# Patient Record
Sex: Male | Born: 1960 | Race: Black or African American | Hispanic: No | Marital: Single | Smoking: Current every day smoker
Health system: Southern US, Community
[De-identification: ages and names within clinical notes are randomized; demographics above are authoritative.]

## PROBLEM LIST (undated history)

## (undated) DIAGNOSIS — K219 Gastro-esophageal reflux disease without esophagitis: Secondary | ICD-10-CM

## (undated) DIAGNOSIS — H5440 Blindness, one eye, unspecified eye: Secondary | ICD-10-CM

## (undated) DIAGNOSIS — N200 Calculus of kidney: Secondary | ICD-10-CM

## (undated) DIAGNOSIS — E7800 Pure hypercholesterolemia, unspecified: Secondary | ICD-10-CM

## (undated) DIAGNOSIS — R531 Weakness: Secondary | ICD-10-CM

## (undated) DIAGNOSIS — I6930 Unspecified sequelae of cerebral infarction: Secondary | ICD-10-CM

## (undated) DIAGNOSIS — E785 Hyperlipidemia, unspecified: Secondary | ICD-10-CM

## (undated) DIAGNOSIS — J449 Chronic obstructive pulmonary disease, unspecified: Secondary | ICD-10-CM

## (undated) DIAGNOSIS — Z8739 Personal history of other diseases of the musculoskeletal system and connective tissue: Secondary | ICD-10-CM

## (undated) DIAGNOSIS — M1990 Unspecified osteoarthritis, unspecified site: Secondary | ICD-10-CM

## (undated) DIAGNOSIS — I639 Cerebral infarction, unspecified: Secondary | ICD-10-CM

## (undated) DIAGNOSIS — G479 Sleep disorder, unspecified: Secondary | ICD-10-CM

## (undated) DIAGNOSIS — I10 Essential (primary) hypertension: Secondary | ICD-10-CM

## (undated) DIAGNOSIS — H5450 Low vision, one eye, unspecified eye: Secondary | ICD-10-CM

## (undated) DIAGNOSIS — H5410 Blindness, one eye, low vision other eye, unspecified eyes: Secondary | ICD-10-CM

## (undated) DIAGNOSIS — C61 Malignant neoplasm of prostate: Secondary | ICD-10-CM

## (undated) DIAGNOSIS — Z87442 Personal history of urinary calculi: Secondary | ICD-10-CM

## (undated) HISTORY — PX: EYE SURGERY: SHX253

## (undated) HISTORY — PX: TOENAIL EXCISION: SUR558

---

## 2001-06-28 ENCOUNTER — Encounter: Payer: Self-pay | Admitting: *Deleted

## 2001-06-28 ENCOUNTER — Emergency Department (HOSPITAL_COMMUNITY): Admission: EM | Admit: 2001-06-28 | Discharge: 2001-06-28 | Payer: Self-pay | Admitting: Emergency Medicine

## 2002-10-12 ENCOUNTER — Encounter: Payer: Self-pay | Admitting: Emergency Medicine

## 2002-10-12 ENCOUNTER — Emergency Department (HOSPITAL_COMMUNITY): Admission: AD | Admit: 2002-10-12 | Discharge: 2002-10-12 | Payer: Self-pay | Admitting: Emergency Medicine

## 2005-03-01 DIAGNOSIS — I6930 Unspecified sequelae of cerebral infarction: Secondary | ICD-10-CM

## 2005-03-01 DIAGNOSIS — R531 Weakness: Secondary | ICD-10-CM

## 2005-03-01 HISTORY — DX: Unspecified sequelae of cerebral infarction: I69.30

## 2005-03-01 HISTORY — DX: Weakness: R53.1

## 2005-03-20 ENCOUNTER — Inpatient Hospital Stay (HOSPITAL_COMMUNITY): Admission: EM | Admit: 2005-03-20 | Discharge: 2005-04-29 | Payer: Self-pay | Admitting: Emergency Medicine

## 2005-03-20 ENCOUNTER — Ambulatory Visit: Payer: Self-pay | Admitting: Physical Medicine & Rehabilitation

## 2006-03-25 ENCOUNTER — Encounter: Admission: RE | Admit: 2006-03-25 | Discharge: 2006-06-10 | Payer: Self-pay | Admitting: Internal Medicine

## 2006-12-22 ENCOUNTER — Encounter: Admission: RE | Admit: 2006-12-22 | Discharge: 2007-02-06 | Payer: Self-pay | Admitting: Internal Medicine

## 2009-07-12 ENCOUNTER — Inpatient Hospital Stay (HOSPITAL_COMMUNITY): Admission: EM | Admit: 2009-07-12 | Discharge: 2009-07-18 | Payer: Self-pay | Admitting: Emergency Medicine

## 2010-09-16 LAB — URINE CULTURE: Colony Count: NO GROWTH

## 2010-09-16 LAB — BASIC METABOLIC PANEL
CO2: 22 mEq/L (ref 19–32)
Calcium: 8 mg/dL — ABNORMAL LOW (ref 8.4–10.5)
Calcium: 8.2 mg/dL — ABNORMAL LOW (ref 8.4–10.5)
Calcium: 8.3 mg/dL — ABNORMAL LOW (ref 8.4–10.5)
Chloride: 100 mEq/L (ref 96–112)
Creatinine, Ser: 5.09 mg/dL — ABNORMAL HIGH (ref 0.4–1.5)
GFR calc Af Amer: 12 mL/min — ABNORMAL LOW (ref >60)
GFR calc Af Amer: 15 mL/min — ABNORMAL LOW (ref >60)
GFR calc Af Amer: 22 mL/min — ABNORMAL LOW (ref >60)
GFR calc non Af Amer: 10 mL/min — ABNORMAL LOW (ref >60)
GFR calc non Af Amer: 18 mL/min — ABNORMAL LOW (ref >60)
Potassium: 4 mEq/L (ref 3.5–5.1)
Sodium: 135 mEq/L (ref 135–145)
Sodium: 135 mEq/L (ref 135–145)
Sodium: 136 mEq/L (ref 135–145)

## 2010-09-16 LAB — URINALYSIS, ROUTINE W REFLEX MICROSCOPIC
Ketones, ur: NEGATIVE mg/dL
Specific Gravity, Urine: 1.022 (ref 1.005–1.030)
pH: 5.5 (ref 5.0–8.0)

## 2010-09-16 LAB — CBC
Hemoglobin: 12.6 g/dL — ABNORMAL LOW (ref 13.0–17.0)
Hemoglobin: 13.5 g/dL (ref 13.0–17.0)
MCHC: 33.5 g/dL (ref 30.0–36.0)
MCV: 93 fL (ref 78.0–100.0)
Platelets: 144 10*3/uL — ABNORMAL LOW (ref 150–400)
RBC: 4.02 MIL/uL — ABNORMAL LOW (ref 4.22–5.81)
RDW: 16.6 % — ABNORMAL HIGH (ref 11.5–15.5)

## 2010-09-16 LAB — DIFFERENTIAL
Monocytes Relative: 2 % — ABNORMAL LOW (ref 3–12)
Neutro Abs: 12.5 10*3/uL — ABNORMAL HIGH (ref 1.7–7.7)
Neutrophils Relative %: 91 % — ABNORMAL HIGH (ref 43–77)

## 2010-09-16 LAB — CULTURE, BLOOD (ROUTINE X 2)
Culture: NO GROWTH
Culture: NO GROWTH

## 2010-09-16 LAB — PROTEIN, URINE, RANDOM: Total Protein, Urine: 51 mg/dL

## 2010-09-16 LAB — NA AND K (SODIUM & POTASSIUM), RAND UR
Potassium Urine: 26 mEq/L
Sodium, Ur: 62 mEq/L

## 2010-09-16 LAB — CREATININE, URINE, RANDOM: Creatinine, Urine: 88.1 mg/dL

## 2010-09-17 LAB — CBC
HCT: 34.6 % — ABNORMAL LOW (ref 39.0–52.0)
Hemoglobin: 11.6 g/dL — ABNORMAL LOW (ref 13.0–17.0)
MCHC: 33.8 g/dL (ref 30.0–36.0)
Platelets: 379 10*3/uL (ref 150–400)
RDW: 16.3 % — ABNORMAL HIGH (ref 11.5–15.5)
WBC: 13.6 10*3/uL — ABNORMAL HIGH (ref 4.0–10.5)
WBC: 15 10*3/uL — ABNORMAL HIGH (ref 4.0–10.5)

## 2010-09-17 LAB — BASIC METABOLIC PANEL
BUN: 21 mg/dL (ref 6–23)
BUN: 39 mg/dL — ABNORMAL HIGH (ref 6–23)
Chloride: 108 mEq/L (ref 96–112)
GFR calc non Af Amer: 29 mL/min — ABNORMAL LOW (ref >60)
Potassium: 3.6 mEq/L (ref 3.5–5.1)
Potassium: 3.7 mEq/L (ref 3.5–5.1)
Sodium: 136 mEq/L (ref 135–145)
Sodium: 138 mEq/L (ref 135–145)

## 2010-11-16 NOTE — H&P (Signed)
[name], [name]               ACCOUNT NO.:  [id number]   MEDICAL RECORD NO.:  [id number]          PATIENT TYPE:  INP   LOCATION:  2105                         FACILITY:  [facility]   PHYSICIAN:  Gustavus Messing. Orlin Hilding, M.D.DATE OF BIRTH:  1960-11-28   DATE OF ADMISSION:  03/20/2005  DATE OF DISCHARGE:                                HISTORY & PHYSICAL   CHIEF COMPLAINT:  Left-sided weakness and headache, onset about 11:30 code  stroke.   HISTORY OF PRESENT ILLNESS:  [name] is a 50 year old right-handed black  man with a history of hypertension no longer treated who was in his usual  state of health today around 11:30 a.m. said he was in the kitchen cooking  when he had sudden onset of right-sided headache, left hemiparesis, slurred  speech, and facial droop.  Review of systems positive for a headache,  negative for chest pain.   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  He  denies diabetes, coronary artery disease, or surgeries.   MEDICATIONS:  None.   ALLERGIES:  None.   SOCIAL HISTORY:  Does smoke at least a pack a day.  Does drink.  Remote drug  use.   FAMILY HISTORY:  Negative for stroke.   PHYSICAL EXAMINATION:  VITAL SIGNS:  Temperature 98.3, blood pressure  185/110, heart rate 77, 98% on 2 L.  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  NECK:  Supple without bruits, but laborious breathing obscures this.  HEART:  Regular rate and rhythm.  ABDOMEN:  Obese.  EXTREMITIES:  Without edema.  NEUROLOGIC:  He scores 21 on the NIHS stroke scale.  Level of consciousness:  He is drowsy.  He answers one out of two questions correctly.  Follows two  out of two commands correctly.  He has a right gaze preference, but can look  to the left.  He has a complete hemianopsia to the left in the visual  fields.  Facial:  He has a fairly complete  central facial droop, not a  peripheral.  Motor examination:  There is no movement left upper extremity,  no drift in the right upper extremity.  Gets four points on the right and  the left upper.  In the lower extremities no movement on the left.  He gets  a 4.  Some effort against gravity on the right.  He gets a 2.  For #7 ataxia  I cannot assess.  At 8 sensory there is a dense loss on the left.  He gets 2  points for that.  Language:  No evidence of an aphasia.  Dysarthria:  He  does have mild to moderate.  He gets a 1.  Neglect:  He does have partial  neglect.  Gets a 1 for that.  His total score is 21.   CT of the brain shows a 3-4 cm right putamenal capsular hemorrhage with some  impingement on the ventricular system which is small.  He does not have  much  in the way of atrophy.  He may have some other small vessel disease.  PT is  13.6, INR 1.  Creatinine 1, sodium 143, potassium 3.3, chloride 106, CO2  29.6, glucose is 100.   IMPRESSION:  Hypertensive right putamenal hemorrhage with deteriorating  status.  Discussed with Dr. Trey [name].  At the moment he is not a candidate  for surgery.  There is no hydrocephalus or herniation.   RECOMMENDATIONS:  Given the recent onset about two and a half hours ago and  the young age with rapid deterioration even though the hemorrhage is only 3-  4 cm I will give him NovoSeven 40 mcg/kg IV.  Will admit him to an ICU.  Full code for now.  There is no family to discuss code status with.  Will  control blood pressure with Cardene drip.  Recheck a CT in the morning.  There may be a role for CT intervention yet.      Catherine A. Orlin Hilding, M.D.  Electronically Signed     CAW/MEDQ  D:  03/20/2005  T:  03/20/2005  Job:  [id number]

## 2010-11-16 NOTE — Discharge Summary (Signed)
[name], [name]               ACCOUNT NO.:  [id number]   MEDICAL RECORD NO.:  [id number]          PATIENT TYPE:  INP   LOCATION:  3017                         FACILITY:  [facility]   PHYSICIAN:  Pramod P. Pearlean Brownie, MD    DATE OF BIRTH:  1960-12-27   DATE OF ADMISSION:  03/20/2005  DATE OF DISCHARGE:  04/29/2005                                 DISCHARGE SUMMARY   ADMITTING DIAGNOSES:  Left-sided weakness and headache.   DISCHARGE DIAGNOSES:  1.  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  2.  Dysphagia secondary to above.  3.  Hypertension.  4.  Obesity.  5.  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   HOSPITAL COURSE:  Kindly see Dr. Stark Klein excellent admission H&P  on March 20, 2005 for details of presentation.  [name] is a 50-year-  old African-American male with untreated hypertension.  Presented with  sudden onset of headache, left-sided weakness, slurred speech, and facial  droop.  Admission CT scan showed a large 3.8 x 3.1 cm right putamen basal  ganglia hemorrhage with midline shift of 2 mm and mass effect.  Patient's  care was discussed with neurosurgeon on-call, Dr. Trey Sailors, and he was not  considered a candidate for surgery.  He was hospitalized to the neurological  intensive care unit where he was kept for several days until he was stable.  Patient had presented within two and a half hours of onset of his symptoms  and was considered at risk for significant neurological worsening if he  rebled and hence he was given NovoSeven 40 mcg/kg IV one-time dose to  prevent rebleed.  He tolerated it without any problems.  His hemorrhage  remained stable.  He had several CT scans done serially at regular intervals  to document his stability of his hemorrhage.  He had significant dysarthria  and dysphagia and left-sided weakness.  He was seen on consultation by  physical, occupational, and speech therapy.  He had significant persistent  dysphagia.  He was  initially fed with a Panda tube but since his dysphagia  persisted beyond two weeks he was subsequently had PEG tube inserted for  feeding purposes.  The procedure was uneventful.  His blood pressure  remained high initially and it was treated with aggressive medications.  He  was seen on consult by PT, OT, and speech.  The patient was not considered a  good candidate for rehabilitation due to not having 24-hour supervision at  home.  He was considered for nursing home placement but given his young age  it took a long time to find a nursing home.  He remained stable throughout  the hospitalization stay.  At the time of discharge he was awake, alert.  His dysarthria had improved.  He still had facial droop.  He had grade 2/5  strength in the left upper extremity predominantly at the shoulders as well  as in the legs.  He has sensory loss on the left side.  He was discharged  home in stable condition to [facility] [place]   DISCHARGE  MEDICATIONS:  1.  Multivitamin once a day.  2.  Senokot twice a day.  3.  Potassium 20 mEq daily.  4.  Hydrochlorothiazide 50 mg a day.  5.  Clonidine 0.2 mg twice a day.  6.  Lovenox 40 mg q.24h. for DVT prophylaxis.  7.  Ensure 237 mL three times a day for nutritional supplement.  8.  Ultram 100 mg q.6h. p.r.n. for pain.  9.  Reglan 10 mg q.8h. for nausea and vomiting.  10. Tylenol p.r.n. for fever or pain.   He was advised to follow up with his medical doctor and with Annie Main,  nurse practitioner in Dr. Marlis Edelson office in two months.           ______________________________  Sunny Schlein. Pearlean Brownie, MD     PPS/MEDQ  D:  04/29/2005  T:  04/29/2005  Job:  [id number]

## 2012-08-19 ENCOUNTER — Other Ambulatory Visit (HOSPITAL_COMMUNITY): Payer: Self-pay | Admitting: Urology

## 2012-08-19 DIAGNOSIS — N200 Calculus of kidney: Secondary | ICD-10-CM

## 2012-08-25 ENCOUNTER — Encounter (HOSPITAL_COMMUNITY)
Admission: RE | Admit: 2012-08-25 | Discharge: 2012-08-25 | Disposition: A | Discharge status: Home / Self Care | Payer: Medicaid Other | Source: Ambulatory Visit | Attending: Urology | Admitting: Urology

## 2012-08-25 DIAGNOSIS — Z01818 Encounter for other preprocedural examination: Secondary | ICD-10-CM | POA: Insufficient documentation

## 2012-08-25 DIAGNOSIS — N200 Calculus of kidney: Secondary | ICD-10-CM | POA: Insufficient documentation

## 2012-08-25 MED ORDER — FUROSEMIDE 10 MG/ML IJ SOLN
40.0000 mg | Freq: Once | INTRAMUSCULAR | Status: AC
  Administered 2012-08-25: 41 mg via INTRAVENOUS
  Filled 2012-08-25: qty 4

## 2012-08-25 MED ORDER — TECHNETIUM TC 99M MERTIATIDE
15.0000 | Freq: Once | INTRAVENOUS | Status: AC | PRN
  Administered 2012-08-25: 15 via INTRAVENOUS

## 2012-09-07 ENCOUNTER — Other Ambulatory Visit: Payer: Self-pay | Admitting: Urology

## 2012-09-21 ENCOUNTER — Encounter (HOSPITAL_COMMUNITY): Payer: Self-pay | Admitting: Pharmacy Technician

## 2012-09-28 ENCOUNTER — Other Ambulatory Visit (HOSPITAL_COMMUNITY): Payer: Self-pay | Admitting: *Deleted

## 2012-09-29 ENCOUNTER — Encounter (HOSPITAL_COMMUNITY): Payer: Self-pay

## 2012-09-29 ENCOUNTER — Ambulatory Visit (HOSPITAL_COMMUNITY)
Admission: RE | Admit: 2012-09-29 | Discharge: 2012-09-29 | Disposition: A | Discharge status: Home / Self Care | Payer: Medicaid Other | Source: Ambulatory Visit | Attending: Urology | Admitting: Urology

## 2012-09-29 ENCOUNTER — Encounter (HOSPITAL_COMMUNITY)
Admission: RE | Admit: 2012-09-29 | Discharge: 2012-09-29 | Disposition: A | Discharge status: Home / Self Care | Payer: Medicaid Other | Source: Ambulatory Visit | Attending: Urology | Admitting: Urology

## 2012-09-29 DIAGNOSIS — Z01818 Encounter for other preprocedural examination: Secondary | ICD-10-CM | POA: Insufficient documentation

## 2012-09-29 DIAGNOSIS — Z87891 Personal history of nicotine dependence: Secondary | ICD-10-CM | POA: Insufficient documentation

## 2012-09-29 DIAGNOSIS — I10 Essential (primary) hypertension: Secondary | ICD-10-CM | POA: Insufficient documentation

## 2012-09-29 DIAGNOSIS — Z01812 Encounter for preprocedural laboratory examination: Secondary | ICD-10-CM | POA: Insufficient documentation

## 2012-09-29 DIAGNOSIS — J4489 Other specified chronic obstructive pulmonary disease: Secondary | ICD-10-CM | POA: Insufficient documentation

## 2012-09-29 DIAGNOSIS — J449 Chronic obstructive pulmonary disease, unspecified: Secondary | ICD-10-CM | POA: Insufficient documentation

## 2012-09-29 DIAGNOSIS — M4120 Other idiopathic scoliosis, site unspecified: Secondary | ICD-10-CM | POA: Insufficient documentation

## 2012-09-29 HISTORY — DX: Chronic obstructive pulmonary disease, unspecified: J44.9

## 2012-09-29 HISTORY — DX: Low vision, one eye, unspecified eye: H54.50

## 2012-09-29 HISTORY — DX: Essential (primary) hypertension: I10

## 2012-09-29 HISTORY — DX: Blindness, one eye, low vision other eye, unspecified eyes: H54.10

## 2012-09-29 HISTORY — DX: Cerebral infarction, unspecified: I63.9

## 2012-09-29 HISTORY — DX: Unspecified osteoarthritis, unspecified site: M19.90

## 2012-09-29 HISTORY — DX: Pure hypercholesterolemia, unspecified: E78.00

## 2012-09-29 HISTORY — DX: Blindness, one eye, unspecified eye: H54.40

## 2012-09-29 LAB — BASIC METABOLIC PANEL
BUN: 13 mg/dL (ref 6–23)
CO2: 30 mEq/L (ref 19–32)
GFR calc non Af Amer: 74 mL/min — ABNORMAL LOW (ref >90)
Glucose, Bld: 96 mg/dL (ref 70–99)
Potassium: 4.9 mEq/L (ref 3.5–5.1)
Sodium: 142 mEq/L (ref 135–145)

## 2012-09-29 LAB — CBC
HCT: 47.8 % (ref 39.0–52.0)
Hemoglobin: 15.6 g/dL (ref 13.0–17.0)
MCH: 30.1 pg (ref 26.0–34.0)
MCHC: 32.6 g/dL (ref 30.0–36.0)
MCV: 92.1 fL (ref 78.0–100.0)
RBC: 5.19 MIL/uL (ref 4.22–5.81)

## 2012-09-29 LAB — SURGICAL PCR SCREEN: MRSA, PCR: NEGATIVE

## 2012-09-29 NOTE — Progress Notes (Signed)
Faxed blood refusal to [facility] fax cofirmation received

## 2012-09-29 NOTE — Progress Notes (Addendum)
Medical clearance note dr Mila Palmer on chart Requested ekg dr Mila Palmer

## 2012-09-29 NOTE — Patient Instructions (Addendum)
20 [name]  09/29/2012   Your procedure is scheduled on: 10-05-2012  Report to [facility] at 0830 AM.  Call this number if you have problems the morning of surgery [phone]   Remember: call dr Concepcion Elk to get him to fax ekg to [phone] attention Prist[facility]   Do not eat food or drink liquids :After Midnight.     Take these medicines the morning of surgery with A SIP OF WATER: allopurinol, baclofen, certizine, proair nhaler if nneded and leave with your home health aide, omeprazole                                SEE [facility] PREPARING FOR SURGERY SHEET   Do not wear jewelry, make-up or nail polish.  Do not wear lotions, powders, or perfumes. You may wear deodorant.   Men may shave face and neck.  Do not bring valuables to the hospital.  Contacts, dentures or bridgework may not be worn into surgery.  Leave suitcase in the car. After surgery it may be brought to your room.  For patients admitted to the hospital, checkout time is 11:00 AM the day of discharge.   Patients discharged the day of surgery will not be allowed to drive home.  Name and phone number of your driver:  Special Instructions: N/A   Please read over the following fact sheets that you were given: MRSA Information.  Call Cain Sieve RN pre op nurse if needed [phone]    FAILURE TO FOLLOW THESE INSTRUCTIONS MAY RESULT IN THE CANCELLATION OF YOUR SURGERY. PATIENT SIGNATURE___________________________________________

## 2012-09-30 NOTE — Progress Notes (Signed)
ekg 09-02-2012 dr Concepcion Elk on chart

## 2012-10-05 ENCOUNTER — Encounter (HOSPITAL_COMMUNITY): Payer: Self-pay | Admitting: Registered Nurse

## 2012-10-05 ENCOUNTER — Ambulatory Visit (HOSPITAL_COMMUNITY): Payer: Medicaid Other | Admitting: Registered Nurse

## 2012-10-05 ENCOUNTER — Encounter (HOSPITAL_COMMUNITY)
Admission: RE | Disposition: A | Discharge status: Home / Self Care | Payer: Self-pay | Source: Ambulatory Visit | Attending: Urology

## 2012-10-05 ENCOUNTER — Ambulatory Visit (HOSPITAL_COMMUNITY): Payer: Medicaid Other

## 2012-10-05 ENCOUNTER — Inpatient Hospital Stay (HOSPITAL_COMMUNITY)
Admission: RE | Admit: 2012-10-05 | Discharge: 2012-10-08 | DRG: 660 | Disposition: A | Discharge status: Home / Self Care | Payer: Medicaid Other | Source: Ambulatory Visit | Attending: Urology | Admitting: Urology

## 2012-10-05 ENCOUNTER — Encounter (HOSPITAL_COMMUNITY): Payer: Self-pay | Admitting: *Deleted

## 2012-10-05 DIAGNOSIS — H547 Unspecified visual loss: Secondary | ICD-10-CM | POA: Diagnosis present

## 2012-10-05 DIAGNOSIS — N200 Calculus of kidney: Principal | ICD-10-CM | POA: Diagnosis present

## 2012-10-05 DIAGNOSIS — Z79899 Other long term (current) drug therapy: Secondary | ICD-10-CM

## 2012-10-05 DIAGNOSIS — Z87440 Personal history of urinary (tract) infections: Secondary | ICD-10-CM

## 2012-10-05 DIAGNOSIS — I10 Essential (primary) hypertension: Secondary | ICD-10-CM | POA: Diagnosis present

## 2012-10-05 DIAGNOSIS — J4489 Other specified chronic obstructive pulmonary disease: Secondary | ICD-10-CM | POA: Diagnosis present

## 2012-10-05 DIAGNOSIS — R5381 Other malaise: Secondary | ICD-10-CM | POA: Diagnosis present

## 2012-10-05 DIAGNOSIS — I69998 Other sequelae following unspecified cerebrovascular disease: Secondary | ICD-10-CM

## 2012-10-05 DIAGNOSIS — E860 Dehydration: Secondary | ICD-10-CM | POA: Diagnosis present

## 2012-10-05 DIAGNOSIS — E7800 Pure hypercholesterolemia, unspecified: Secondary | ICD-10-CM | POA: Diagnosis present

## 2012-10-05 DIAGNOSIS — F17200 Nicotine dependence, unspecified, uncomplicated: Secondary | ICD-10-CM | POA: Diagnosis present

## 2012-10-05 DIAGNOSIS — J449 Chronic obstructive pulmonary disease, unspecified: Secondary | ICD-10-CM | POA: Diagnosis present

## 2012-10-05 DIAGNOSIS — N1330 Unspecified hydronephrosis: Secondary | ICD-10-CM | POA: Diagnosis present

## 2012-10-05 HISTORY — PX: NEPHROLITHOTOMY: SHX5134

## 2012-10-05 HISTORY — PX: CYSTOSCOPY: SHX5120

## 2012-10-05 LAB — BASIC METABOLIC PANEL
CO2: 24 mEq/L (ref 19–32)
Chloride: 102 mEq/L (ref 96–112)
Creatinine, Ser: 0.97 mg/dL (ref 0.50–1.35)
GFR calc Af Amer: >90 mL/min (ref >90)
Potassium: 3.9 mEq/L (ref 3.5–5.1)
Sodium: 136 mEq/L (ref 135–145)

## 2012-10-05 LAB — HEMOGLOBIN AND HEMATOCRIT, BLOOD
HCT: 44.6 % (ref 39.0–52.0)
Hemoglobin: 15.1 g/dL (ref 13.0–17.0)

## 2012-10-05 LAB — NO BLOOD PRODUCTS

## 2012-10-05 SURGERY — NEPHROLITHOTOMY PERCUTANEOUS
Anesthesia: General | Laterality: Right | Wound class: Clean Contaminated

## 2012-10-05 MED ORDER — NEOSTIGMINE METHYLSULFATE 1 MG/ML IJ SOLN
INTRAMUSCULAR | Status: DC | PRN
  Administered 2012-10-05: 5 mg via INTRAVENOUS

## 2012-10-05 MED ORDER — IOHEXOL 300 MG/ML  SOLN
INTRAMUSCULAR | Status: AC
  Filled 2012-10-05: qty 2

## 2012-10-05 MED ORDER — OXYCODONE HCL 5 MG PO TABS
5.0000 mg | ORAL_TABLET | ORAL | Status: DC | PRN
  Administered 2012-10-08: 5 mg via ORAL
  Filled 2012-10-05: qty 1

## 2012-10-05 MED ORDER — FENTANYL CITRATE 0.05 MG/ML IJ SOLN
INTRAMUSCULAR | Status: DC | PRN
  Administered 2012-10-05: 100 ug via INTRAVENOUS
  Administered 2012-10-05 (×3): 50 ug via INTRAVENOUS

## 2012-10-05 MED ORDER — DOCUSATE SODIUM 100 MG PO CAPS
100.0000 mg | ORAL_CAPSULE | Freq: Two times a day (BID) | ORAL | Status: DC
  Administered 2012-10-05 – 2012-10-08 (×5): 100 mg via ORAL
  Filled 2012-10-05 (×8): qty 1

## 2012-10-05 MED ORDER — ALLOPURINOL 300 MG PO TABS
300.0000 mg | ORAL_TABLET | Freq: Every morning | ORAL | Status: DC
  Administered 2012-10-06 – 2012-10-08 (×2): 300 mg via ORAL
  Filled 2012-10-05 (×3): qty 1

## 2012-10-05 MED ORDER — SENNA 8.6 MG PO TABS
1.0000 | ORAL_TABLET | Freq: Two times a day (BID) | ORAL | Status: DC
  Administered 2012-10-05 – 2012-10-08 (×5): 8.6 mg via ORAL
  Filled 2012-10-05 (×5): qty 1

## 2012-10-05 MED ORDER — ONDANSETRON HCL 4 MG/2ML IJ SOLN
INTRAMUSCULAR | Status: DC | PRN
  Administered 2012-10-05: 4 mg via INTRAVENOUS

## 2012-10-05 MED ORDER — PROPOFOL 10 MG/ML IV BOLUS
INTRAVENOUS | Status: DC | PRN
  Administered 2012-10-05: 200 mg via INTRAVENOUS

## 2012-10-05 MED ORDER — ACETAMINOPHEN 10 MG/ML IV SOLN
1000.0000 mg | Freq: Four times a day (QID) | INTRAVENOUS | Status: AC
  Administered 2012-10-05 – 2012-10-06 (×4): 1000 mg via INTRAVENOUS
  Filled 2012-10-05 (×4): qty 100

## 2012-10-05 MED ORDER — CIPROFLOXACIN IN D5W 400 MG/200ML IV SOLN
INTRAVENOUS | Status: AC
  Filled 2012-10-05: qty 200

## 2012-10-05 MED ORDER — KCL IN DEXTROSE-NACL 20-5-0.45 MEQ/L-%-% IV SOLN
INTRAVENOUS | Status: DC
  Administered 2012-10-05 – 2012-10-08 (×6): via INTRAVENOUS
  Filled 2012-10-05 (×7): qty 1000

## 2012-10-05 MED ORDER — HYDROMORPHONE HCL PF 1 MG/ML IJ SOLN
0.5000 mg | INTRAMUSCULAR | Status: DC | PRN
  Administered 2012-10-06 – 2012-10-07 (×4): 1 mg via INTRAVENOUS
  Filled 2012-10-05 (×5): qty 1

## 2012-10-05 MED ORDER — PNEUMOCOCCAL VAC POLYVALENT 25 MCG/0.5ML IJ INJ
0.5000 mL | INJECTION | INTRAMUSCULAR | Status: AC
  Administered 2012-10-06: 0.5 mL via INTRAMUSCULAR
  Filled 2012-10-05 (×2): qty 0.5

## 2012-10-05 MED ORDER — SIMVASTATIN 20 MG PO TABS
20.0000 mg | ORAL_TABLET | Freq: Every day | ORAL | Status: DC
  Administered 2012-10-05 – 2012-10-07 (×3): 20 mg via ORAL
  Filled 2012-10-05 (×4): qty 1

## 2012-10-05 MED ORDER — DEXAMETHASONE SODIUM PHOSPHATE 10 MG/ML IJ SOLN
INTRAMUSCULAR | Status: DC | PRN
  Administered 2012-10-05: 10 mg via INTRAVENOUS

## 2012-10-05 MED ORDER — HYDROMORPHONE HCL PF 1 MG/ML IJ SOLN
INTRAMUSCULAR | Status: AC
  Administered 2012-10-05: 1 mg via INTRAVENOUS
  Filled 2012-10-05: qty 1

## 2012-10-05 MED ORDER — LORATADINE 10 MG PO TABS: 10.0000 mg | ORAL_TABLET | Freq: Every day | ORAL | Status: DC

## 2012-10-05 MED ORDER — IOHEXOL 300 MG/ML  SOLN
INTRAMUSCULAR | Status: AC
  Filled 2012-10-05: qty 1

## 2012-10-05 MED ORDER — COLCHICINE 0.6 MG PO TABS
0.6000 mg | ORAL_TABLET | Freq: Every day | ORAL | Status: DC
  Administered 2012-10-05 – 2012-10-08 (×3): 0.6 mg via ORAL
  Filled 2012-10-05 (×4): qty 1

## 2012-10-05 MED ORDER — HYDROCHLOROTHIAZIDE 12.5 MG PO CAPS
12.5000 mg | ORAL_CAPSULE | Freq: Every day | ORAL | Status: DC
  Administered 2012-10-05 – 2012-10-08 (×3): 12.5 mg via ORAL
  Filled 2012-10-05 (×4): qty 1

## 2012-10-05 MED ORDER — STERILE WATER FOR IRRIGATION IR SOLN
Status: DC | PRN
  Administered 2012-10-05: 3000 mL

## 2012-10-05 MED ORDER — DEXTROSE 5 % IV SOLN
1.0000 g | INTRAVENOUS | Status: DC
  Administered 2012-10-06 – 2012-10-07 (×2): 1 g via INTRAVENOUS
  Filled 2012-10-05 (×3): qty 10

## 2012-10-05 MED ORDER — GLYCOPYRROLATE 0.2 MG/ML IJ SOLN
INTRAMUSCULAR | Status: DC | PRN
  Administered 2012-10-05: .8 mg via INTRAVENOUS

## 2012-10-05 MED ORDER — ALBUTEROL SULFATE HFA 108 (90 BASE) MCG/ACT IN AERS: 2.0000 | INHALATION_SPRAY | RESPIRATORY_TRACT | Status: DC | PRN

## 2012-10-05 MED ORDER — BACLOFEN 10 MG PO TABS
10.0000 mg | ORAL_TABLET | Freq: Three times a day (TID) | ORAL | Status: DC
  Administered 2012-10-05 – 2012-10-08 (×7): 10 mg via ORAL
  Filled 2012-10-05 (×10): qty 1

## 2012-10-05 MED ORDER — ROCURONIUM BROMIDE 100 MG/10ML IV SOLN
INTRAVENOUS | Status: DC | PRN
  Administered 2012-10-05: 30 mg via INTRAVENOUS
  Administered 2012-10-05: 50 mg via INTRAVENOUS

## 2012-10-05 MED ORDER — CIPROFLOXACIN IN D5W 400 MG/200ML IV SOLN
400.0000 mg | INTRAVENOUS | Status: AC
  Administered 2012-10-05: 400 mg via INTRAVENOUS

## 2012-10-05 MED ORDER — EPHEDRINE SULFATE 50 MG/ML IJ SOLN
INTRAMUSCULAR | Status: DC | PRN
  Administered 2012-10-05: 5 mg via INTRAVENOUS

## 2012-10-05 MED ORDER — ACETAMINOPHEN 10 MG/ML IV SOLN
INTRAVENOUS | Status: DC | PRN
  Administered 2012-10-05: 1000 mg via INTRAVENOUS

## 2012-10-05 MED ORDER — PHENYLEPHRINE HCL 10 MG/ML IJ SOLN
INTRAMUSCULAR | Status: DC | PRN
  Administered 2012-10-05 (×2): 40 ug via INTRAVENOUS

## 2012-10-05 MED ORDER — CETIRIZINE HCL 1 MG/ML PO SYRP: 10.0000 mg | ORAL_SOLUTION | Freq: Every morning | ORAL | Status: DC

## 2012-10-05 MED ORDER — MIDAZOLAM HCL 5 MG/5ML IJ SOLN
INTRAMUSCULAR | Status: DC | PRN
  Administered 2012-10-05: 2 mg via INTRAVENOUS

## 2012-10-05 MED ORDER — LACTATED RINGERS IV SOLN
INTRAVENOUS | Status: DC | PRN
  Administered 2012-10-05: 10:00:00 via INTRAVENOUS

## 2012-10-05 MED ORDER — HYDROMORPHONE HCL PF 1 MG/ML IJ SOLN
0.2500 mg | INTRAMUSCULAR | Status: DC | PRN
  Administered 2012-10-05 (×2): 0.5 mg via INTRAVENOUS

## 2012-10-05 MED ORDER — PANTOPRAZOLE SODIUM 40 MG PO TBEC
40.0000 mg | DELAYED_RELEASE_TABLET | Freq: Every day | ORAL | Status: DC
  Administered 2012-10-05 – 2012-10-08 (×3): 40 mg via ORAL
  Filled 2012-10-05 (×4): qty 1

## 2012-10-05 MED ORDER — SODIUM CHLORIDE 0.9 % IR SOLN
Status: DC | PRN
  Administered 2012-10-05: 12000 mL

## 2012-10-05 MED ORDER — KETOROLAC TROMETHAMINE 30 MG/ML IJ SOLN: 15.0000 mg | Freq: Once | INTRAMUSCULAR | Status: DC | PRN

## 2012-10-05 MED ORDER — LIDOCAINE HCL (CARDIAC) 20 MG/ML IV SOLN
INTRAVENOUS | Status: DC | PRN
  Administered 2012-10-05: 100 mg via INTRAVENOUS

## 2012-10-05 MED ORDER — IRBESARTAN 300 MG PO TABS
300.0000 mg | ORAL_TABLET | Freq: Every day | ORAL | Status: DC
  Administered 2012-10-05 – 2012-10-08 (×3): 300 mg via ORAL
  Filled 2012-10-05 (×4): qty 1

## 2012-10-05 MED ORDER — PROMETHAZINE HCL 25 MG/ML IJ SOLN: 6.2500 mg | INTRAMUSCULAR | Status: DC | PRN

## 2012-10-05 MED ORDER — INDIGOTINDISULFONATE SODIUM 8 MG/ML IJ SOLN
INTRAMUSCULAR | Status: AC
  Filled 2012-10-05: qty 5

## 2012-10-05 MED ORDER — DEXTROSE 5 % IV SOLN
1.0000 g | Freq: Once | INTRAVENOUS | Status: AC
  Administered 2012-10-05: 1 g via INTRAVENOUS
  Filled 2012-10-05: qty 10

## 2012-10-05 MED ORDER — ONDANSETRON HCL 4 MG/2ML IJ SOLN
4.0000 mg | INTRAMUSCULAR | Status: DC | PRN
  Administered 2012-10-07: 4 mg via INTRAVENOUS
  Filled 2012-10-05 (×2): qty 2

## 2012-10-05 MED ORDER — CETIRIZINE HCL 10 MG PO TABS
10.0000 mg | ORAL_TABLET | Freq: Every day | ORAL | Status: DC
  Administered 2012-10-06 – 2012-10-08 (×2): 10 mg via ORAL
  Filled 2012-10-05 (×4): qty 1

## 2012-10-05 MED ORDER — VALSARTAN-HYDROCHLOROTHIAZIDE 320-12.5 MG PO TABS: 1.0000 | ORAL_TABLET | Freq: Every day | ORAL | Status: DC

## 2012-10-05 MED ORDER — ACETAMINOPHEN 10 MG/ML IV SOLN
INTRAVENOUS | Status: AC
  Filled 2012-10-05: qty 100

## 2012-10-05 MED ORDER — IOHEXOL 300 MG/ML  SOLN
INTRAMUSCULAR | Status: DC | PRN
  Administered 2012-10-05: 10 mL

## 2012-10-05 SURGICAL SUPPLY — 62 items
APL SKNCLS STERI-STRIP NONHPOA (GAUZE/BANDAGES/DRESSINGS) ×2
BAG URINE DRAINAGE (UROLOGICAL SUPPLIES) ×3 IMPLANT
BAG URO CATCHER STRL LF (DRAPE) ×3 IMPLANT
BASKET ZERO TIP NITINOL 2.4FR (BASKET) ×3 IMPLANT
BENZOIN TINCTURE PRP APPL 2/3 (GAUZE/BANDAGES/DRESSINGS) ×5 IMPLANT
BLADE SURG 15 STRL LF DISP TIS (BLADE) ×2 IMPLANT
BLADE SURG 15 STRL SS (BLADE) ×3
BSKT STON RTRVL ZERO TP 2.4FR (BASKET) ×2
CATCHER STONE W/TUBE ADAPTER (UROLOGICAL SUPPLIES) ×2 IMPLANT
CATH BEACON 5.038 65CM KMP-01 (CATHETERS) ×2 IMPLANT
CATH FOLEY 2W COUNCIL 20FR 5CC (CATHETERS) IMPLANT
CATH FOLEY 2WAY SLVR  5CC 16FR (CATHETERS) ×1
CATH FOLEY 2WAY SLVR 5CC 16FR (CATHETERS) ×2 IMPLANT
CATH INTERMIT  6FR 70CM (CATHETERS) ×3 IMPLANT
CATH MULTI PURPOSE 16FR DRAIN (STENTS) ×1 IMPLANT
CATH ROBINSON RED A/P 20FR (CATHETERS) IMPLANT
CATH X-FORCE N30 NEPHROSTOMY (TUBING) ×3 IMPLANT
CLOTH BEACON ORANGE TIMEOUT ST (SAFETY) ×3 IMPLANT
COVER SURGICAL LIGHT HANDLE (MISCELLANEOUS) ×3 IMPLANT
DRAPE C-ARM 42X72 X-RAY (DRAPES) ×3 IMPLANT
DRAPE CAMERA CLOSED 9X96 (DRAPES) ×6 IMPLANT
DRAPE LG THREE QUARTER DISP (DRAPES) ×1 IMPLANT
DRAPE LINGEMAN PERC (DRAPES) ×3 IMPLANT
DRAPE SURG IRRIG POUCH 19X23 (DRAPES) ×3 IMPLANT
DRSG PAD ABDOMINAL 8X10 ST (GAUZE/BANDAGES/DRESSINGS) ×1 IMPLANT
DRSG TEGADERM 8X12 (GAUZE/BANDAGES/DRESSINGS) ×5 IMPLANT
GLOVE BIOGEL M 8.0 STRL (GLOVE) ×3 IMPLANT
GLOVE BIOGEL M STRL SZ7.5 (GLOVE) ×3 IMPLANT
GOWN STRL NON-REIN LRG LVL3 (GOWN DISPOSABLE) ×6 IMPLANT
GOWN STRL REIN XL XLG (GOWN DISPOSABLE) ×9 IMPLANT
GUIDEWIRE AMPLAZ .035X145 (WIRE) ×3 IMPLANT
GUIDEWIRE ANG ZIPWIRE 038X150 (WIRE) ×1 IMPLANT
KIT BASIN OR (CUSTOM PROCEDURE TRAY) ×3 IMPLANT
LASER FIBER DISP (UROLOGICAL SUPPLIES) ×1 IMPLANT
LASER FIBER DISP 1000U (UROLOGICAL SUPPLIES) IMPLANT
MANIFOLD NEPTUNE II (INSTRUMENTS) ×3 IMPLANT
NDL TROCAR 18X15 ECHO (NEEDLE) IMPLANT
NEEDLE TROCAR 18X15 ECHO (NEEDLE) ×3 IMPLANT
NS IRRIG 1000ML POUR BTL (IV SOLUTION) ×2 IMPLANT
PACK BASIC VI WITH GOWN DISP (CUSTOM PROCEDURE TRAY) ×3 IMPLANT
PACK CYSTO (CUSTOM PROCEDURE TRAY) ×3 IMPLANT
PAD ABD 7.5X8 STRL (GAUZE/BANDAGES/DRESSINGS) ×6 IMPLANT
PROBE LITHOCLAST ULTRA 3.8X403 (UROLOGICAL SUPPLIES) IMPLANT
PROBE PNEUMATIC 1.0MMX570MM (UROLOGICAL SUPPLIES) ×2 IMPLANT
SET IRRIG Y TYPE TUR BLADDER L (SET/KITS/TRAYS/PACK) ×3 IMPLANT
SET WARMING FLUID IRRIGATION (MISCELLANEOUS) ×2 IMPLANT
SHEATH PEELAWAY SET 9 (SHEATH) ×1 IMPLANT
SPONGE GAUZE 4X4 12PLY (GAUZE/BANDAGES/DRESSINGS) ×3 IMPLANT
SPONGE LAP 4X18 X RAY DECT (DISPOSABLE) ×3 IMPLANT
STONE CATCHER W/TUBE ADAPTER (UROLOGICAL SUPPLIES) IMPLANT
SUT ETHILON 3 0 PS 1 (SUTURE) ×1 IMPLANT
SUT SILK 2 0 30  PSL (SUTURE) ×1
SUT SILK 2 0 30 PSL (SUTURE) ×2 IMPLANT
SUT VIC AB 2-0 UR6 27 (SUTURE) ×1 IMPLANT
SUT VICRYL 0 UR6 27IN ABS (SUTURE) ×1 IMPLANT
SYR 20CC LL (SYRINGE) ×6 IMPLANT
SYRINGE 10CC LL (SYRINGE) ×3 IMPLANT
TRAY FOLEY BAG SILVER LF 14FR (CATHETERS) ×2 IMPLANT
TRAY FOLEY CATH 14FRSI W/METER (CATHETERS) ×3 IMPLANT
TUBE CONNECTING VINYL 14FR 30C (MISCELLANEOUS) ×1 IMPLANT
TUBING CONNECTING 10 (TUBING) ×8 IMPLANT
WATER STERILE IRR 1500ML POUR (IV SOLUTION) ×2 IMPLANT

## 2012-10-05 NOTE — H&P (Signed)
[name] is an 52 y.o. male.    Chief Complaint: Pre-Op Rt Multistage  Percutanoues Nephrostolithotomy   HPI:   1 - Rt Staghorn Kidney Stone - Pt wtih approx 4cm volume of Rt partial staghorn stone, 500HU, SSD 11 cm involving rt mid and lower poles by CT 07/2012 found on w/u of recurrent UTI. No prior stone episodes.  2 - Recurrent UTI - pt with multiple recent UTI's. Partial staghorn stone as per above. 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 PVR 08/2012 "44mL".  Most Recent UCX's with e. coli and strep sens to cephalosporins (Keflex, Rocephin), but variably sensitive to others.  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 No interval fevers. He took CX-specific ABX as prescribed.  Past Medical History  Diagnosis Date  . COPD (chronic obstructive pulmonary disease)   . Hypertension   . Stroke sept 26,  20006     hx mini strokes in past, 1 large stroke  . Arthritis   . High cholesterol   . 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 Left ey surgery  age 72    No family history on file. Social History:  reports that he has been smoking Cigarettes.  He has a 7.5 pack-year smoking history. He has never used smokeless tobacco. He reports that he does not drink alcohol or use illicit drugs.  Allergies: No Known Allergies  No prescriptions prior to admission    No results found for this or any previous visit (from the past 48 hour(s)). No results found.  Review of Systems  Constitutional: Negative.  Negative for fever and chills.  HENT: Negative.   Eyes: Negative.   Respiratory: Negative.   Cardiovascular: Negative.   Gastrointestinal: Negative.   Genitourinary: Negative.   Musculoskeletal: Negative.   Skin: Negative.   Neurological: Negative.    Endo/Heme/Allergies: Negative.   Psychiatric/Behavioral: Negative.     There were no vitals taken for this visit. Physical Exam  Constitutional: He is oriented to person, place, and time. He appears well-developed and well-nourished.  obese  HENT:  Head: Normocephalic and atraumatic.  Eyes: EOM are normal. Pupils are equal, round, and reactive to light.  Neck: Normal range of motion. Neck supple.  Cardiovascular: Normal rate.   Respiratory: Effort normal.  GI: Soft. Bowel sounds are normal.  Genitourinary: Penis normal.  No CVAT  Musculoskeletal: Normal range of motion.  Neurological: He is alert and oriented to person, place, and time.  Skin: Skin is warm and dry.  Psychiatric: He has a normal mood and affect. His behavior is normal. Judgment and thought content normal.     Assessment/Plan   1 - Rt Staghorn Kidney Stone -  We re-discussed percutaneous nephrostolithotomy (PCNL) in detail including need for percutaneous access which is sometimes gained by the surgeon, and other times by radiology or through existing nephrostomy tubes if present. We specifically discussed that often times tubes remain in after surgery until we are confident all stone has been treated. We mentioned that staged surgery is needed in over 50% of cases of very large or complex stone. We then discussed general risks including bleeding, infection, damage to kidney / ureter / bladder, loss of kidney, as well as anesthetic    2 -  Recurrent UTI - stone as potential nidus. Will place on peri-op Rocephin according to most recent CX's.   Eulonda Andalon 10/05/2012, 7:26 AM

## 2012-10-05 NOTE — Brief Op Note (Signed)
10/05/2012  2:23 PM  PATIENT:  [name]  52 y.o. male  PRE-OPERATIVE DIAGNOSIS:  RIGHT STAGHORN KIDNEY STONE  POST-OPERATIVE DIAGNOSIS:  RIGHT STAGHORN KIDNEY STONE  PROCEDURE:  Procedure(s) with comments: NEPHROLITHOTOMY PERCUTANEOUS (Right) - WITH CYSTO AND SURGEON ACCESS  CYSTOSCOPY, retrograde and right stent placement (N/A)  SURGEON:  Surgeon(s) and Role:    * [name], MD - Primary  PHYSICIAN ASSISTANT:   ASSISTANTS: none   ANESTHESIA:   general  EBL:  Total I/O In: 2000 [I.V.:2000] Out: 100 [Blood:100]  BLOOD ADMINISTERED:none  DRAINS: 1 - Foley, 2 - Rt nephrostomy, 3 - Rt nephroureteral stent (capped)   LOCAL MEDICATIONS USED:  NONE  SPECIMEN:  Source of Specimen:  Rt Renal Stones  DISPOSITION OF SPECIMEN:  1 - to patient, 2 - [facility] for Compositional Analysis  COUNTS:  YES  TOURNIQUET:  * No tourniquets in log *  DICTATION: .Other Dictation: Dictation Number [id number]  PLAN OF CARE: Admit to inpatient   PATIENT DISPOSITION:  PACU - hemodynamically stable.   Delay start of Pharmacological VTE agent (>24hrs) due to surgical blood loss or risk of bleeding: yes

## 2012-10-05 NOTE — Preoperative (Signed)
Beta Blockers   Reason not to administer Beta Blockers:Not Applicable 

## 2012-10-05 NOTE — Anesthesia Preprocedure Evaluation (Addendum)
Anesthesia Evaluation  Patient identified by MRN, date of birth, ID band Patient awake    Reviewed: Allergy & Precautions, H&P , NPO status , Patient's Chart, lab work & pertinent test results  Airway Mallampati: III TM Distance: <3 FB Neck ROM: Full    Dental no notable dental hx.    Pulmonary COPD COPD inhaler, Current Smoker,  breath sounds clear to auscultation  Pulmonary exam normal       Cardiovascular hypertension, Pt. on medications Rhythm:Regular Rate:Normal     Neuro/Psych CVA negative psych ROS   GI/Hepatic negative GI ROS, Neg liver ROS,   Endo/Other  negative endocrine ROS  Renal/GU negative Renal ROS  negative genitourinary   Musculoskeletal negative musculoskeletal ROS (+)   Abdominal   Peds negative pediatric ROS (+)  Hematology negative hematology ROS (+)   Anesthesia Other Findings   Reproductive/Obstetrics negative OB ROS                           Anesthesia Physical Anesthesia Plan  ASA: III  Anesthesia Plan: General   Post-op Pain Management:    Induction: Intravenous  Airway Management Planned: Oral ETT  Additional Equipment:   Intra-op Plan:   Post-operative Plan: Extubation in OR  Informed Consent: I have reviewed the patients History and Physical, chart, labs and discussed the procedure including the risks, benefits and alternatives for the proposed anesthesia with the patient or authorized representative who has indicated his/her understanding and acceptance.   Dental advisory given  Plan Discussed with: CRNA and Surgeon  Anesthesia Plan Comments:         Anesthesia Quick Evaluation

## 2012-10-05 NOTE — Anesthesia Postprocedure Evaluation (Signed)
  Anesthesia Post-op Note  Patient: [name]  Procedure(s) Performed: Procedure(s) (LRB): NEPHROLITHOTOMY PERCUTANEOUS (Right) CYSTOSCOPY, retrograde and right stent placement (N/A)  Patient Location: PACU  Anesthesia Type: General  Level of Consciousness: awake and alert   Airway and Oxygen Therapy: Patient Spontanous Breathing  Post-op Pain: mild  Post-op Assessment: Post-op Vital signs reviewed, Patient's Cardiovascular Status Stable, Respiratory Function Stable, Patent Airway and No signs of Nausea or vomiting  Last Vitals:  Filed Vitals:   10/05/12 0828  BP: 141/83  Pulse: 87  Temp: 36.5 C  Resp: 18    Post-op Vital Signs: stable   Complications: No apparent anesthesia complications

## 2012-10-05 NOTE — Transfer of Care (Signed)
Immediate Anesthesia Transfer of Care Note  Patient: [name]  Procedure(s) Performed: Procedure(s) with comments: NEPHROLITHOTOMY PERCUTANEOUS (Right) - WITH CYSTO AND SURGEON ACCESS  CYSTOSCOPY, retrograde and right stent placement (N/A)  Patient Location: PACU  Anesthesia Type:General  Level of Consciousness: awake, alert , oriented and patient cooperative  Airway & Oxygen Therapy: Patient Spontanous Breathing and Patient connected to face mask oxygen  Post-op Assessment: Report given to PACU RN, Post -op Vital signs reviewed and stable and Patient moving all extremities  Post vital signs: Reviewed and stable  Complications: No apparent anesthesia complications

## 2012-10-06 ENCOUNTER — Encounter (HOSPITAL_COMMUNITY): Payer: Self-pay | Admitting: Urology

## 2012-10-06 LAB — BASIC METABOLIC PANEL
BUN: 15 mg/dL (ref 6–23)
Chloride: 104 mEq/L (ref 96–112)
GFR calc Af Amer: 74 mL/min — ABNORMAL LOW (ref >90)
GFR calc non Af Amer: 64 mL/min — ABNORMAL LOW (ref >90)
Potassium: 4.3 mEq/L (ref 3.5–5.1)

## 2012-10-06 LAB — HEMOGLOBIN AND HEMATOCRIT, BLOOD: Hemoglobin: 11.6 g/dL — ABNORMAL LOW (ref 13.0–17.0)

## 2012-10-06 MED ORDER — SODIUM CHLORIDE 0.9 % IV SOLN
Freq: Once | INTRAVENOUS | Status: AC
  Administered 2012-10-06: 04:00:00 via INTRAVENOUS

## 2012-10-06 NOTE — Op Note (Signed)
NAMEDONACIANO, RANGE NO.:  [id number]  MEDICAL RECORD NO.:  [id number]  LOCATION:  1433                         FACILITY:  [facility]  PHYSICIAN:  [name], MD     DATE OF BIRTH:  [birth year]  DATE OF PROCEDURE:  10/05/2012 DATE OF DISCHARGE:                              OPERATIVE REPORT   DIAGNOSES:  Right partial staghorn kidney stone, history of urinary tract infections.  PROCEDURES: 1. Cystoscopy with right retrograde pyelogram and interpretation,     insertion of right ureteral stent. 2. Right percutaneous access to the kidney. 3. Right percutaneous nephrostolithotomy, first stage, greater than 2     cm with dilation of the tract. 4. Right antegrade nephrostogram interpretation. 5. Placement of right nephrostomy tube.  COMPLICATIONS:  None.  SPECIMEN:  Right renal stone, some set aside for compositional analysis.  DRAINS: 1. Foley catheter for straight drain. 2. Right nephroureteral stent capped. 3. Right nephrostomy tube to straight drain.  ESTIMATED BLOOD LOSS:  150 mL.  INDICATION:  [name] is a pleasant 52 year old gentleman with a recent history of recurrent urinary tract infections.  He was found on workup for this to have a large volume right intrarenal stone consistent with partial staghorn.  There was minimal hydronephrosis; however, this was felt to be likely nidus for his infections.  Nuclear medicine radiogram was obtained, which revealed a greater than 30% right relative function. Options were discussed including observation versus nephrectomy versus removal of the stone with retrograde and antegrade approaches and was received with staged percutaneous nephrostolithotomy.  He presents for his first stage today.  Informed consent was obtained and placed in medical record.  PROCEDURE IN DETAIL:  The patient being [name], was being identified, procedure being right percutaneous nephrostolithotomy was confirmed.  Procedure  was carried out.  Time-out was performed. Intravenous antibiotics administered.  General endotracheal anesthesia was introduced.  The patient placed into a low lithotomy position. Sterile field was created by prepping and draping the patient's penis, perineum, and proximal thighs using iodine x3.  Next, cystourethroscopy was performed using a 22-French rigid cystoscope with a 12-degree offset lens.  The patient's anterior and posterior urethra were unremarkable. Inspection of the bladder revealed no diverticula, calcifications, papillary lesions.  The right ureteral orifice was gently cannulated using a 6-French end-hole catheter, and right retrograde pyelogram was obtained.  Right retrograde pyelogram reveals a single right ureter, single system right kidney, and there was a single filling defect in mid pole consistent with known stone.  There was a very small, almost negligible amount of renal pelvis and the infundibulum over a narrow and the open- ended catheter was then advanced at the level of the mid pole.  Foley catheter was placed for a straight drain, and the end-hole catheter was fashioned to this using silk ties and capped.  The patient was then lifted to prone position and applying prone view chest rolls, shoulder supports, padding of the knees, sequential pressure devices, and the right flank was then prepped and draped using chlorhexidine gluconate as was the previous nephroureteral stent.  The table was placed in approximately 10 degrees of flexion.  Next, by performing a simultaneous retrograde  pyelogram, a single calyx in the lower pole appeared to be posterior in orientation and somewhat relatively dilated, was chosen for access.  Using bulls eye technique and 25 degrees off center, this calyx was cannulated using an 18-gauge Chiba needle.  A 0.038 Glidewire was then navigated out of this down the ureter and exchanged for a superstiff wire over a KMP catheter.  A  dual-lumen access device was then used and placed at the level of the proximal ureter.  Over this, a second Glidewire was advanced and exchanged for a second superstiff wire.  Next, the percutaneous drape was applied.  The NephroMax balloon dilation apparatus was then carefully advanced across the lower pole calyx, inflated to a pressure of 16 atmospheres, held for 90 seconds. The sheath was advanced.  The balloon was removed.  Rigid nephroscopy was performed.  This revealed excellent placement of the sheath in the lower pole calyx.  There were some small stone fragments in this location.  These were grasped with a cold grasper, dissected the renal infundibulum of very narrow caliber.  The rigid nephroscope was only able to clear out this calyx, as such a 16-French flexible cystoscope was used to perform flexible nephroscopy.  This revealed several additional areas of calcifications including renal pelvis in lower pole. Holmium laser energy was applied to these areas, and a ZeroTip basket was used to remove the fragments.  All visible stone material was removed.  The new KMP catheter was advanced down the level of the ureter alongside the previous Superstiff working wire with a nephroureteral stent.  A new Glidewire was advanced to the level of the upper pole over which a new 16-French nephrostomy tube was placed and coiled in the upper pole.  This was allowed to be straight drain.  Safety wire was removed.  Drains were sutured in place.  Procedure was then terminated. The patient tolerated the procedure well.  There were no immediate preprocedural complications.  The patient was taken to the postanesthesia care unit in stable condition.          ______________________________ [name], MD     TM/MEDQ  D:  10/05/2012  T:  10/06/2012  Job:  [id number]

## 2012-10-06 NOTE — H&P (Signed)
Pt hypotensive, manual BP reading, 76/50, pulse 95% and SpO2 95% RA. Pt received Dilaudid 1 mg at 0115. Pt is easily aroused, mentation is at baseline, oriented x 4, and denies dizziness. Provider called to implement orders. Will continue to monitor.

## 2012-10-06 NOTE — Progress Notes (Signed)
   CARE MANAGEMENT NOTE 10/06/2012  Patient:  [name], [name]   Account Number:  [id number]  Date Initiated:  10/06/2012  Documentation initiated by:  Jiles Crocker  Subjective/Objective Assessment:   ADMITTED FOR SURGERY - percutaneousnephrostolithotomy with access 10/05/12     Action/Plan:   PCP IS DR AVBUERE  LIVES ALONE, HAS A NURSES AIDE FROM [facility]   Anticipated DC Date:  10/11/2012   Anticipated DC Plan:  HOME/SELF CARE      Status of service:  In process, will continue to follow Medicare Important Message given?  NA - LOS <3 / Initial given by admissions (If response is "NO", the following Medicare IM given date fields will be blank) Per UR Regulation:  Reviewed for med. necessity/level of care/duration of stay Comments:  10/06/2012- B Fatumata Kashani RN,BSN,MHA

## 2012-10-06 NOTE — Progress Notes (Signed)
1 Day Post-Op  Subjective:  1 - Rt Partial Staghorn Kidney Stone - s/p Rt 1st stage percutaneousnephrostolithotomy with access 10/05/12.  Today [name] is without complaints. Had one episode of pain and hypertension last night that resolved with prn pain meds. Neph tube and foley draining well. No n/v. No fevers.  Objective: Vital signs in last 24 hours: Temp:  [97 F (36.1 C)-98.7 F (37.1 C)] 97.9 F (36.6 C) (04/08 0300) Pulse Rate:  [78-109] 85 (04/08 0442) Resp:  [12-22] 16 (04/08 0300) BP: (76-164)/(50-96) 118/63 mmHg (04/08 0442) SpO2:  [96 %-100 %] 98 % (04/08 0442) Weight:  [95.2 kg (209 lb 14.1 oz)] 95.2 kg (209 lb 14.1 oz) (04/07 1605) Last BM Date: 10/05/12  Intake/Output from previous day: 04/07 0701 - 04/08 0700 In: 2740 [P.O.:240; I.V.:2400; IV Piggyback:100] Out: 910 [Urine:810; Blood:100] Intake/Output this shift:    General appearance: alert, cooperative and appears stated age Head: Normocephalic, without obvious abnormality, atraumatic Eyes: Lt eye with chronic changes Ears: normal TM's and external ear canals both ears Nose: Nares normal. Septum midline. Mucosa normal. No drainage or sinus tenderness. Throat: lips, mucosa, and tongue normal; teeth and gums normal Neck: no adenopathy, no carotid bruit, no JVD, supple, symmetrical, trachea midline and thyroid not enlarged, symmetric, no tenderness/mass/nodules Back: symmetric, no curvature. ROM normal. No CVA tenderness. Resp: clear to auscultation bilaterally Chest wall: no tenderness Cardio: regular rate and rhythm, S1, S2 normal, no murmur, click, rub or gallop GI: soft, non-tender; bowel sounds normal; no masses,  no organomegaly Male genitalia: normal, Foley c/d/i with some urine / small clots in foley bag. Draining well.  Extremities: extremities normal, atraumatic, no cyanosis or edema Pulses: 2+ and symmetric Skin: Skin color, texture, turgor normal. No rashes or lesions Lymph nodes: Cervical,  supraclavicular, and axillary nodes normal. Neurologic: Grossly normal Incision/Wound: Rt neph tube c/d/i with light pink urine.   Lab Results:   Recent Labs  10/05/12 1447 10/06/12 0455  HGB 15.1 11.6*  HCT 44.6 34.6*   BMET  Recent Labs  10/05/12 1447 10/06/12 0455  NA 136 135  K 3.9 4.3  CL 102 104  CO2 24 24  GLUCOSE 111* 130*  BUN 13 15  CREATININE 0.97 1.25  CALCIUM 8.6 8.2*   PT/INR No results found for this basename: LABPROT, INR,  in the last 72 hours ABG No results found for this basename: PHART, PCO2, PO2, HCO3,  in the last 72 hours  Studies/Results: Dg Retrograde Pyelogram  10/05/2012  *RADIOLOGY REPORT*  Clinical Data: Right renal calculi.  RETROGRADE PYELOGRAM  Comparison: CT on 07/30/2012  Findings: Imaging obtained intraoperatively with a C-arm shows retrograde cannulation of the right ureter.  Retrograde contrast injection shows a normal appearance to the ureter.  Filling defects are present in the central right kidney at the level of an infundibulum and extending into several lower pole calyces. Percutaneous access was performed with evidence of advancement of a sheath for percutaneous nephrolithotomy.  IMPRESSION: Retrograde pyelogram shows a right staghorn type calculus occupying lower pole calyces and a lower pole infundibulum.  Percutaneous nephrolithotomy was performed.   Original Report Authenticated By: Irish Lack, M.D.    Dg C-arm 1-60 Min-no Report  10/05/2012  CLINICAL DATA: Percutaneous Nephrolithotomy   C-ARM 1-60 MINUTES  Fluoroscopy was utilized by the requesting physician.  No radiographic  interpretation.      Anti-infectives: Anti-infectives   Start     Dose/Rate Route Frequency Ordered Stop   10/06/12 1000  cefTRIAXone (ROCEPHIN)  1 g in dextrose 5 % 50 mL IVPB     1 g 100 mL/hr over 30 Minutes Intravenous Every 24 hours 10/05/12 1616     10/05/12 0845  cefTRIAXone (ROCEPHIN) 1 g in dextrose 5 % 50 mL IVPB     1 g 100 mL/hr over  30 Minutes Intravenous  Once 10/05/12 0830 10/05/12 1200   10/05/12 0830  ciprofloxacin (CIPRO) IVPB 400 mg     400 mg 200 mL/hr over 60 Minutes Intravenous 60 min pre-op 10/05/12 0830 10/05/12 1128      Assessment/Plan:  1 - Rt Partial Staghorn Kidney Stone - s/p 1st stage surgery yesterday. Hgb and Cr acceptable. Will plan for second stage PCNL tomorrow. Continue current ABX and tubes. NPO p MN tonight.    LOS: 1 day    [facility], Lataunya Ruud 10/06/2012

## 2012-10-07 ENCOUNTER — Other Ambulatory Visit: Payer: Self-pay | Admitting: Urology

## 2012-10-07 ENCOUNTER — Ambulatory Visit (HOSPITAL_COMMUNITY): Admission: RE | Admit: 2012-10-07 | Payer: Medicaid Other | Source: Ambulatory Visit | Admitting: Urology

## 2012-10-07 ENCOUNTER — Inpatient Hospital Stay (HOSPITAL_COMMUNITY): Payer: Medicaid Other

## 2012-10-07 ENCOUNTER — Encounter (HOSPITAL_COMMUNITY): Payer: Self-pay | Admitting: Anesthesiology

## 2012-10-07 ENCOUNTER — Encounter (HOSPITAL_COMMUNITY)
Admission: RE | Disposition: A | Discharge status: Home / Self Care | Payer: Self-pay | Source: Ambulatory Visit | Attending: Urology

## 2012-10-07 ENCOUNTER — Inpatient Hospital Stay (HOSPITAL_COMMUNITY): Payer: Medicaid Other | Admitting: Anesthesiology

## 2012-10-07 HISTORY — PX: NEPHROLITHOTOMY: SHX5134

## 2012-10-07 LAB — HEMOGLOBIN AND HEMATOCRIT, BLOOD
HCT: 34.6 % — ABNORMAL LOW (ref 39.0–52.0)
Hemoglobin: 9.7 g/dL — ABNORMAL LOW (ref 13.0–17.0)
Hemoglobin: 11.2 g/dL — ABNORMAL LOW (ref 13.0–17.0)

## 2012-10-07 SURGERY — NEPHROLITHOTOMY PERCUTANEOUS SECOND LOOK
Anesthesia: General | Laterality: Right | Wound class: Clean Contaminated

## 2012-10-07 MED ORDER — LACTATED RINGERS IV SOLN
INTRAVENOUS | Status: DC | PRN
  Administered 2012-10-07: 12:00:00 via INTRAVENOUS

## 2012-10-07 MED ORDER — LACTATED RINGERS IV SOLN
INTRAVENOUS | Status: DC
  Administered 2012-10-07: 1000 mL via INTRAVENOUS

## 2012-10-07 MED ORDER — LACTATED RINGERS IV SOLN: INTRAVENOUS | Status: DC

## 2012-10-07 MED ORDER — PROPOFOL 10 MG/ML IV BOLUS
INTRAVENOUS | Status: DC | PRN
  Administered 2012-10-07: 150 mg via INTRAVENOUS

## 2012-10-07 MED ORDER — DEXAMETHASONE SODIUM PHOSPHATE 10 MG/ML IJ SOLN
INTRAMUSCULAR | Status: DC | PRN
  Administered 2012-10-07: 10 mg via INTRAVENOUS

## 2012-10-07 MED ORDER — TISSEEL VH 10 ML EX KIT
PACK | CUTANEOUS | Status: DC | PRN
  Administered 2012-10-07: 1

## 2012-10-07 MED ORDER — IOHEXOL 300 MG/ML  SOLN
INTRAMUSCULAR | Status: DC | PRN
  Administered 2012-10-07: 40 mL

## 2012-10-07 MED ORDER — ONDANSETRON HCL 4 MG/2ML IJ SOLN
INTRAMUSCULAR | Status: DC | PRN
  Administered 2012-10-07: 4 mg via INTRAVENOUS

## 2012-10-07 MED ORDER — PROMETHAZINE HCL 25 MG/ML IJ SOLN: 6.2500 mg | INTRAMUSCULAR | Status: DC | PRN

## 2012-10-07 MED ORDER — SODIUM CHLORIDE 0.9 % IR SOLN
Status: DC | PRN
  Administered 2012-10-07: 6000 mL

## 2012-10-07 MED ORDER — HYDROMORPHONE HCL PF 1 MG/ML IJ SOLN: 0.2500 mg | INTRAMUSCULAR | Status: DC | PRN

## 2012-10-07 MED ORDER — FENTANYL CITRATE 0.05 MG/ML IJ SOLN
INTRAMUSCULAR | Status: DC | PRN
  Administered 2012-10-07: 100 ug via INTRAVENOUS

## 2012-10-07 MED ORDER — SODIUM CHLORIDE 0.9 % IR SOLN
Status: DC | PRN
  Administered 2012-10-07: 1000 mL

## 2012-10-07 MED ORDER — SUCCINYLCHOLINE CHLORIDE 20 MG/ML IJ SOLN
INTRAMUSCULAR | Status: DC | PRN
  Administered 2012-10-07: 100 mg via INTRAVENOUS

## 2012-10-07 SURGICAL SUPPLY — 47 items
APL SKNCLS STERI-STRIP NONHPOA (GAUZE/BANDAGES/DRESSINGS) ×1
BAG URINE DRAINAGE (UROLOGICAL SUPPLIES) ×2 IMPLANT
BASKET ZERO TIP NITINOL 2.4FR (BASKET) ×2 IMPLANT
BENZOIN TINCTURE PRP APPL 2/3 (GAUZE/BANDAGES/DRESSINGS) ×5 IMPLANT
BLADE SURG 15 STRL LF DISP TIS (BLADE) ×1 IMPLANT
BLADE SURG 15 STRL SS (BLADE) ×2
BSKT STON RTRVL ZERO TP 2.4FR (BASKET) ×1
CATCHER STONE W/TUBE ADAPTER (UROLOGICAL SUPPLIES) ×1 IMPLANT
CATH FOLEY 2W COUNCIL 20FR 5CC (CATHETERS) IMPLANT
CATH ROBINSON RED A/P 20FR (CATHETERS) IMPLANT
CATH X-FORCE N30 NEPHROSTOMY (TUBING) ×1 IMPLANT
CLOTH BEACON ORANGE TIMEOUT ST (SAFETY) ×2 IMPLANT
COVER SURGICAL LIGHT HANDLE (MISCELLANEOUS) ×1 IMPLANT
DRAPE C-ARM 42X72 X-RAY (DRAPES) ×2 IMPLANT
DRAPE CAMERA CLOSED 9X96 (DRAPES) ×2 IMPLANT
DRAPE LINGEMAN PERC (DRAPES) ×2 IMPLANT
DRAPE SURG IRRIG POUCH 19X23 (DRAPES) ×2 IMPLANT
DRSG TEGADERM 4X4.75 (GAUZE/BANDAGES/DRESSINGS) ×2 IMPLANT
DRSG TEGADERM 8X12 (GAUZE/BANDAGES/DRESSINGS) ×3 IMPLANT
GLOVE SURG SS PI 8.0 STRL IVOR (GLOVE) ×1 IMPLANT
GOWN STRL REIN XL XLG (GOWN DISPOSABLE) ×2 IMPLANT
GUIDEWIRE ANG ZIPWIRE 038X150 (WIRE) ×1 IMPLANT
GUIDEWIRE STR DUAL SENSOR (WIRE) ×1 IMPLANT
KIT BASIN OR (CUSTOM PROCEDURE TRAY) ×2 IMPLANT
LASER FIBER DISP (UROLOGICAL SUPPLIES) IMPLANT
LASER FIBER DISP 1000U (UROLOGICAL SUPPLIES) IMPLANT
MANIFOLD NEPTUNE II (INSTRUMENTS) ×2 IMPLANT
NS IRRIG 1000ML POUR BTL (IV SOLUTION) ×1 IMPLANT
PACK BASIC VI WITH GOWN DISP (CUSTOM PROCEDURE TRAY) ×2 IMPLANT
PAD ABD 7.5X8 STRL (GAUZE/BANDAGES/DRESSINGS) ×3 IMPLANT
PROBE LITHOCLAST ULTRA 3.8X403 (UROLOGICAL SUPPLIES) ×1 IMPLANT
PROBE PNEUMATIC 1.0MMX570MM (UROLOGICAL SUPPLIES) ×1 IMPLANT
SET IRRIG Y TYPE TUR BLADDER L (SET/KITS/TRAYS/PACK) ×2 IMPLANT
SET WARMING FLUID IRRIGATION (MISCELLANEOUS) ×1 IMPLANT
SPONGE GAUZE 4X4 12PLY (GAUZE/BANDAGES/DRESSINGS) ×1 IMPLANT
SPONGE LAP 4X18 X RAY DECT (DISPOSABLE) ×2 IMPLANT
STENT CONTOUR 6FRX26X.038 (STENTS) ×1 IMPLANT
STONE CATCHER W/TUBE ADAPTER (UROLOGICAL SUPPLIES) IMPLANT
SUT SILK 2 0 30  PSL (SUTURE) ×1
SUT SILK 2 0 30 PSL (SUTURE) ×1 IMPLANT
SUT VIC AB 3-0 SH 27 (SUTURE) ×2
SUT VIC AB 3-0 SH 27XBRD (SUTURE) IMPLANT
SYR 20CC LL (SYRINGE) ×3 IMPLANT
SYRINGE 10CC LL (SYRINGE) ×2 IMPLANT
TOWEL OR NON WOVEN STRL DISP B (DISPOSABLE) ×2 IMPLANT
TRAY FOLEY CATH 14FRSI W/METER (CATHETERS) ×1 IMPLANT
TUBING CONNECTING 10 (TUBING) ×4 IMPLANT

## 2012-10-07 NOTE — Preoperative (Signed)
Beta Blockers   Reason not to administer Beta Blockers:Not Applicable 

## 2012-10-07 NOTE — Progress Notes (Signed)
2 Days Post-Op  Subjective:   1 - Rt Partial Staghorn Kidney Stone - s/p Rt 1st stage percutaneousnephrostolithotomy with access 10/05/12.   Today [name] is without complaints. His tubes are draining well and he was able to walk in hall several times yesterday.     Objective: Vital signs in last 24 hours: Temp:  [97.9 F (36.6 C)-98.9 F (37.2 C)] 97.9 F (36.6 C) (04/08 2315) Pulse Rate:  [84-89] 84 (04/08 2315) Resp:  [20] 20 (04/08 2315) BP: (107-114)/(56-65) 114/65 mmHg (04/08 2315) SpO2:  [97 %-100 %] 98 % (04/08 2315) Last BM Date: 10/05/12  Intake/Output from previous day: 04/08 0701 - 04/09 0700 In: 1786.7 [P.O.:720; I.V.:1016.7; IV Piggyback:50] Out: 3325 [Urine:3325] Intake/Output this shift:    General appearance: alert, cooperative and appears stated age Head: Normocephalic, without obvious abnormality, atraumatic Eyes: conjunctivae/corneas clear. PERRL, EOM's intact. Fundi benign. Ears: normal TM's and external ear canals both ears Nose: Nares normal. Septum midline. Mucosa normal. No drainage or sinus tenderness. Throat: lips, mucosa, and tongue normal; teeth and gums normal Neck: no adenopathy, no carotid bruit, no JVD, supple, symmetrical, trachea midline and thyroid not enlarged, symmetric, no tenderness/mass/nodules Back: symmetric, no curvature. ROM normal. No CVA tenderness. Resp: clear to auscultation bilaterally Chest wall: no tenderness Cardio: regular rate and rhythm, S1, S2 normal, no murmur, click, rub or gallop GI: soft, non-tender; bowel sounds normal; no masses,  no organomegaly Male genitalia: normal Extremities: extremities normal, atraumatic, no cyanosis or edema Pulses: 2+ and symmetric Skin: Skin color, texture, turgor normal. No rashes or lesions Lymph nodes: Cervical, supraclavicular, and axillary nodes normal. Neurologic: Grossly normal Incision/Wound: Rt neph tube c/d/i with light pink urine. Foley also with light pink urine.   Lab  Results:   Recent Labs  10/06/12 0455 10/07/12 0530  HGB 11.6* 9.7*  HCT 34.6* 29.6*   BMET  Recent Labs  10/05/12 1447 10/06/12 0455  NA 136 135  K 3.9 4.3  CL 102 104  CO2 24 24  GLUCOSE 111* 130*  BUN 13 15  CREATININE 0.97 1.25  CALCIUM 8.6 8.2*   PT/INR No results found for this basename: LABPROT, INR,  in the last 72 hours ABG No results found for this basename: PHART, PCO2, PO2, HCO3,  in the last 72 hours  Studies/Results: Dg Retrograde Pyelogram  10/05/2012  *RADIOLOGY REPORT*  Clinical Data: Right renal calculi.  RETROGRADE PYELOGRAM  Comparison: CT on 07/30/2012  Findings: Imaging obtained intraoperatively with a C-arm shows retrograde cannulation of the right ureter.  Retrograde contrast injection shows a normal appearance to the ureter.  Filling defects are present in the central right kidney at the level of an infundibulum and extending into several lower pole calyces. Percutaneous access was performed with evidence of advancement of a sheath for percutaneous nephrolithotomy.  IMPRESSION: Retrograde pyelogram shows a right staghorn type calculus occupying lower pole calyces and a lower pole infundibulum.  Percutaneous nephrolithotomy was performed.   Original Report Authenticated By: Irish Lack, M.D.    Dg C-arm 1-60 Min-no Report  10/05/2012  CLINICAL DATA: Percutaneous Nephrolithotomy   C-ARM 1-60 MINUTES  Fluoroscopy was utilized by the requesting physician.  No radiographic  interpretation.      Anti-infectives: Anti-infectives   Start     Dose/Rate Route Frequency Ordered Stop   10/06/12 1000  cefTRIAXone (ROCEPHIN) 1 g in dextrose 5 % 50 mL IVPB     1 g 100 mL/hr over 30 Minutes Intravenous Every 24 hours 10/05/12 1616  10/05/12 0845  cefTRIAXone (ROCEPHIN) 1 g in dextrose 5 % 50 mL IVPB     1 g 100 mL/hr over 30 Minutes Intravenous  Once 10/05/12 0830 10/05/12 1200   10/05/12 0830  ciprofloxacin (CIPRO) IVPB 400 mg     400 mg 200 mL/hr over 60  Minutes Intravenous 60 min pre-op 10/05/12 0830 10/05/12 1128      Assessment/Plan:  1 - Rt Partial Staghorn Kidney Stone - Proceed with 2nd stage PCNL today as planned. Hgb noted and likely dilutional as he is receiving IV hydration and was admittedly dehydrated on admission. Urine without sig gross blood. Procedure today will NOT involve in new access / puncture to avoid additional excess blood loss. He is a new Jehovah's witness (new since his last office visit with me). He agrees to albumin, EPO if needed.  [facility], Jahon Bart 10/07/2012

## 2012-10-07 NOTE — Transfer of Care (Signed)
Immediate Anesthesia Transfer of Care Note  Patient: [name]  Procedure(s) Performed: Procedure(s): NEPHROLITHOTOMY PERCUTANEOUS SECOND LOOK. Stent exchange,Ureteroscopy  (Right)  Patient Location: PACU  Anesthesia Type:General  Level of Consciousness: awake, alert , oriented and patient cooperative  Airway & Oxygen Therapy: Patient Spontanous Breathing and Patient connected to face mask oxygen  Post-op Assessment: Report given to PACU RN and Post -op Vital signs reviewed and stable  Post vital signs: Reviewed and stable  Complications: No apparent anesthesia complications

## 2012-10-07 NOTE — Anesthesia Preprocedure Evaluation (Signed)
Anesthesia Evaluation  Patient identified by MRN, date of birth, ID band Patient awake    Reviewed: Allergy & Precautions, H&P , NPO status , Patient's Chart, lab work & pertinent test results  Airway Mallampati: III TM Distance: <3 FB Neck ROM: Full    Dental no notable dental hx. (+) Teeth Intact and Poor Dentition   Pulmonary COPD COPD inhaler, Current Smoker,  breath sounds clear to auscultation  Pulmonary exam normal       Cardiovascular hypertension, Pt. on medications Rhythm:Regular Rate:Normal     Neuro/Psych CVA, Residual Symptoms negative psych ROS   GI/Hepatic negative GI ROS, Neg liver ROS,   Endo/Other  Morbid obesity  Renal/GU negative Renal ROS  negative genitourinary   Musculoskeletal negative musculoskeletal ROS (+)   Abdominal   Peds  Hematology negative hematology ROS (+)   Anesthesia Other Findings   Reproductive/Obstetrics                           Anesthesia Physical Anesthesia Plan  ASA: III  Anesthesia Plan: General   Post-op Pain Management:    Induction: Intravenous  Airway Management Planned: Oral ETT  Additional Equipment:   Intra-op Plan:   Post-operative Plan: Extubation in OR  Informed Consent: I have reviewed the patients History and Physical, chart, labs and discussed the procedure including the risks, benefits and alternatives for the proposed anesthesia with the patient or authorized representative who has indicated his/her understanding and acceptance.   Dental advisory given  Plan Discussed with: CRNA  Anesthesia Plan Comments:         Anesthesia Quick Evaluation

## 2012-10-07 NOTE — Anesthesia Postprocedure Evaluation (Signed)
Anesthesia Post Note  Patient: [name]  Procedure(s) Performed: Procedure(s) (LRB): NEPHROLITHOTOMY PERCUTANEOUS SECOND LOOK. Stent exchange,Ureteroscopy  (Right)  Anesthesia type: General  Patient location: PACU  Post pain: Pain level controlled  Post assessment: Post-op Vital signs reviewed  Last Vitals:  Filed Vitals:   10/07/12 1415  BP:   Pulse: 108  Temp:   Resp: 24    Post vital signs: Reviewed  Level of consciousness: sedated  Complications: No apparent anesthesia complications

## 2012-10-07 NOTE — Brief Op Note (Signed)
10/05/2012 - 10/07/2012  1:42 PM  PATIENT:  Reynolds Bowl  52 y.o. male  PRE-OPERATIVE DIAGNOSIS:  RIGHT STAGHORN KIDNEY STONE  POST-OPERATIVE DIAGNOSIS:  RIGHT STAGHORN KIDNEY STONE  PROCEDURE:  Procedure(s): NEPHROLITHOTOMY PERCUTANEOUS SECOND LOOK. Stent exchange,Ureteroscopy  (Right)  SURGEON:  Surgeon(s) and Role:    * [name], MD - Primary  PHYSICIAN ASSISTANT:   ASSISTANTS: none   ANESTHESIA:   general  EBL:  Total I/O In: 900 [I.V.:900] Out: -   BLOOD ADMINISTERED:none  DRAINS: 1 - Foley   LOCAL MEDICATIONS USED:  NONE  SPECIMEN:  Source of Specimen:  Residual Rt Renal Stone - discard  DISPOSITION OF SPECIMEN:  N/A  COUNTS:  YES  TOURNIQUET:  * No tourniquets in log *  DICTATION: .Other Dictation: Dictation Number [id number]  PLAN OF CARE: Admit to inpatient   PATIENT DISPOSITION:  PACU - hemodynamically stable.   Delay start of Pharmacological VTE agent (>24hrs) due to surgical blood loss or risk of bleeding: yes

## 2012-10-08 ENCOUNTER — Encounter (HOSPITAL_COMMUNITY): Payer: Self-pay | Admitting: Urology

## 2012-10-08 MED ORDER — SENNA-DOCUSATE SODIUM 8.6-50 MG PO TABS: 1.0000 | ORAL_TABLET | Freq: Two times a day (BID) | ORAL | Status: DC

## 2012-10-08 MED ORDER — CEPHALEXIN 250 MG PO CAPS: 250.0000 mg | ORAL_CAPSULE | Freq: Two times a day (BID) | ORAL | Status: DC

## 2012-10-08 MED ORDER — OXYCODONE-ACETAMINOPHEN 5-325 MG PO TABS: 1.0000 | ORAL_TABLET | ORAL | Status: DC | PRN

## 2012-10-08 NOTE — Op Note (Signed)
[name], [name]               ACCOUNT NO.:  [id number]  MEDICAL RECORD NO.:  [id number]  LOCATION:  1433                         FACILITY:  [facility]  PHYSICIAN:  [name], MD     DATE OF BIRTH:  02/14/1961  DATE OF PROCEDURE: 10/07/2012 DATE OF DISCHARGE:                              OPERATIVE REPORT   PREOPERATIVE DIAGNOSIS:  Residual right renal stone.  PROCEDURES: 1. Second-stage right percutaneous nephrostolithotomy stone less than     2 cm. 2. Right antegrade nephrostogram with interpretation. 3. Exchange of right ureteral stent. 4. Right diagnostic ureteroscopy.  COMPLICATIONS:  None.  SPECIMEN:  Small volume residual stone for discard.  DRAINS:  Foley catheter straight drain.  ESTIMATED BLOOD LOSS:  Less than 50 mL.  INDICATION:  [name] is a pleasant 52 year old gentleman with history of right partial staghorn stone and was found on workup with recurrent urine tract infections.  He underwent first stage procedure on October 05, 2012, at which point the vast majority of the stone was addressed; however, given the multifocality and complex renal anatomy, a very narrow infundibulum so that a stage procedure was warranted with the second to be performed today.  Informed consent was obtained and placed in medical record.  DESCRIPTION OF PROCEDURE:  The patient being [name], procedure being second stage percutaneous nephrostolithotomy was confirmed. Procedure was carried out.  Time-out was performed.  Intravenous antibiotics were administered.  General endotracheal anesthesia was introduced.  The patient was placed in a prone position.  Sterile field was created by prepping and draping the patient's right flank as well as his nephroureteral stent and nephrostomy tube, all of this in sterile field.  Notably, chest rolls were applied, sequential pressure devices, and padding of the knees and elbows.  Right antegrade nephrostogram was performed.  Right  antegrade nephrostogram revealed an excellent filling of the right kidney with opacification of all calices.  Again, very narrow infundibulum and antegrade renal pelvis were noted.  No large filling defects.  The nephrostomy tube was then carefully removed.  The flexible nephroscopy was performed with a 16-French flexible cystoscope. Inspection of all calices revealed small volume residual stone, mostly in the very small renal pelvis.  Total volume of approximately 6 mm. The small fragments were grasped with a ZeroTip basket and brought out their entirety.  There was a single lower pole calyx with a very, very small infundibulum visible on nephrostogram; however, this was unable to be cannulated in antegrade fashion.  There were no large filling defects in this area, but we could not rule out residual stone fragments in this location.  Attention was directed to ureteroscopy to rule out any ureteral stone fragments.  A 0.038 Glidewire was advanced at the level of the ureter and the flexible nephroscope was exchanged for the 6- French flexible ureteroscope over the working wire.  It was placed under continuous fluoroscopy to the level of the urinary bladder.  Inspection of the entire ureter revealed no additional calcifications and no mucosal abnormalities.  Using the smaller caliber flexible ureteroscope, again attempts were made to cannulate this lower pole calyx which was unable to be previously visualized and it was also not  visualized with this modality as well.  Once all accessible stone had been removed, hemostasis appeared excellent.  The 0.038 Glidewire was advanced once again until urinary bladder and a new 6 x 26 double-J stent was placed using fluoroscopic guidance.  Good proximal and distal curl were noted. Notably, the proximal curl represented a hooking of the renal pelvis and lower pole.  This was visualized with flexible nephroscopy as the infundibulum was so small at the  proximal hooking was achieved but not coiled completely; however, this was felt to be in satisfactory position. Distal coil was excellent.  The nephroureteral stent was removed and the tract was closed using 10 mL of the Tisseel and the skin was closed using interrupted Vicryl x3 followed by dry dressing.  The procedure was then terminated.  The patient tolerated the procedure well.  There were no immediate periprocedural complications.  The patient was taken to postanesthesia care unit in stable condition.  FINDINGS:  Lower pole calyx was very, very small infundibulum visible on antegrade nephrostogram.  This was unable to be directly inspected due to angulation limitations and very narrow infundibulum and cannot rule out residual stone in this location.          ______________________________ [name], MD     TM/MEDQ  D:  10/07/2012  T:  10/08/2012  Job:  [id number]

## 2012-10-08 NOTE — Discharge Summary (Signed)
Physician Discharge Summary  Patient ID: BEAUDEN TREMONT MRN: [id number] DOB/AGE: 54/03/1961 54 y.o.  Admit date: 10/05/2012 Discharge date: 10/08/2012  Admission Diagnoses: Rt partial staghorn kidney stone  Discharge Diagnoses: Rt partial staghorn kidney stone  Discharged Condition: good  Hospital Course:   Pt underwent Rt 1st stage percutaneous nephrostolithotomy on 10/05/2012 at which point approximatley 90% of accessible stone was removed. He then underwent second stage procedure 10/07/12 where residual stoen was removed and his temporary nephrostomy was exchanged for internalized stent. By 4/10, the day of discharge, pt was ambulatory, tollerating regular diet, pain controlled, voiding to completion, Hgb stable and felt to be adequate for discharge.   Consults: None  Significant Diagnostic Studies: labs: Hgb >10.  Treatments: surgery: Rt 1st stage percutaneous nephrostolithotomy on 10/05/2012, second stage procedure 10/07/12   Discharge Exam: Blood pressure 141/71, pulse 94, temperature 98.3 F (36.8 C), temperature source Oral, resp. rate 20, height 5\' 5"  (1.651 m), weight 95.2 kg (209 lb 14.1 oz), SpO2 97.00%. General appearance: alert, cooperative and appears stated age Head: Normocephalic, without obvious abnormality, atraumatic Eyes: conjunctivae/corneas clear. PERRL, EOM's intact. Fundi benign. Ears: normal TM's and external ear canals both ears Nose: Nares normal. Septum midline. Mucosa normal. No drainage or sinus tenderness. Throat: lips, mucosa, and tongue normal; teeth and gums normal Neck: no adenopathy, no carotid bruit, no JVD, supple, symmetrical, trachea midline and thyroid not enlarged, symmetric, no tenderness/mass/nodules Back: symmetric, no curvature. ROM normal. No CVA tenderness. Resp: clear to auscultation bilaterally Chest wall: no tenderness Cardio: regular rate and rhythm, S1, S2 normal, no murmur, click, rub or gallop GI: soft, non-tender; bowel sounds normal;  no masses,  no organomegaly Male genitalia: normal Extremities: extremities normal, atraumatic, no cyanosis or edema Pulses: 2+ and symmetric Skin: Skin color, texture, turgor normal. No rashes or lesions Lymph nodes: Cervical, supraclavicular, and axillary nodes normal. Neurologic: Grossly normal Incision/Wound: Rt PCNL site c/d/i with dry dressing. No eccymoses. No drainage.   Disposition:      Medication List    STOP taking these medications       traMADol 50 MG tablet  Commonly known as:  ULTRAM      TAKE these medications       allopurinol 300 MG tablet  Commonly known as:  ZYLOPRIM  Take 300 mg by mouth every morning.     baclofen 10 MG tablet  Commonly known as:  LIORESAL  Take 10 mg by mouth 3 (three) times daily.     cephALEXin 250 MG capsule  Commonly known as:  KEFLEX  Take 1 capsule (250 mg total) by mouth 2 (two) times daily. X 3 days. Begin day prior to next urology appointment.     cetirizine 10 MG tablet  Commonly known as:  ZYRTEC  Take 10 mg by mouth every morning.     cetirizine 1 MG/ML syrup  Commonly known as:  ZYRTEC  Take by mouth every morning.     colchicine 0.6 MG tablet  Take 0.6 mg by mouth daily.     diclofenac 50 MG EC tablet  Commonly known as:  VOLTAREN  Take 50 mg by mouth 2 (two) times daily.     fluticasone 50 MCG/ACT nasal spray  Commonly known as:  FLONASE  Place 2 sprays into the nose daily.     omeprazole 20 MG capsule  Commonly known as:  PRILOSEC  Take 20 mg by mouth 2 (two) times daily.     oxyCODONE-acetaminophen 5-325 MG per tablet  Commonly known as:  ROXICET  Take 1 tablet by mouth every 4 (four) hours as needed for pain.     PROAIR HFA IN  Inhale into the lungs as needed.     sennosides-docusate sodium 8.6-50 MG tablet  Commonly known as:  SENOKOT-S  Take 1 tablet by mouth 2 (two) times daily. While taking pain meds to prevent constipation.     simvastatin 20 MG tablet  Commonly known as:  ZOCOR   Take 20 mg by mouth at bedtime.     valsartan-hydrochlorothiazide 320-12.5 MG per tablet  Commonly known as:  DIOVAN-HCT  Take 1 tablet by mouth daily before breakfast.           Follow-up Information   Follow up with [facility], Kaidence Callaway, MD. (We will call you for appt in about 2 weeks.)    Contact information:   [address] [place] [address] [phone]       Signed: [name] 10/08/2012, 7:43 AM

## 2012-10-15 ENCOUNTER — Encounter (HOSPITAL_COMMUNITY): Payer: Self-pay | Admitting: *Deleted

## 2012-10-15 ENCOUNTER — Emergency Department (HOSPITAL_COMMUNITY): Payer: Medicaid Other

## 2012-10-15 ENCOUNTER — Inpatient Hospital Stay (HOSPITAL_COMMUNITY)
Admission: EM | Admit: 2012-10-15 | Discharge: 2012-10-18 | DRG: 921 | Disposition: A | Discharge status: Home / Self Care | Payer: Medicaid Other | Attending: Urology | Admitting: Urology

## 2012-10-15 DIAGNOSIS — I10 Essential (primary) hypertension: Secondary | ICD-10-CM | POA: Diagnosis present

## 2012-10-15 DIAGNOSIS — J4489 Other specified chronic obstructive pulmonary disease: Secondary | ICD-10-CM | POA: Diagnosis present

## 2012-10-15 DIAGNOSIS — Z8673 Personal history of transient ischemic attack (TIA), and cerebral infarction without residual deficits: Secondary | ICD-10-CM

## 2012-10-15 DIAGNOSIS — IMO0002 Reserved for concepts with insufficient information to code with codable children: Principal | ICD-10-CM | POA: Diagnosis present

## 2012-10-15 DIAGNOSIS — H547 Unspecified visual loss: Secondary | ICD-10-CM | POA: Diagnosis present

## 2012-10-15 DIAGNOSIS — R509 Fever, unspecified: Secondary | ICD-10-CM | POA: Diagnosis present

## 2012-10-15 DIAGNOSIS — F17200 Nicotine dependence, unspecified, uncomplicated: Secondary | ICD-10-CM | POA: Diagnosis present

## 2012-10-15 DIAGNOSIS — R58 Hemorrhage, not elsewhere classified: Secondary | ICD-10-CM

## 2012-10-15 DIAGNOSIS — Y838 Other surgical procedures as the cause of abnormal reaction of the patient, or of later complication, without mention of misadventure at the time of the procedure: Secondary | ICD-10-CM | POA: Diagnosis present

## 2012-10-15 DIAGNOSIS — E7800 Pure hypercholesterolemia, unspecified: Secondary | ICD-10-CM | POA: Diagnosis present

## 2012-10-15 DIAGNOSIS — Z79899 Other long term (current) drug therapy: Secondary | ICD-10-CM

## 2012-10-15 DIAGNOSIS — J449 Chronic obstructive pulmonary disease, unspecified: Secondary | ICD-10-CM | POA: Diagnosis present

## 2012-10-15 LAB — BASIC METABOLIC PANEL
BUN: 15 mg/dL (ref 6–23)
Calcium: 8.9 mg/dL (ref 8.4–10.5)
Creatinine, Ser: 0.92 mg/dL (ref 0.50–1.35)
GFR calc non Af Amer: >90 mL/min (ref >90)
Glucose, Bld: 134 mg/dL — ABNORMAL HIGH (ref 70–99)

## 2012-10-15 LAB — CBC WITH DIFFERENTIAL/PLATELET
Eosinophils Absolute: 0.1 10*3/uL (ref 0.0–0.7)
Eosinophils Relative: 1 % (ref 0–5)
Lymphs Abs: 1.2 10*3/uL (ref 0.7–4.0)
MCH: 29.9 pg (ref 26.0–34.0)
MCV: 88.5 fL (ref 78.0–100.0)
Monocytes Absolute: 0.7 10*3/uL (ref 0.1–1.0)
Platelets: 519 10*3/uL — ABNORMAL HIGH (ref 150–400)
RBC: 3.48 MIL/uL — ABNORMAL LOW (ref 4.22–5.81)
RDW: 15.7 % — ABNORMAL HIGH (ref 11.5–15.5)

## 2012-10-15 LAB — URINALYSIS, ROUTINE W REFLEX MICROSCOPIC
Bilirubin Urine: NEGATIVE
Protein, ur: 30 mg/dL — AB
Urobilinogen, UA: 1 mg/dL (ref 0.0–1.0)

## 2012-10-15 LAB — URINE MICROSCOPIC-ADD ON

## 2012-10-15 LAB — PROTIME-INR: Prothrombin Time: 14 seconds (ref 11.6–15.2)

## 2012-10-15 MED ORDER — IOHEXOL 300 MG/ML  SOLN
100.0000 mL | Freq: Once | INTRAMUSCULAR | Status: AC | PRN
  Administered 2012-10-15: 100 mL via INTRAVENOUS

## 2012-10-15 MED ORDER — OXYCODONE-ACETAMINOPHEN 5-325 MG PO TABS
1.0000 | ORAL_TABLET | ORAL | Status: DC | PRN
  Administered 2012-10-15: 1 via ORAL
  Administered 2012-10-16: 2 via ORAL
  Administered 2012-10-16: 1 via ORAL
  Administered 2012-10-17 (×2): 2 via ORAL
  Filled 2012-10-15: qty 2
  Filled 2012-10-15 (×2): qty 1
  Filled 2012-10-15 (×2): qty 2

## 2012-10-15 MED ORDER — SODIUM CHLORIDE 0.9 % IV SOLN
INTRAVENOUS | Status: DC
  Administered 2012-10-15 – 2012-10-17 (×5): via INTRAVENOUS

## 2012-10-15 MED ORDER — FLUTICASONE PROPIONATE 50 MCG/ACT NA SUSP
2.0000 | Freq: Every day | NASAL | Status: DC
  Administered 2012-10-15 – 2012-10-17 (×3): 2 via NASAL
  Filled 2012-10-15: qty 16

## 2012-10-15 MED ORDER — COLCHICINE 0.6 MG PO TABS
0.6000 mg | ORAL_TABLET | Freq: Every day | ORAL | Status: DC
  Administered 2012-10-15 – 2012-10-17 (×3): 0.6 mg via ORAL
  Filled 2012-10-15 (×4): qty 1

## 2012-10-15 MED ORDER — SENNOSIDES-DOCUSATE SODIUM 8.6-50 MG PO TABS
1.0000 | ORAL_TABLET | Freq: Two times a day (BID) | ORAL | Status: DC
  Administered 2012-10-15 – 2012-10-17 (×6): 1 via ORAL
  Filled 2012-10-15 (×8): qty 1

## 2012-10-15 MED ORDER — LORATADINE 10 MG PO TABS
10.0000 mg | ORAL_TABLET | Freq: Every day | ORAL | Status: DC
  Administered 2012-10-15 – 2012-10-17 (×3): 10 mg via ORAL
  Filled 2012-10-15 (×4): qty 1

## 2012-10-15 MED ORDER — PIPERACILLIN-TAZOBACTAM 3.375 G IVPB
3.3750 g | Freq: Three times a day (TID) | INTRAVENOUS | Status: DC
  Administered 2012-10-15 – 2012-10-18 (×9): 3.375 g via INTRAVENOUS
  Filled 2012-10-15 (×10): qty 50

## 2012-10-15 MED ORDER — IRBESARTAN 300 MG PO TABS
300.0000 mg | ORAL_TABLET | Freq: Every day | ORAL | Status: DC
  Administered 2012-10-16 – 2012-10-17 (×2): 300 mg via ORAL
  Filled 2012-10-15 (×3): qty 1

## 2012-10-15 MED ORDER — SIMVASTATIN 20 MG PO TABS
20.0000 mg | ORAL_TABLET | Freq: Every day | ORAL | Status: DC
  Administered 2012-10-15 – 2012-10-17 (×3): 20 mg via ORAL
  Filled 2012-10-15 (×4): qty 1

## 2012-10-15 MED ORDER — SENNA-DOCUSATE SODIUM 8.6-50 MG PO TABS: 1.0000 | ORAL_TABLET | Freq: Two times a day (BID) | ORAL | Status: DC

## 2012-10-15 MED ORDER — PANTOPRAZOLE SODIUM 40 MG PO TBEC
40.0000 mg | DELAYED_RELEASE_TABLET | Freq: Every day | ORAL | Status: DC
  Administered 2012-10-15 – 2012-10-16 (×2): 40 mg via ORAL
  Filled 2012-10-15 (×3): qty 1

## 2012-10-15 MED ORDER — SODIUM CHLORIDE 0.9 % IV SOLN
INTRAVENOUS | Status: DC
  Administered 2012-10-15 – 2012-10-17 (×2): via INTRAVENOUS

## 2012-10-15 MED ORDER — PIPERACILLIN-TAZOBACTAM 3.375 G IVPB
3.3750 g | Freq: Once | INTRAVENOUS | Status: AC
  Administered 2012-10-15: 3.375 g via INTRAVENOUS
  Filled 2012-10-15: qty 50

## 2012-10-15 MED ORDER — SODIUM CHLORIDE 0.9 % IV SOLN: INTRAVENOUS | Status: DC

## 2012-10-15 MED ORDER — BACLOFEN 10 MG PO TABS
10.0000 mg | ORAL_TABLET | Freq: Three times a day (TID) | ORAL | Status: DC
  Administered 2012-10-15 – 2012-10-17 (×8): 10 mg via ORAL
  Filled 2012-10-15 (×11): qty 1

## 2012-10-15 MED ORDER — ALBUTEROL SULFATE HFA 108 (90 BASE) MCG/ACT IN AERS
1.0000 | INHALATION_SPRAY | RESPIRATORY_TRACT | Status: DC | PRN
  Filled 2012-10-15: qty 6.7

## 2012-10-15 MED ORDER — VALSARTAN-HYDROCHLOROTHIAZIDE 320-12.5 MG PO TABS: 1.0000 | ORAL_TABLET | Freq: Every day | ORAL | Status: DC

## 2012-10-15 MED ORDER — HYDROCHLOROTHIAZIDE 12.5 MG PO CAPS
12.5000 mg | ORAL_CAPSULE | Freq: Every day | ORAL | Status: DC
  Administered 2012-10-16 – 2012-10-17 (×2): 12.5 mg via ORAL
  Filled 2012-10-15 (×3): qty 1

## 2012-10-15 MED ORDER — ALLOPURINOL 300 MG PO TABS
300.0000 mg | ORAL_TABLET | Freq: Every morning | ORAL | Status: DC
  Administered 2012-10-15 – 2012-10-17 (×3): 300 mg via ORAL
  Filled 2012-10-15 (×4): qty 1

## 2012-10-15 NOTE — ED Notes (Signed)
Pt reports having urology surgery last week, reports he started bleeding from the incision last night.  Pt reports he's soaked through the dressing and his shirt.  Pt reports he did not have transport last night.  His caregiver brought him in this am.  Pt lives alone.

## 2012-10-15 NOTE — ED Notes (Signed)
Attempted to call report to floor per floor Rn is in an isolation room and unavailable to take report at this time. Will re-attempt in 5-10 minutes

## 2012-10-15 NOTE — Progress Notes (Signed)
PATIENT IS A JEHOVAH'S WITNESS-NO BLOOD PRODUCTS 

## 2012-10-15 NOTE — H&P (Signed)
[name] is an 52 y.o. male.    Chief Complaint: Wound Drainage  HPI:   1 - Rt Retroperitoneal Hematoma and Fever After Percutaneous Nephrostolithotomy - Pt s/p Rt 1st stage PCNL 4/7 plus 2nd stage 4/9 for Rt partial staghorn kidney stone. He had very narrow infundibula and small area of lower pole stone unable to be treated. Was DC'd 4/10 with Hgb stable in 10 range.  He presented to ER today with fever and drainage from his previous Rt nephrostomy site that was bloody prompting IV contrast CT which revealed Rt perinephric hematoma, but no active blush or urinary extravasation. Hgb in ER 10.4 (similar to recent discharge). He takes NSAIDS for Gout. He is a Scientist, product/process development.   Past Medical History  Diagnosis Date  . COPD (chronic obstructive pulmonary disease)   . Hypertension   . Stroke sept 26,  20006     hx mini strokes in past, 1 large stroke  . Arthritis   . High cholesterol   . 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 Left ey surgery  age 52  . Nephrolithotomy Right 10/05/2012    Procedure: NEPHROLITHOTOMY PERCUTANEOUS;  Surgeon: [name], MD;  Location: [facility];  Service: Urology;  Laterality: Right;  WITH CYSTO AND SURGEON ACCESS   . Cystoscopy N/A 10/05/2012    Procedure: CYSTOSCOPY, retrograde and right stent placement;  Surgeon: [name], MD;  Location: [facility];  Service: Urology;  Laterality: N/A;  . Nephrolithotomy Right 10/07/2012    Procedure: NEPHROLITHOTOMY PERCUTANEOUS SECOND LOOK. Stent exchange,Ureteroscopy ;  Surgeon: [name], MD;  Location: [facility];  Service: Urology;  Laterality: Right;    History reviewed. No pertinent family history. Social History:  reports that he has been smoking Cigarettes.  He has a 7.5 pack-year smoking history. He has never used smokeless tobacco. He reports that he does not drink alcohol or use illicit drugs.  Allergies: No  Known Allergies  Medications Prior to Admission  Medication Sig Dispense Refill  . Albuterol Sulfate (PROAIR HFA IN) Inhale into the lungs as needed.      [name] allopurinol (ZYLOPRIM) 300 MG tablet Take 300 mg by mouth every morning.       . baclofen (LIORESAL) 10 MG tablet Take 10 mg by mouth 3 (three) times daily.      . cetirizine (ZYRTEC) 10 MG tablet Take 10 mg by mouth every morning.       . colchicine 0.6 MG tablet Take 0.6 mg by mouth daily.      . diclofenac (VOLTAREN) 50 MG EC tablet Take 50 mg by mouth 2 (two) times daily.      . fluticasone (FLONASE) 50 MCG/ACT nasal spray Place 2 sprays into the nose daily.      [name] omeprazole (PRILOSEC) 20 MG capsule Take 20 mg by mouth 2 (two) times daily.      [name] oxyCODONE-acetaminophen (ROXICET) 5-325 MG per tablet Take 1 tablet by mouth every 4 (four) hours as needed for pain.  30 tablet  0  . sennosides-docusate sodium (SENOKOT-S) 8.6-50 MG tablet Take 1 tablet by mouth 2 (two) times daily. While taking pain meds to prevent constipation.  30 tablet  1  . simvastatin (ZOCOR) 20 MG tablet Take 20 mg by mouth at bedtime.      . valsartan-hydrochlorothiazide (DIOVAN-HCT) 320-12.5 MG per tablet Take 1 tablet  by mouth daily before breakfast.      . cephALEXin (KEFLEX) 250 MG capsule Take 1 capsule (250 mg total) by mouth 2 (two) times daily. X 3 days. Begin day prior to next urology appointment.  6 capsule  0    Results for orders placed during the hospital encounter of 10/15/12 (from the past 48 hour(s))  BASIC METABOLIC PANEL     Status: Abnormal   Collection Time    10/15/12  8:45 AM      Result Value Range   Sodium 135  135 - 145 mEq/L   Potassium 3.7  3.5 - 5.1 mEq/L   Chloride 99  96 - 112 mEq/L   CO2 24  19 - 32 mEq/L   Glucose, Bld 134 (*) 70 - 99 mg/dL   BUN 15  6 - 23 mg/dL   Creatinine, Ser 4.54  0.50 - 1.35 mg/dL   Calcium 8.9  8.4 - 09.8 mg/dL   GFR calc non Af Amer >90  >90 mL/min   GFR calc Af Amer >90  >90 mL/min   Comment:             The eGFR has been calculated     using the CKD EPI equation.     This calculation has not been     validated in all clinical     situations.     eGFR's persistently     <90 mL/min signify     possible Chronic Kidney Disease.  CBC WITH DIFFERENTIAL     Status: Abnormal   Collection Time    10/15/12  8:45 AM      Result Value Range   WBC 9.4  4.0 - 10.5 K/uL   RBC 3.48 (*) 4.22 - 5.81 MIL/uL   Hemoglobin 10.4 (*) 13.0 - 17.0 g/dL   HCT 11.9 (*) 14.7 - 82.9 %   MCV 88.5  78.0 - 100.0 fL   MCH 29.9  26.0 - 34.0 pg   MCHC 33.8  30.0 - 36.0 g/dL   RDW 56.2 (*) 13.0 - 86.5 %   Platelets 519 (*) 150 - 400 K/uL   Neutrophils Relative 79 (*) 43 - 77 %   Neutro Abs 7.4  1.7 - 7.7 K/uL   Lymphocytes Relative 13  12 - 46 %   Lymphs Abs 1.2  0.7 - 4.0 K/uL   Monocytes Relative 7  3 - 12 %   Monocytes Absolute 0.7  0.1 - 1.0 K/uL   Eosinophils Relative 1  0 - 5 %   Eosinophils Absolute 0.1  0.0 - 0.7 K/uL   Basophils Relative 0  0 - 1 %   Basophils Absolute 0.0  0.0 - 0.1 K/uL  PROTIME-INR     Status: None   Collection Time    10/15/12  8:45 AM      Result Value Range   Prothrombin Time 14.0  11.6 - 15.2 seconds   INR 1.09  0.00 - 1.49  APTT     Status: Abnormal   Collection Time    10/15/12  8:45 AM      Result Value Range   aPTT 50 (*) 24 - 37 seconds   Comment:            IF BASELINE aPTT IS ELEVATED,     SUGGEST PATIENT RISK ASSESSMENT     BE USED TO DETERMINE APPROPRIATE     ANTICOAGULANT THERAPY.  URINALYSIS, ROUTINE W REFLEX MICROSCOPIC  Status: Abnormal   Collection Time    10/15/12  9:36 AM      Result Value Range   Color, Urine YELLOW  YELLOW   APPearance CLEAR  CLEAR   Specific Gravity, Urine 1.035 (*) 1.005 - 1.030   pH 7.0  5.0 - 8.0   Glucose, UA NEGATIVE  NEGATIVE mg/dL   Hgb urine dipstick MODERATE (*) NEGATIVE   Bilirubin Urine NEGATIVE  NEGATIVE   Ketones, ur NEGATIVE  NEGATIVE mg/dL   Protein, ur 30 (*) NEGATIVE mg/dL   Urobilinogen, UA 1.0   0.0 - 1.0 mg/dL   Nitrite NEGATIVE  NEGATIVE   Leukocytes, UA MODERATE (*) NEGATIVE  URINE MICROSCOPIC-ADD ON     Status: None   Collection Time    10/15/12  9:36 AM      Result Value Range   WBC, UA 11-20  <3 WBC/hpf   RBC / HPF 7-10  <3 RBC/hpf   Bacteria, UA RARE  RARE   Urine-Other MUCOUS PRESENT     Ct Abdomen Pelvis W Contrast  10/15/2012  *RADIOLOGY REPORT*  Clinical Data: Status post nephrolithotomy 10/07/2012 for staghorn calculus on the right.  Status post cystoscopy 10/05/2012. Coagulation disorder.  Bleeding from incision site.  CT ABDOMEN AND PELVIS WITH CONTRAST  Technique:  Multidetector CT imaging of the abdomen and pelvis was performed following the standard protocol during bolus administration of intravenous contrast.  Contrast: OMNIPAQUE IOHEXOL 300 MG/ML  SOLN  Comparison: CT abdomen pelvis 07/30/2012.  Findings: Atelectatic change is seen in the right lung base.  The lung bases are otherwise clear.  The patient has a retroperitoneal hematoma posterior to the right kidney.  The hematoma measures 9.6 cm transverse by 5.2 cm AP by 21 cm cranial-caudal and is of mixed attenuation.  There is no active extravasation present.  A single locule of gas is seen within the hematoma.  The hematoma causes mild mass effect on the right kidney.  Since the prior study, the patient has undergone nephrolithotomy on the right.  There are some residual stone fragments in the lower pole of the right kidney.  These fragments are difficult to distinguish from a right double-J ureteral stent but the largest fragment appears to measure approximately 1.6 cm cranial-caudal. There is no right hydronephrosis.  The left kidney is unremarkable.  The liver, gallbladder, liver, biliary tree and spleen appear normal.  The pancreas is unremarkable.  Scattered small retroperitoneal lymph nodes are unchanged.  The stomach, small and large bowel and appendix appear normal.  No focal bony abnormality is identified.   IMPRESSION:  1.  Large retroperitoneal hematoma on the right causes some mass effect on the right kidney.  No active extravasation of contrast material is present.  Tiny locule of gas in the superior aspect of the collection is likely postoperative rather than indicative of infection. 2.  Status post nephrolithotomy on the right with a double J ureteral stent now in place.  There is no hydronephrosis but there are some residual stone fragments in the lower pole right kidney.   Original Report Authenticated By: Holley Dexter, M.D.     Review of Systems  Constitutional: Positive for fever and malaise/fatigue.  HENT: Negative.   Eyes: Negative.   Respiratory: Negative.   Cardiovascular: Negative.   Gastrointestinal: Negative.   Genitourinary: Positive for hematuria.  Musculoskeletal: Negative.   Skin: Negative.   Neurological: Negative.  Negative for dizziness.  Endo/Heme/Allergies: Negative.   Psychiatric/Behavioral: Negative.     Blood  pressure 118/47, pulse 116, temperature 100 F (37.8 C), temperature source Oral, resp. rate 18, height 5\' 5"  (1.651 m), weight 92.171 kg (203 lb 3.2 oz), SpO2 97.00%. Physical Exam  Constitutional: He is oriented to person, place, and time. He appears well-developed and well-nourished.  HENT:  Head: Normocephalic and atraumatic.  Eyes: EOM are normal. Pupils are equal, round, and reactive to light.  Neck: Normal range of motion. Neck supple.  Cardiovascular: Normal rate.   Respiratory: Effort normal.  GI: Soft. Bowel sounds are normal.  Genitourinary: Penis normal.  Previous Rt PCNL site with grape-colored sangiuous drainage. No pus. No bright red blood. No foul odor.  Musculoskeletal: Normal range of motion.  Neurological: He is alert and oriented to person, place, and time.  Skin: Skin is warm and dry.  Psychiatric: He has a normal mood and affect. His behavior is normal. Judgment and thought content normal.     Assessment/Plan  1 - Rt  Retroperitoneal Hematoma and Fever After Percutaneous Nephrostolithotomy - Hgb stable and with no active bleeding or urine leak by imaging suggests drainage from resolving perinephric hematoma that probably occurred at time of surgery. Fever raises concern of possible concomitant infection. Will place on empiric ABX with Zosyn and obtain UCX. Removed stitches and obtained WCX at skin to promote drainage of hematoma and check daily CBC.    Will plan for DC when hgb stable and afebrile with finalized urine and wound cultures.   Nirvan Laban 10/15/2012, 1:30 PM

## 2012-10-15 NOTE — ED Notes (Signed)
Pt states he is a Jehov[employer] Witness and he does not wish to receive any blood products

## 2012-10-15 NOTE — ED Notes (Signed)
Report to Julia Rn.

## 2012-10-15 NOTE — ED Provider Notes (Signed)
History     CSN: [id number]  Arrival date & time 10/15/12  0818   First MD Initiated Contact with Patient 10/15/12 0820      Chief Complaint  Patient presents with  . Coagulation Disorder    (Consider location/radiation/quality/duration/timing/severity/associated sxs/prior treatment) The history is provided by the patient.   patient here complaining of bleeding from his incision site x1 day. Denies any dysuria but does note some dark urine. Patient notes fever at home up to 102. No vomiting or diarrhea. Patient recently had urological surgery due to a kidney stone. No start red blood from his incision site is soaked through his bandage. Denies any cough or shortness of breath. Symptoms have been constant and getting worse. Nothing makes them better and no treatment used prior to arrival  Past Medical History  Diagnosis Date  . COPD (chronic obstructive pulmonary disease)   . Hypertension   . Stroke sept 26,  20006     hx mini strokes in past, 1 large stroke  . Arthritis   . High cholesterol   . 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 Left ey surgery  age 29  . Nephrolithotomy Right 10/05/2012    Procedure: NEPHROLITHOTOMY PERCUTANEOUS;  Surgeon: [name], MD;  Location: [facility];  Service: Urology;  Laterality: Right;  WITH CYSTO AND SURGEON ACCESS   . Cystoscopy N/A 10/05/2012    Procedure: CYSTOSCOPY, retrograde and right stent placement;  Surgeon: [name], MD;  Location: [facility];  Service: Urology;  Laterality: N/A;  . Nephrolithotomy Right 10/07/2012    Procedure: NEPHROLITHOTOMY PERCUTANEOUS SECOND LOOK. Stent exchange,Ureteroscopy ;  Surgeon: [name], MD;  Location: [facility];  Service: Urology;  Laterality: Right;    No family history on file.  History  Substance Use Topics  . Smoking status: Current Every Day Smoker -- 0.50 packs/day for 15 years    Types:  Cigarettes  . Smokeless tobacco: Never Used  . Alcohol Use: No      Review of Systems  All other systems reviewed and are negative.    Allergies  Review of patient's allergies indicates no known allergies.  Home Medications   Current Outpatient Rx  Name  Route  Sig  Dispense  Refill  . Albuterol Sulfate (PROAIR HFA IN)   Inhalation   Inhale into the lungs as needed.         [name] allopurinol (ZYLOPRIM) 300 MG tablet   Oral   Take 300 mg by mouth every morning.          . baclofen (LIORESAL) 10 MG tablet   Oral   Take 10 mg by mouth 3 (three) times daily.         . cephALEXin (KEFLEX) 250 MG capsule   Oral   Take 1 capsule (250 mg total) by mouth 2 (two) times daily. X 3 days. Begin day prior to next urology appointment.   6 capsule   0   . cetirizine (ZYRTEC) 1 MG/ML syrup   Oral   Take by mouth every morning.         . cetirizine (ZYRTEC) 10 MG tablet   Oral   Take 10 mg by mouth every morning.          . colchicine 0.6 MG tablet   Oral   Take 0.6 mg by mouth daily.         [name]  diclofenac (VOLTAREN) 50 MG EC tablet   Oral   Take 50 mg by mouth 2 (two) times daily.         . fluticasone (FLONASE) 50 MCG/ACT nasal spray   Nasal   Place 2 sprays into the nose daily.         [name] omeprazole (PRILOSEC) 20 MG capsule   Oral   Take 20 mg by mouth 2 (two) times daily.         [name] oxyCODONE-acetaminophen (ROXICET) 5-325 MG per tablet   Oral   Take 1 tablet by mouth every 4 (four) hours as needed for pain.   30 tablet   0   . sennosides-docusate sodium (SENOKOT-S) 8.6-50 MG tablet   Oral   Take 1 tablet by mouth 2 (two) times daily. While taking pain meds to prevent constipation.   30 tablet   1   . simvastatin (ZOCOR) 20 MG tablet   Oral   Take 20 mg by mouth at bedtime.         . valsartan-hydrochlorothiazide (DIOVAN-HCT) 320-12.5 MG per tablet   Oral   Take 1 tablet by mouth daily before breakfast.           BP 144/97  Pulse 117   Temp(Src) 99.9 F (37.7 C) (Oral)  Resp 20  SpO2 98%  Physical Exam  Nursing note and vitals reviewed. Constitutional: He is oriented to person, place, and time. He appears well-developed and well-nourished.  Non-toxic appearance. No distress.  HENT:  Head: Normocephalic and atraumatic.  Eyes: Conjunctivae, EOM and lids are normal. Pupils are equal, round, and reactive to light.  Neck: Normal range of motion. Neck supple. No tracheal deviation present. No mass present.  Cardiovascular: Regular rhythm and normal heart sounds.  Tachycardia present.  Exam reveals no gallop.   No murmur heard. Pulmonary/Chest: Effort normal and breath sounds normal. No stridor. No respiratory distress. He has no decreased breath sounds. He has no wheezes. He has no rhonchi. He has no rales.  Abdominal: Soft. Normal appearance and bowel sounds are normal. He exhibits no distension. There is no tenderness. There is no rebound and no CVA tenderness.  Incision site noted at right flank area with dark red blood draining.  Musculoskeletal: Normal range of motion. He exhibits no edema and no tenderness.  Neurological: He is alert and oriented to person, place, and time. He has normal strength. No cranial nerve deficit or sensory deficit. GCS eye subscore is 4. GCS verbal subscore is 5. GCS motor subscore is 6.  Skin: Skin is warm and dry. No abrasion and no rash noted.  Psychiatric: He has a normal mood and affect. His speech is normal and behavior is normal.    ED Course  Procedures (including critical care time)  Labs Reviewed - No data to display No results found.   No diagnosis found.    MDM  Spoke with patient's urologist who recommended that the patient receive a CT of the abdomen with IV contrast to rule out source of bleeding. X-ray has been ordered and lab work is pending.   9:57 AM Pt with retroperitoneal hematoma, spoke with dr. Berneice Heinrich, will start abx for fever and pt to be  admitted     Toy Baker, MD 10/15/12 [phone]

## 2012-10-16 LAB — CBC WITH DIFFERENTIAL/PLATELET
Basophils Absolute: 0 10*3/uL (ref 0.0–0.1)
Basophils Relative: 1 % (ref 0–1)
Eosinophils Absolute: 0.2 10*3/uL (ref 0.0–0.7)
Eosinophils Relative: 3 % (ref 0–5)
MCH: 29.4 pg (ref 26.0–34.0)
MCV: 89.4 fL (ref 78.0–100.0)
Neutrophils Relative %: 45 % (ref 43–77)
Platelets: 479 10*3/uL — ABNORMAL HIGH (ref 150–400)
RBC: 3.3 MIL/uL — ABNORMAL LOW (ref 4.22–5.81)
RDW: 16 % — ABNORMAL HIGH (ref 11.5–15.5)

## 2012-10-16 MED ORDER — ALUM & MAG HYDROXIDE-SIMETH 200-200-20 MG/5ML PO SUSP
30.0000 mL | ORAL | Status: DC | PRN
  Administered 2012-10-16 – 2012-10-17 (×4): 30 mL via ORAL
  Filled 2012-10-16 (×4): qty 30

## 2012-10-16 NOTE — Progress Notes (Signed)
   CARE MANAGEMENT NOTE 10/16/2012  Patient:  [name], [name]   Account Number:  [id number]  Date Initiated:  10/16/2012  Documentation initiated by:  Jiles Crocker  Subjective/Objective Assessment:   ADMITTED WITH Fever After Percutaneous Nephrostolithotomy     Action/Plan:   PCP IS DR AVBUERE; LIVES ALONE, HAS A PERSONAL CARE SERVICE WITH S&L; CM FOLLOWING FOR DCP; POSSIBLY NEED HHC AT DISCHARGE - HHRN;   Anticipated DC Date:  10/21/2012   Anticipated DC Plan:  HOME W HOME HEALTH SERVICES      DC Planning Services  CM consult             Status of service:  In process, will continue to follow Medicare Important Message given?  NA - LOS <3 / Initial given by admissions (If response is "NO", the following Medicare IM given date fields will be blank) Per UR Regulation:  Reviewed for med. necessity/level of care/duration of stay Comments:  10/16/2012- B Cayli Escajeda RN,BSN,MHA

## 2012-10-16 NOTE — Progress Notes (Signed)
Subjective: Patient reports Feels better today.  Has less flank pain. Tolerates diet well. Voids well.  Urine grossly clear.  Objective: Vital signs in last 24 hours: Temp:  [98 F (36.7 C)-100 F (37.8 C)] 98 F (36.7 C) (04/18 0428) Pulse Rate:  [89-116] 89 (04/18 0428) Resp:  [16-22] 20 (04/18 0428) BP: (94-122)/(47-73) 122/73 mmHg (04/18 0428) SpO2:  [94 %-100 %] 100 % (04/18 0428) Weight:  [92.171 kg (203 lb 3.2 oz)] 92.171 kg (203 lb 3.2 oz) (04/17 1145)  Intake/Output from previous day: 04/17 0701 - 04/18 0700 In: 563.3 [P.O.:240; I.V.:273.3; IV Piggyback:50] Out: 325 [Urine:325] Intake/Output this shift: Total I/O In: 360 [P.O.:360] Out: 200 [Urine:200]  Physical Exam:  Abdomen: Soft, obese. Mild right flank tenderness.  No flank mass. Drainage right PCN: minimal. Wound and urine culture: No growth after one day.  Lab Results:  Recent Labs  10/15/12 0845 10/16/12 0433  HGB 10.4* 9.7*  HCT 30.8* 29.5*   BMET  Recent Labs  10/15/12 0845  NA 135  K 3.7  CL 99  CO2 24  GLUCOSE 134*  BUN 15  CREATININE 0.92  CALCIUM 8.9    Recent Labs  10/15/12 0845  INR 1.09   No results found for this basename: LABURIN,  in the last 72 hours Results for orders placed during the hospital encounter of 10/15/12  WOUND CULTURE     Status: None   Collection Time    10/15/12  2:20 PM      Result Value Range Status   Specimen Description WOUND RETROPERITONEAL HEMATOMA   Final   Special Requests Normal   Final   Gram Stain     Final   Value: MODERATE WBC PRESENT,BOTH PMN AND MONONUCLEAR     NO ORGANISMS SEEN   Culture NO GROWTH 1 DAY   Final   Report Status PENDING   Incomplete    Studies/Results: Ct Abdomen Pelvis W Contrast  10/15/2012  *RADIOLOGY REPORT*  Clinical Data: Status post nephrolithotomy 10/07/2012 for staghorn calculus on the right.  Status post cystoscopy 10/05/2012. Coagulation disorder.  Bleeding from incision site.  CT ABDOMEN AND PELVIS  WITH CONTRAST  Technique:  Multidetector CT imaging of the abdomen and pelvis was performed following the standard protocol during bolus administration of intravenous contrast.  Contrast: OMNIPAQUE IOHEXOL 300 MG/ML  SOLN  Comparison: CT abdomen pelvis 07/30/2012.  Findings: Atelectatic change is seen in the right lung base.  The lung bases are otherwise clear.  The patient has a retroperitoneal hematoma posterior to the right kidney.  The hematoma measures 9.6 cm transverse by 5.2 cm AP by 21 cm cranial-caudal and is of mixed attenuation.  There is no active extravasation present.  A single locule of gas is seen within the hematoma.  The hematoma causes mild mass effect on the right kidney.  Since the prior study, the patient has undergone nephrolithotomy on the right.  There are some residual stone fragments in the lower pole of the right kidney.  These fragments are difficult to distinguish from a right double-J ureteral stent but the largest fragment appears to measure approximately 1.6 cm cranial-caudal. There is no right hydronephrosis.  The left kidney is unremarkable.  The liver, gallbladder, liver, biliary tree and spleen appear normal.  The pancreas is unremarkable.  Scattered small retroperitoneal lymph nodes are unchanged.  The stomach, small and large bowel and appendix appear normal.  No focal bony abnormality is identified.  IMPRESSION:  1.  Large retroperitoneal hematoma  on the right causes some mass effect on the right kidney.  No active extravasation of contrast material is present.  Tiny locule of gas in the superior aspect of the collection is likely postoperative rather than indicative of infection. 2.  Status post nephrolithotomy on the right with a double J ureteral stent now in place.  There is no hydronephrosis but there are some residual stone fragments in the lower pole right kidney.   Original Report Authenticated By: Holley Dexter, M.D.     Assessment/Plan:  Retroperitoneal  hematoma  S/P PCNL on 4/9  Continue Zosyn.  OOB  Recheck H&H in AM   LOS: 1 day   Corretta Munce-HENRY 10/16/2012, 10:38 AM
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
## 2012-10-17 LAB — CBC WITH DIFFERENTIAL/PLATELET
Basophils Absolute: 0.1 10*3/uL (ref 0.0–0.1)
Eosinophils Relative: 4 % (ref 0–5)
Lymphocytes Relative: 24 % (ref 12–46)
Lymphs Abs: 2.7 10*3/uL (ref 0.7–4.0)
MCV: 89.5 fL (ref 78.0–100.0)
Neutro Abs: 6.9 10*3/uL (ref 1.7–7.7)
Neutrophils Relative %: 61 % (ref 43–77)
Platelets: 467 10*3/uL — ABNORMAL HIGH (ref 150–400)
RBC: 3.33 MIL/uL — ABNORMAL LOW (ref 4.22–5.81)
RDW: 15.9 % — ABNORMAL HIGH (ref 11.5–15.5)
WBC: 11.4 10*3/uL — ABNORMAL HIGH (ref 4.0–10.5)

## 2012-10-17 LAB — WOUND CULTURE
Culture: NO GROWTH
Special Requests: NORMAL

## 2012-10-17 MED ORDER — PANTOPRAZOLE SODIUM 40 MG PO TBEC
80.0000 mg | DELAYED_RELEASE_TABLET | Freq: Every day | ORAL | Status: DC
  Administered 2012-10-17: 80 mg via ORAL
  Filled 2012-10-17: qty 2

## 2012-10-17 NOTE — Progress Notes (Signed)
  Subjective: Patient reports his chief complaint today was that of heartburn. He does still have some pain in his right flank. He says it is worse when he coughs.   Objective: Vital signs in last 24 hours: Temp:  [97.8 F (36.6 C)-98.4 F (36.9 C)] 97.8 F (36.6 C) (04/19 0558) Pulse Rate:  [92-95] 95 (04/19 0558) Resp:  [20-24] 20 (04/19 0558) BP: (109-114)/(56-74) 114/74 mmHg (04/19 0558) SpO2:  [100 %] 100 % (04/19 0558)  Intake/Output from previous day: 04/18 0701 - 04/19 0700 In: 3086.7 [P.O.:600; I.V.:2236.7; IV Piggyback:250] Out: 2075 [Urine:2075] Intake/Output this shift:    Physical Exam:  Minimal drainage is noted from his nephrostomy site.  Lab Results:  Recent Labs  10/15/12 0845 10/16/12 0433 10/17/12 0542  HGB 10.4* 9.7* 9.9*  HCT 30.8* 29.5* 29.8*   BMET  Recent Labs  10/15/12 0845  NA 135  K 3.7  CL 99  CO2 24  GLUCOSE 134*  BUN 15  CREATININE 0.92  CALCIUM 8.9    Recent Labs  10/15/12 0845  INR 1.09   No results found for this basename: LABURIN,  in the last 72 hours Results for orders placed during the hospital encounter of 10/15/12  WOUND CULTURE     Status: None   Collection Time    10/15/12  2:20 PM      Result Value Range Status   Specimen Description WOUND RETROPERITONEAL HEMATOMA   Final   Special Requests Normal   Final   Gram Stain     Final   Value: MODERATE WBC PRESENT,BOTH PMN AND MONONUCLEAR     NO ORGANISMS SEEN   Culture NO GROWTH 2 DAYS   Final   Report Status 10/17/2012 FINAL   Final  URINE CULTURE     Status: None   Collection Time    10/15/12  9:51 PM      Result Value Range Status   Specimen Description URINE, CLEAN CATCH   Final   Special Requests Normal   Final   Culture  Setup Time 10/16/2012 02:18   Final   Colony Count PENDING   Incomplete   Culture Culture reincubated for better growth   Final   Report Status PENDING   Incomplete    Studies/Results: No results found.  Assessment/Plan: He  seems to be doing better. His hemoglobin has remained stable. His drainage has decreased from his nephrostomy site. The culture of the drainage returned negative. His urine culture remains pending. He remains afebrile with a normal white count. He indicates that he has an aid who comes in daily since he lives alone. I need to be sure someone can assist him with dressing changes and anticipate discharge in the morning.  Anticipate discharge in the morning.  Proton pump inhibitor.   LOS: 2 days   Alter Moss C 10/17/2012, 9:37 AM

## 2012-10-18 LAB — CBC WITH DIFFERENTIAL/PLATELET
Basophils Absolute: 0 10*3/uL (ref 0.0–0.1)
HCT: 29.7 % — ABNORMAL LOW (ref 39.0–52.0)
Lymphocytes Relative: 23 % (ref 12–46)
Lymphs Abs: 2.8 10*3/uL (ref 0.7–4.0)
MCV: 89.2 fL (ref 78.0–100.0)
Monocytes Absolute: 1.2 10*3/uL — ABNORMAL HIGH (ref 0.1–1.0)
Neutro Abs: 8.2 10*3/uL — ABNORMAL HIGH (ref 1.7–7.7)
RBC: 3.33 MIL/uL — ABNORMAL LOW (ref 4.22–5.81)
RDW: 16 % — ABNORMAL HIGH (ref 11.5–15.5)
WBC: 12.6 10*3/uL — ABNORMAL HIGH (ref 4.0–10.5)

## 2012-10-18 LAB — URINE CULTURE: Special Requests: NORMAL

## 2012-10-18 NOTE — Discharge Summary (Signed)
Physician Discharge Summary  Patient ID: [name] MRN: [id number] DOB/AGE: 54/11/1960 54 y.o.  Admit date: 10/15/2012 Discharge date: 10/18/2012  Admission Diagnoses: 1. Retroperitoneal bleed after cutaneous nephrolithotomy. 2. Fever  Discharge Diagnoses:  Same  Discharged Condition: good  Hospital Course: The patient was admitted to the hospital with retroperitoneal/perinephric bleeding and fever after having undergone a percutaneous nephrolithotomy on 10/07/12. A CT scan revealed a hematoma without any extravasation of urine or active bleeding. He was admitted to the hospital and placed on broad-spectrum antibiotics after his urine was cultured and the suture removed from his percutaneous nephrolithotomy site was removed and fluid from this was cultured as well. He has continued to do well throughout his hospitalization with no fever. His hemoglobin has remained stable. His wound culture has returned negative. His urine culture remains pending at this time.   Significant Diagnostic Studies: CT scan at about  Discharge Exam: Blood pressure 113/75, pulse 92, temperature 97.2 F (36.2 C), temperature source Oral, resp. rate 20, height 5\' 5"  (1.651 m), weight 92.171 kg (203 lb 3.2 oz), SpO2 100.00%. His abdomen is soft and nontender. His right flank percutaneous nephrolithotomy site appears to have no evidence of active bleeding or discharge. There is no erythema or induration. He has no flank ecchymoses.  Disposition: 01-Home or Self Care He will be discharged home today. I will maintain him on his preoperative medications. Since his urine culture is pending I will followup on this and contact him if further antibiotics are necessary or his current medications needs to be changed in any way.     Medication List    TAKE these medications       allopurinol 300 MG tablet  Commonly known as:  ZYLOPRIM  Take 300 mg by mouth every morning.     baclofen 10 MG tablet  Commonly known  as:  LIORESAL  Take 10 mg by mouth 3 (three) times daily.     cephALEXin 250 MG capsule  Commonly known as:  KEFLEX  Take 1 capsule (250 mg total) by mouth 2 (two) times daily. X 3 days. Begin day prior to next urology appointment.     cetirizine 10 MG tablet  Commonly known as:  ZYRTEC  Take 10 mg by mouth every morning.     colchicine 0.6 MG tablet  Take 0.6 mg by mouth daily.     diclofenac 50 MG EC tablet  Commonly known as:  VOLTAREN  Take 50 mg by mouth 2 (two) times daily.     fluticasone 50 MCG/ACT nasal spray  Commonly known as:  FLONASE  Place 2 sprays into the nose daily.     omeprazole 20 MG capsule  Commonly known as:  PRILOSEC  Take 20 mg by mouth 2 (two) times daily.     oxyCODONE-acetaminophen 5-325 MG per tablet  Commonly known as:  ROXICET  Take 1 tablet by mouth every 4 (four) hours as needed for pain.     PROAIR HFA IN  Inhale into the lungs as needed.     sennosides-docusate sodium 8.6-50 MG tablet  Commonly known as:  SENOKOT-S  Take 1 tablet by mouth 2 (two) times daily. While taking pain meds to prevent constipation.     simvastatin 20 MG tablet  Commonly known as:  ZOCOR  Take 20 mg by mouth at bedtime.     valsartan-hydrochlorothiazide 320-12.5 MG per tablet  Commonly known as:  DIOVAN-HCT  Take 1 tablet by mouth daily before breakfast.  Follow-up Information   Follow up with Donald Ache, MD. (At your previously scheduled appointment time and date.)    Contact information:   [address] [place] [address] [phone]       Signed: [place] 10/18/2012, 7:53 AM

## 2012-11-03 ENCOUNTER — Ambulatory Visit: Payer: Medicaid Other | Attending: Internal Medicine | Admitting: Physical Therapy

## 2012-11-03 DIAGNOSIS — IMO0001 Reserved for inherently not codable concepts without codable children: Secondary | ICD-10-CM | POA: Insufficient documentation

## 2012-11-03 DIAGNOSIS — Z9181 History of falling: Secondary | ICD-10-CM | POA: Insufficient documentation

## 2012-11-03 DIAGNOSIS — R269 Unspecified abnormalities of gait and mobility: Secondary | ICD-10-CM | POA: Insufficient documentation

## 2012-11-06 ENCOUNTER — Ambulatory Visit: Payer: Medicaid Other | Admitting: Physical Therapy

## 2013-08-05 ENCOUNTER — Other Ambulatory Visit: Payer: Self-pay | Admitting: Internal Medicine

## 2013-08-05 DIAGNOSIS — J329 Chronic sinusitis, unspecified: Secondary | ICD-10-CM

## 2013-08-10 ENCOUNTER — Other Ambulatory Visit: Payer: Self-pay | Admitting: Internal Medicine

## 2013-08-10 DIAGNOSIS — R51 Headache: Principal | ICD-10-CM

## 2013-08-10 DIAGNOSIS — R519 Headache, unspecified: Secondary | ICD-10-CM

## 2013-08-11 ENCOUNTER — Ambulatory Visit
Admission: RE | Admit: 2013-08-11 | Discharge: 2013-08-11 | Disposition: A | Discharge status: Home / Self Care | Payer: Medicaid Other | Source: Ambulatory Visit | Attending: Internal Medicine | Admitting: Internal Medicine

## 2013-08-11 DIAGNOSIS — R51 Headache: Principal | ICD-10-CM

## 2013-08-11 DIAGNOSIS — R519 Headache, unspecified: Secondary | ICD-10-CM

## 2014-03-17 ENCOUNTER — Other Ambulatory Visit: Payer: Self-pay | Admitting: Urology

## 2014-04-01 ENCOUNTER — Encounter (HOSPITAL_COMMUNITY): Payer: Self-pay | Admitting: Pharmacy Technician

## 2014-04-03 IMAGING — RF DG RETROGRADE PYELOGRAM
1 series · 7 of 7 positions shown · non-contrast
Comparison: CT on 07/30/2012

CLINICAL DATA: Right renal calculi.

RETROGRADE PYELOGRAM

[Series 1: run · 7 of 7 slices shown]
[im 1/7]
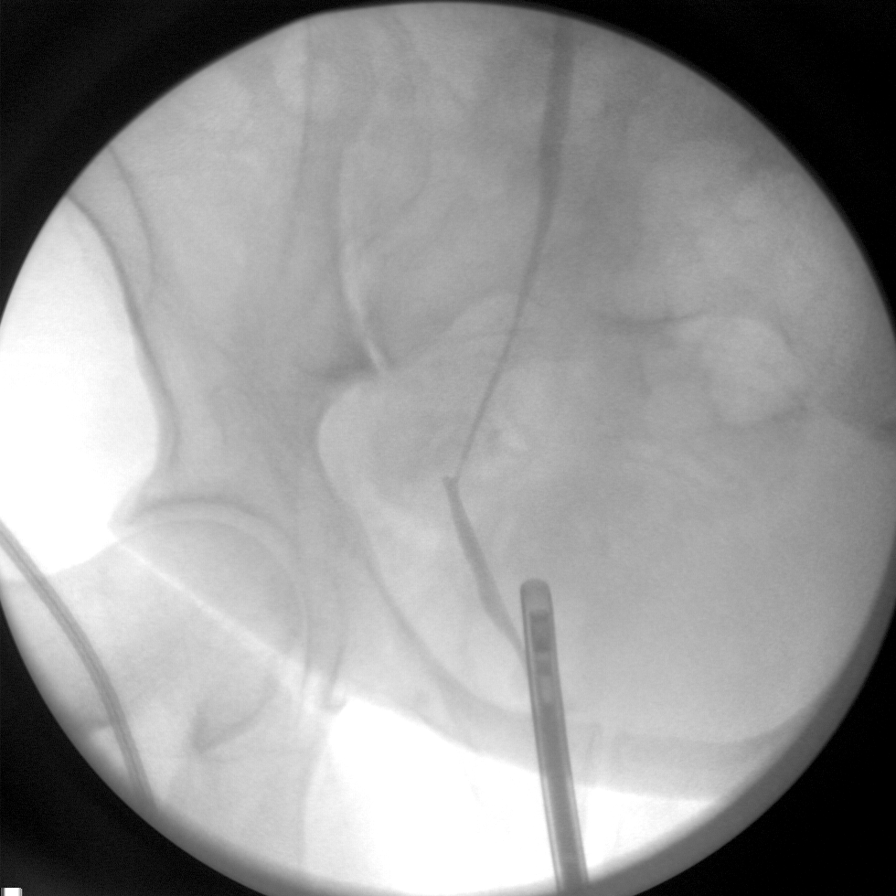
[im 2/7]
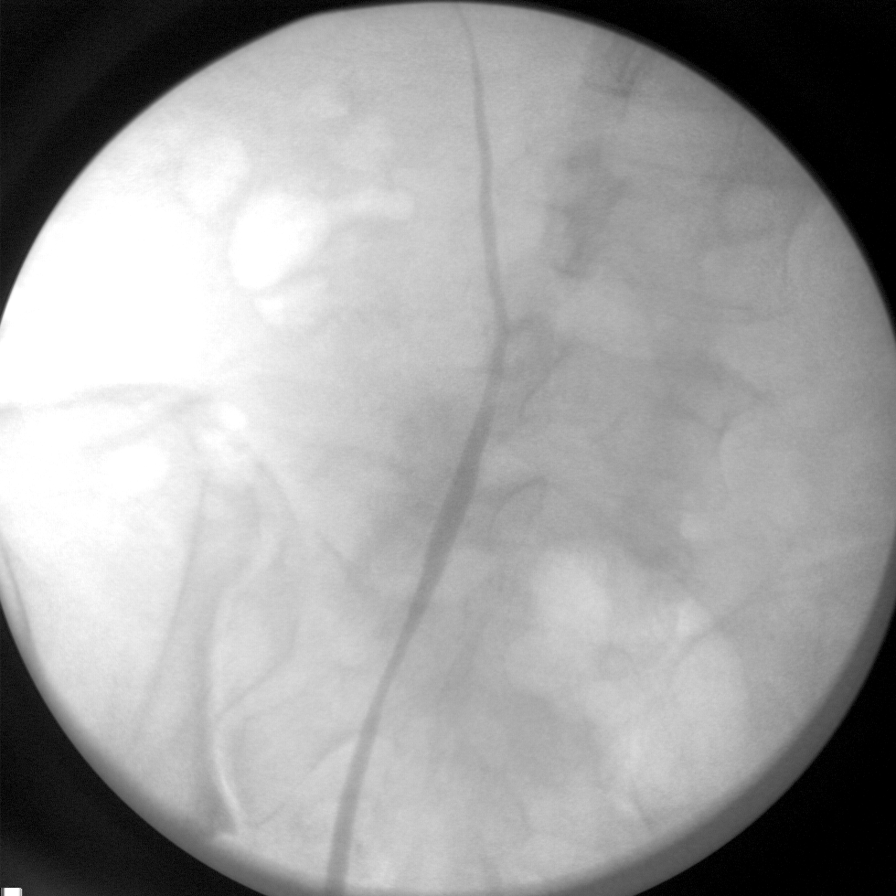
[im 3/7]
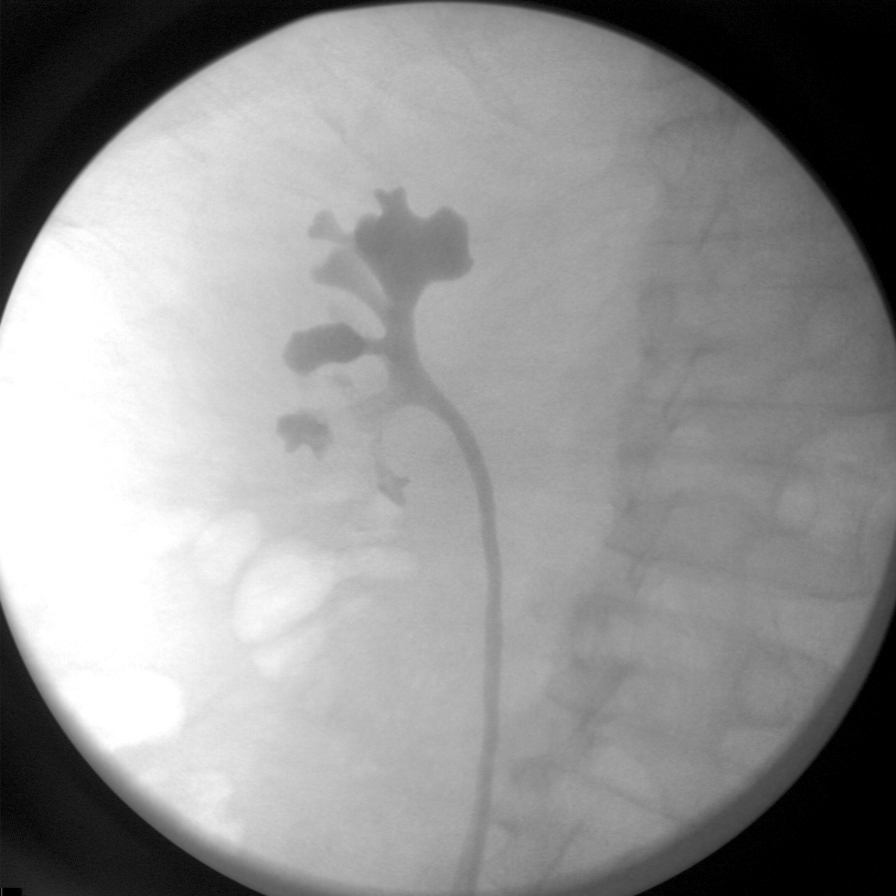
[im 4/7]
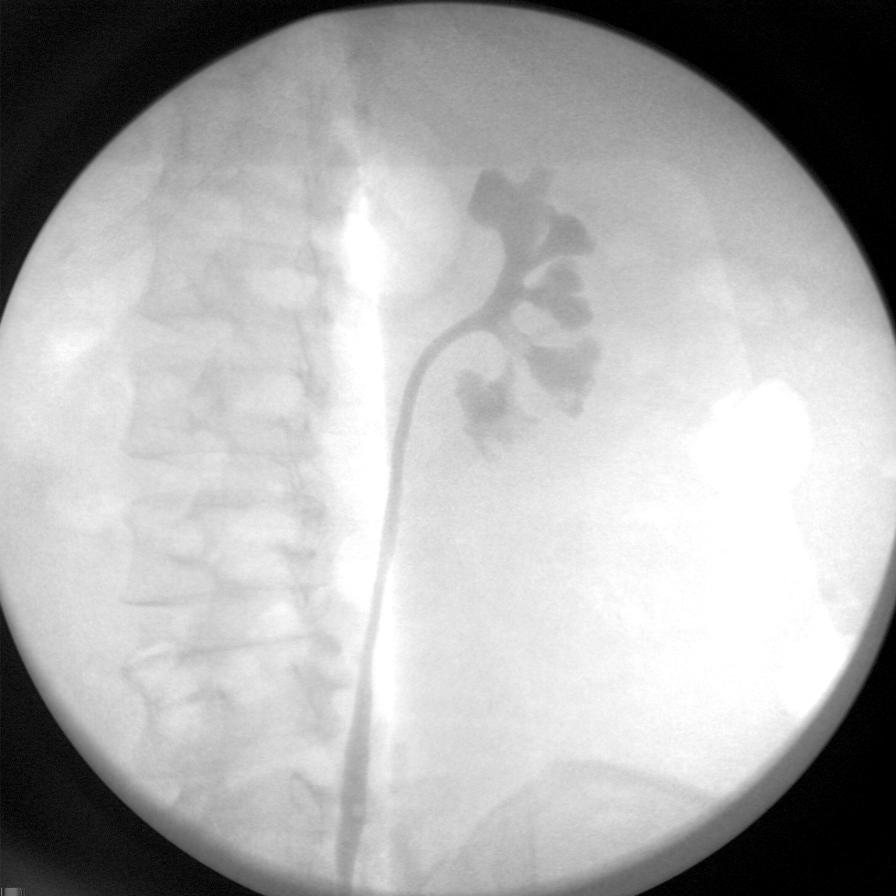
[im 5/7]
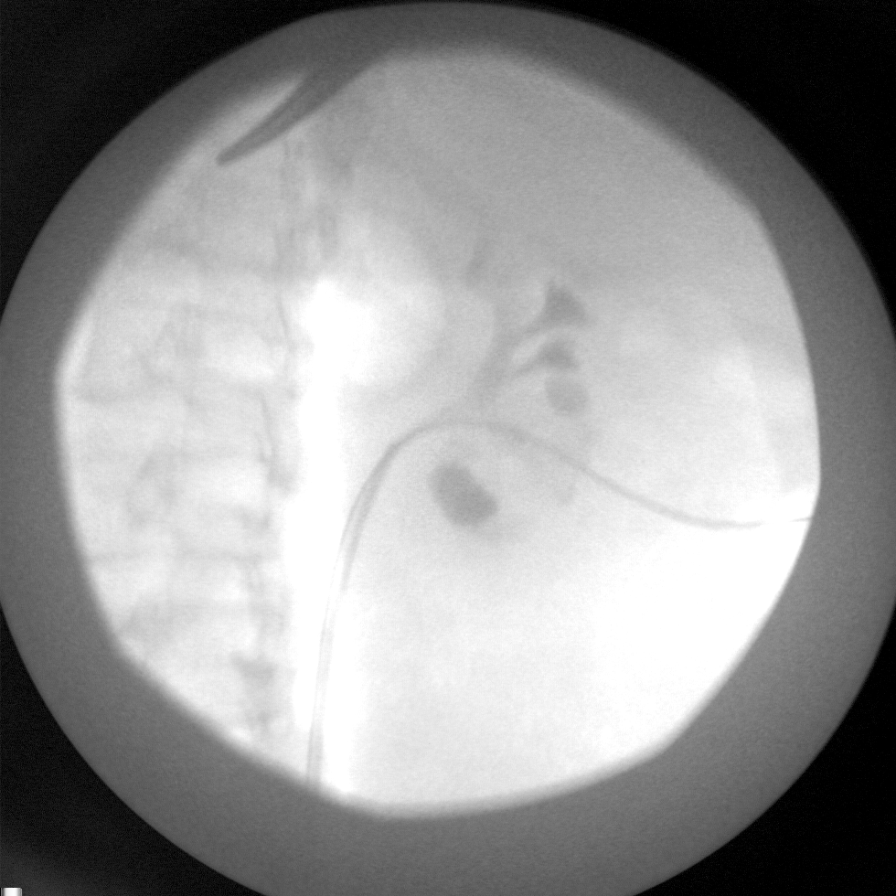
[im 6/7]
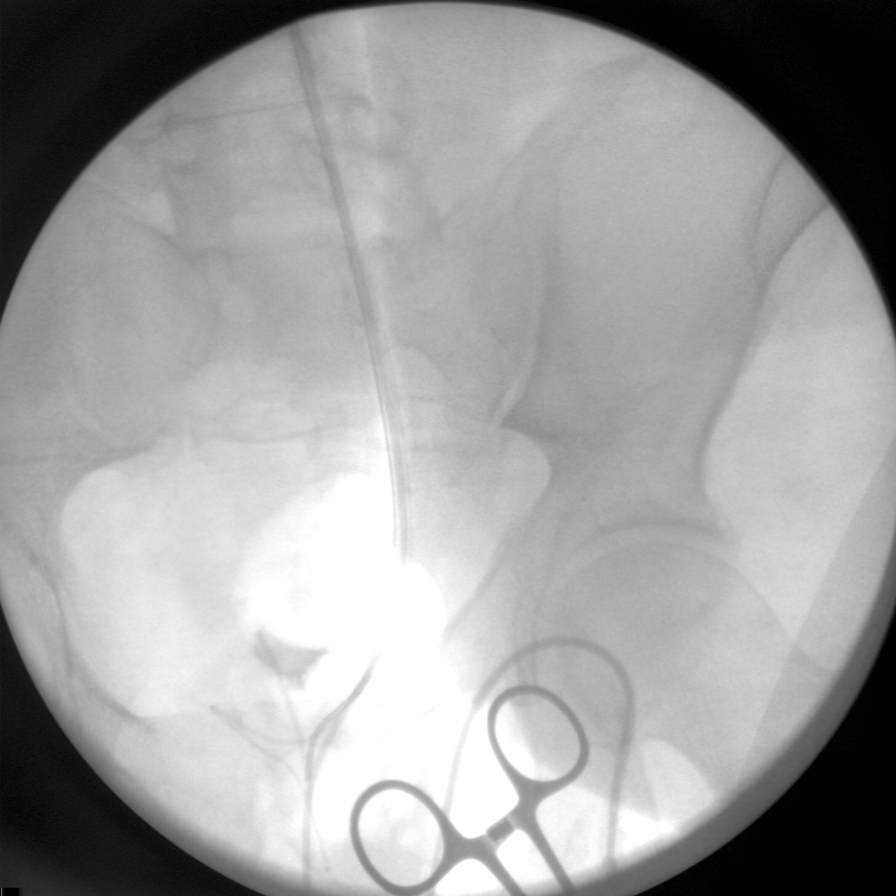
[im 7/7]
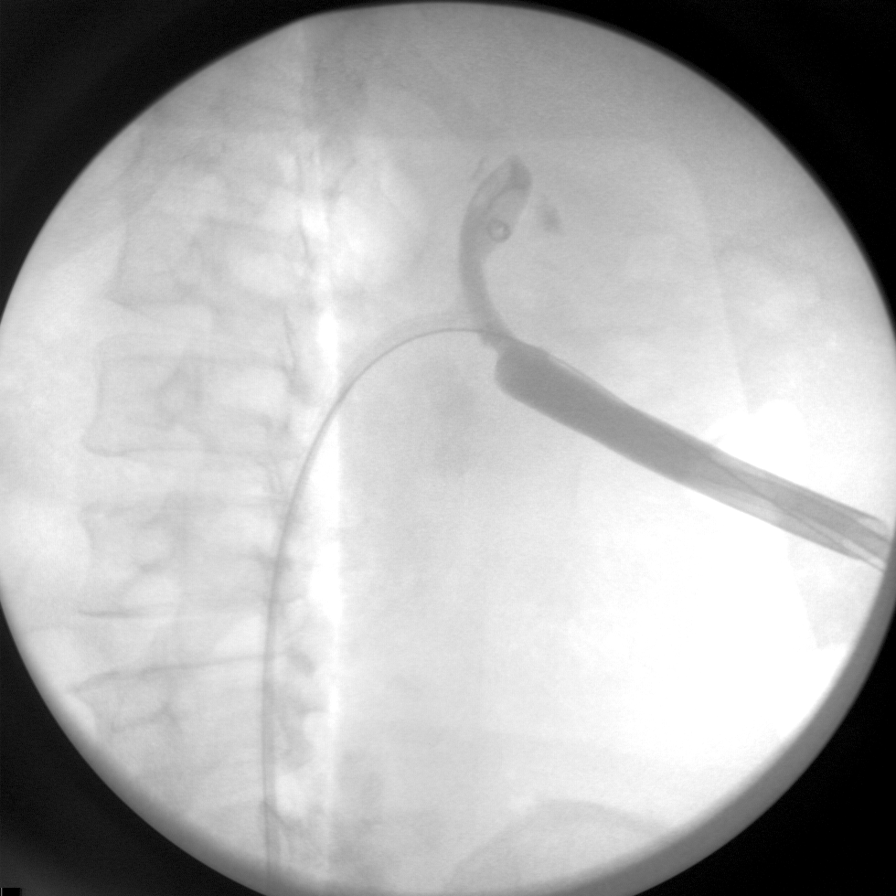

[7 of 7 positions shown; findings below may reference images not displayed]

FINDINGS: Imaging obtained intraoperatively with a C-arm shows
retrograde cannulation of the right ureter.  Retrograde contrast
injection shows a normal appearance to the ureter.  Filling defects
are present in the central right kidney at the level of an
infundibulum and extending into several lower pole calyces.
Percutaneous access was performed with evidence of advancement of a
sheath for percutaneous nephrolithotomy.
IMPRESSION: Retrograde pyelogram shows a right staghorn type calculus occupying
lower pole calyces and a lower pole infundibulum.  Percutaneous
nephrolithotomy was performed.

## 2014-04-05 IMAGING — RF DG ABDOMEN 1V
1 series · 4 of 4 positions shown · non-contrast
Comparison: Retrograde ureteropyelogram 10/05/2012; CT scan
abdomen/pelvis 07/30/2012

CLINICAL DATA: Nephrolithiasis, second look percutaneous
nephrolithotomy

ABDOMEN - 1 VIEW

[Series 1: run · 4 of 4 slices shown]
[im 1/4]
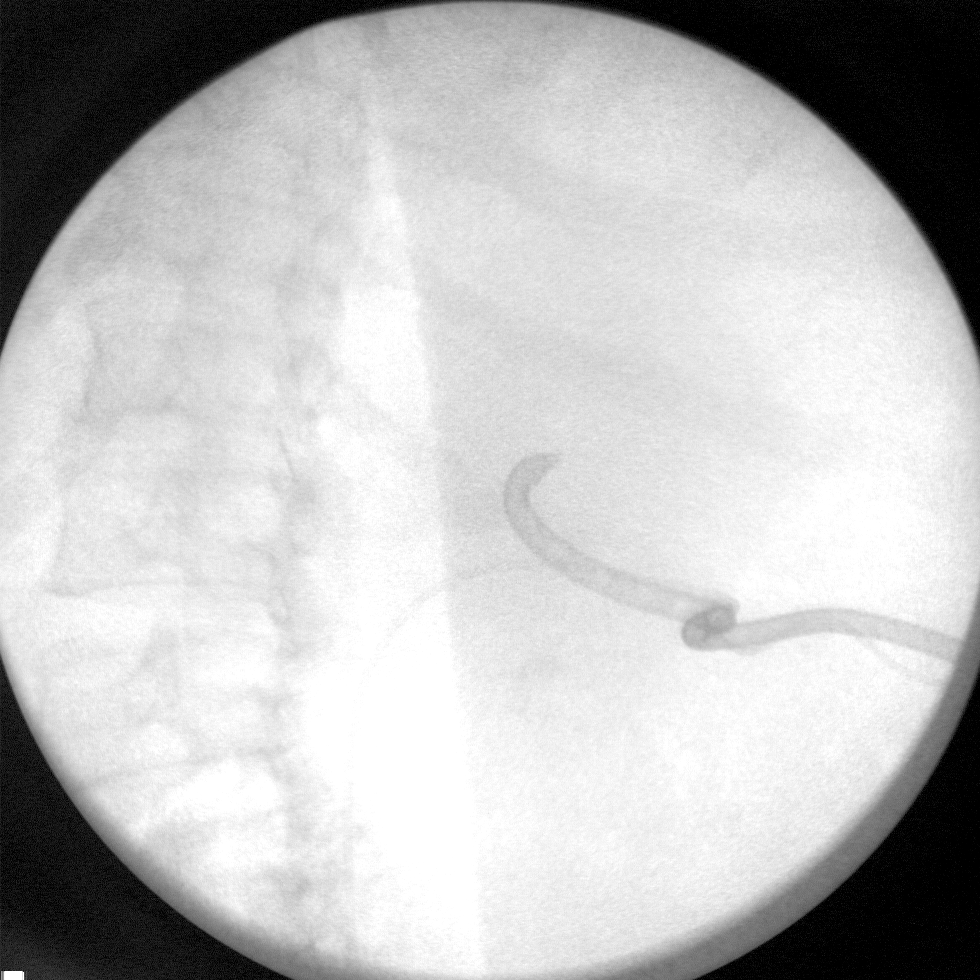
[im 2/4]
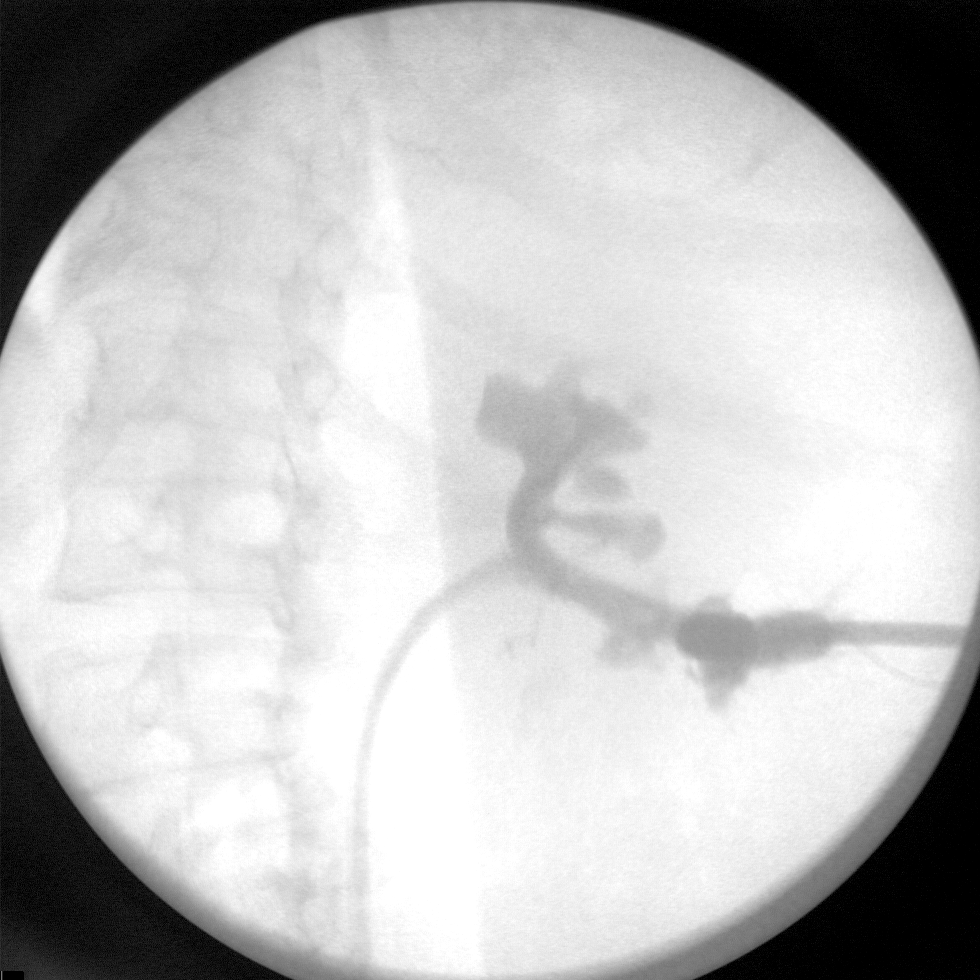
[im 3/4]
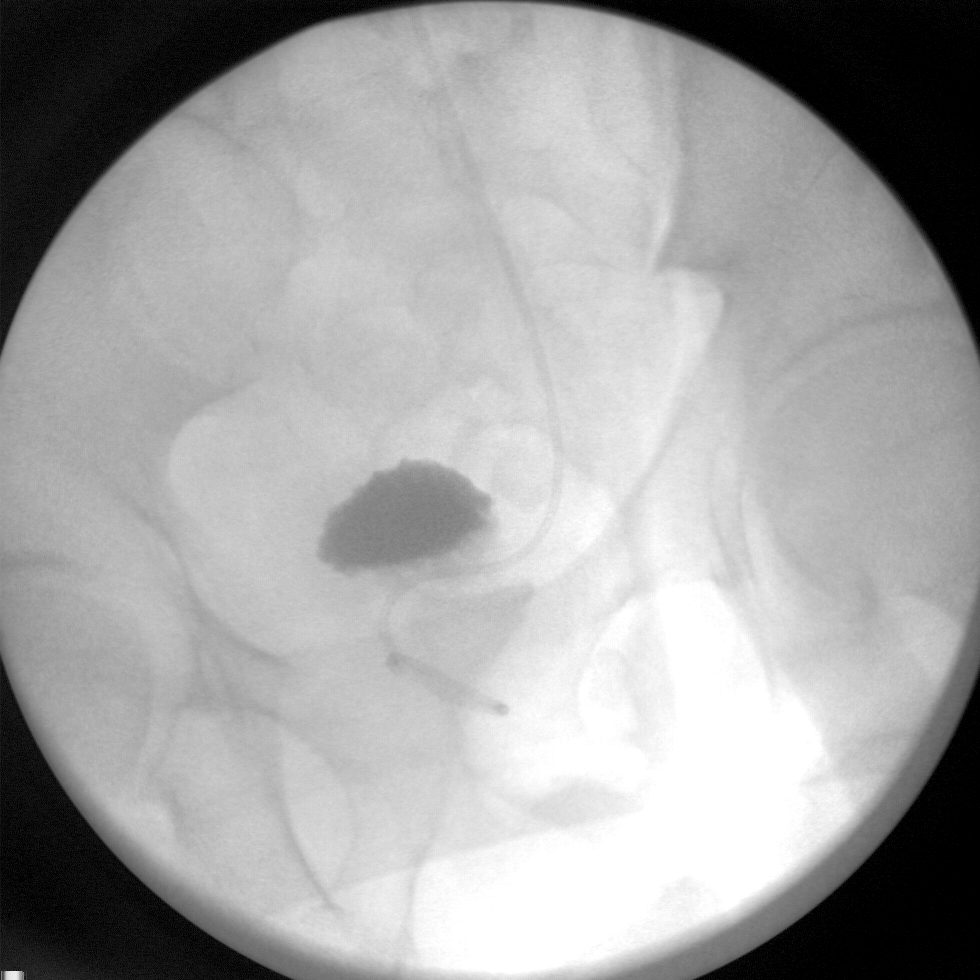
[im 4/4]
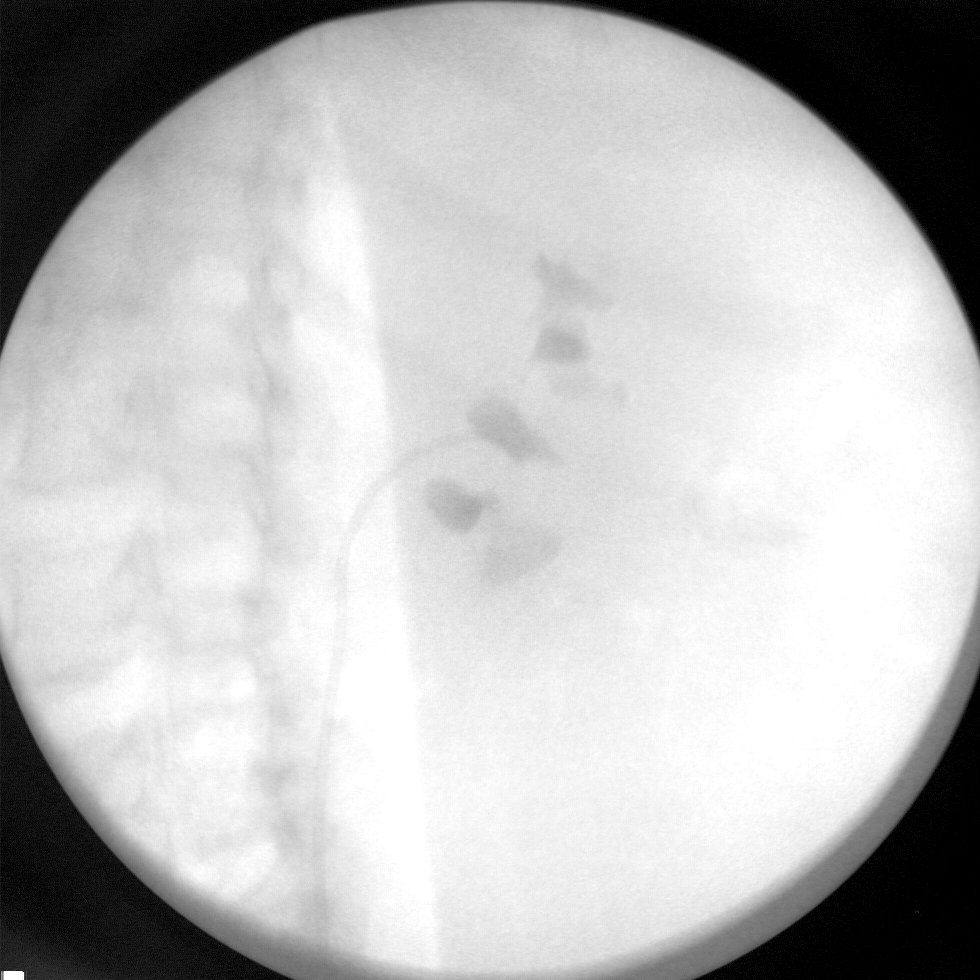

[4 of 4 positions shown; findings below may reference images not displayed]

FINDINGS: Multiple intraoperative spot radiographs obtained during
antegrade nephroureterogram through existing percutaneous
nephrostomy tube.  Procedure performed by urology.  Reported fluoro
time 3 minutes 39 seconds.  The images demonstrate an incompletely
formed percutaneous nephrostomy tube and a 5-French safety catheter
which extends to the level of the bladder.  Contrast material
injected through the tube opacifies the renal collecting system.
There is no hydronephrosis and no definite filling defects to
suggest residual nephrolithiasis.  There is some extravasation of
contrast material along the soft tissue tract.  Contrast material
passes down the ureter and enters the bladder.

The final image demonstrates removal of the percutaneous
nephrostomy tube and safety catheter.  A double J ureteral stent is
left in place with the distal loop appropriately reconstituted the
bladder and the proximal loop incompletely reconstituted in the
renal pelvis.
IMPRESSION: Percutaneous nephroureterogram through existing percutaneous
nephrostomy tube as detailed above followed by removal of
percutaneous nephrostomy tube. A double J ureteral stent remains
within the bladder.

## 2014-04-06 NOTE — Patient Instructions (Addendum)
[name]  04/06/2014                           YOUR PROCEDURE IS SCHEDULED ON: 04/13/14                COME TO [facility]                ARRIVE AT SHORT STAY AT: 10:45 AM               CALL THIS NUMBER IF ANY PROBLEMS THE DAY OF SURGERY :               [phone]                                REMEMBER:   Do not eat food or drink liquids AFTER MIDNIGHT                  Take these medicines the morning of surgery with               A SIPS OF WATER : ALLOPURINOL / BACLOFEN / CITRIZINE / COLCHICINE / OMEPRAZOLE / MAY USE FLONASE NASAL SPRAY        Do not wear jewelry, make-up   Do not wear lotions, powders, or perfumes.   Do not shave legs or underarms 12 hrs. before surgery (men may shave face)  Do not bring valuables to the hospital.  Contacts, dentures or bridgework may not be worn into surgery.  Leave suitcase in the car. After surgery it may be brought to your room.  For patients admitted to the hospital more than one night, checkout time is            11:00 AM                                                       The day of discharge.   Patients discharged the day of surgery will not be allowed to drive home.            If going home same day of surgery, must have someone stay with you              FIRST 24 hrs at home and arrange for some one to drive you              home from hospital.   ________________________________________________________________________                                                                                                  [place]  Before surgery, you can play an important role.  Because skin is not sterile, your skin needs to be as free of germs as possible.  You can reduce the number of germs  on your skin by washing with CHG (chlorahexidine gluconate) soap before surgery.  CHG is an antiseptic cleaner which kills germs and bonds with the skin to continue killing  germs even after washing. Please DO NOT use if you have an allergy to CHG or antibacterial soaps.  If your skin becomes reddened/irritated stop using the CHG and inform your nurse when you arrive at Short Stay. Do not shave (including legs and underarms) for at least 48 hours prior to the first CHG shower.  You may shave your face. Please follow these instructions carefully:   1.  Shower with CHG Soap the night before surgery and the  morning of Surgery.   2.  If you choose to wash your hair, wash your hair first as usual with your  normal  Shampoo.   3.  After you shampoo, rinse your hair and body thoroughly to remove the  shampoo.                                         4.  Use CHG as you would any other liquid soap.  You can apply chg directly  to the skin and wash . Gently wash with scrungie or clean wascloth    5.  Apply the CHG Soap to your body ONLY FROM THE NECK DOWN.   Do not use on open                           Wound or open sores. Avoid contact with eyes, ears mouth and genitals (private parts).                        Genitals (private parts) with your normal soap.              6.  Wash thoroughly, paying special attention to the area where your surgery  will be performed.   7.  Thoroughly rinse your body with warm water from the neck down.   8.  DO NOT shower/wash with your normal soap after using and rinsing off  the CHG Soap .                9.  Pat yourself dry with a clean towel.             10.  Wear clean pajamas.             11.  Place clean sheets on your bed the night of your first shower and do not  sleep with pets.  Day of Surgery : Do not apply any lotions/deodorants the morning of surgery.  Please wear clean clothes to the hospital/surgery center.  FAILURE TO FOLLOW THESE INSTRUCTIONS MAY RESULT IN THE CANCELLATION OF YOUR SURGERY    PATIENT  SIGNATURE_________________________________  ______________________________________________________________________

## 2014-04-07 ENCOUNTER — Encounter (HOSPITAL_COMMUNITY): Payer: Self-pay

## 2014-04-07 ENCOUNTER — Ambulatory Visit (HOSPITAL_COMMUNITY)
Admission: RE | Admit: 2014-04-07 | Discharge: 2014-04-07 | Disposition: A | Discharge status: Home / Self Care | Payer: Medicaid Other | Source: Ambulatory Visit | Attending: Anesthesiology | Admitting: Anesthesiology

## 2014-04-07 ENCOUNTER — Encounter (HOSPITAL_COMMUNITY)
Admission: RE | Admit: 2014-04-07 | Discharge: 2014-04-07 | Disposition: A | Discharge status: Home / Self Care | Payer: Medicaid Other | Source: Ambulatory Visit | Attending: Urology | Admitting: Urology

## 2014-04-07 DIAGNOSIS — Z720 Tobacco use: Secondary | ICD-10-CM | POA: Insufficient documentation

## 2014-04-07 DIAGNOSIS — J449 Chronic obstructive pulmonary disease, unspecified: Secondary | ICD-10-CM | POA: Diagnosis not present

## 2014-04-07 DIAGNOSIS — Z01818 Encounter for other preprocedural examination: Secondary | ICD-10-CM | POA: Diagnosis not present

## 2014-04-07 DIAGNOSIS — I10 Essential (primary) hypertension: Secondary | ICD-10-CM

## 2014-04-07 DIAGNOSIS — N200 Calculus of kidney: Secondary | ICD-10-CM | POA: Insufficient documentation

## 2014-04-07 HISTORY — DX: Calculus of kidney: N20.0

## 2014-04-07 HISTORY — DX: Sleep disorder, unspecified: G47.9

## 2014-04-07 HISTORY — DX: Personal history of other diseases of the musculoskeletal system and connective tissue: Z87.39

## 2014-04-07 HISTORY — DX: Weakness: R53.1

## 2014-04-07 HISTORY — DX: Gastro-esophageal reflux disease without esophagitis: K21.9

## 2014-04-07 LAB — BASIC METABOLIC PANEL
Anion gap: 10 (ref 5–15)
BUN: 18 mg/dL (ref 6–23)
CO2: 29 mEq/L (ref 19–32)
Calcium: 9.4 mg/dL (ref 8.4–10.5)
Chloride: 102 mEq/L (ref 96–112)
Creatinine, Ser: 1.06 mg/dL (ref 0.50–1.35)
GFR calc Af Amer: 89 mL/min — ABNORMAL LOW (ref >90)
GFR, EST NON AFRICAN AMERICAN: 77 mL/min — AB (ref >90)
Glucose, Bld: 95 mg/dL (ref 70–99)
POTASSIUM: 4.9 meq/L (ref 3.7–5.3)
SODIUM: 141 meq/L (ref 137–147)

## 2014-04-07 LAB — CBC
HEMATOCRIT: 44.4 % (ref 39.0–52.0)
Hemoglobin: 14.5 g/dL (ref 13.0–17.0)
MCH: 29.8 pg (ref 26.0–34.0)
MCHC: 32.7 g/dL (ref 30.0–36.0)
MCV: 91.2 fL (ref 78.0–100.0)
Platelets: 268 10*3/uL (ref 150–400)
RBC: 4.87 MIL/uL (ref 4.22–5.81)
RDW: 20 % — ABNORMAL HIGH (ref 11.5–15.5)
WBC: 7.6 10*3/uL (ref 4.0–10.5)

## 2014-04-07 LAB — NO BLOOD PRODUCTS

## 2014-04-07 NOTE — Progress Notes (Signed)
04/07/14 0825  OBSTRUCTIVE SLEEP APNEA  Have you ever been diagnosed with sleep apnea through a sleep study? No  Do you snore loudly (loud enough to be heard through closed doors)?  1  Do you often feel tired, fatigued, or sleepy during the daytime? 0  Has anyone observed you stop breathing during your sleep? 0  Do you have, or are you being treated for high blood pressure? 1  BMI more than 35 kg/m2? 0  Age over 55 years old? 1  Neck circumference greater than 40 cm/16 inches? 1  Gender: 1  Obstructive Sleep Apnea Score 5  Score 4 or greater  Results sent to PCP

## 2014-04-13 ENCOUNTER — Encounter (HOSPITAL_COMMUNITY)
Admission: RE | Disposition: A | Discharge status: Home / Self Care | Payer: Self-pay | Source: Ambulatory Visit | Attending: Urology

## 2014-04-13 ENCOUNTER — Encounter (HOSPITAL_COMMUNITY): Payer: Medicaid Other | Admitting: Anesthesiology

## 2014-04-13 ENCOUNTER — Encounter (HOSPITAL_COMMUNITY): Payer: Self-pay | Admitting: *Deleted

## 2014-04-13 ENCOUNTER — Ambulatory Visit (HOSPITAL_COMMUNITY): Payer: Medicaid Other | Admitting: Anesthesiology

## 2014-04-13 ENCOUNTER — Ambulatory Visit (HOSPITAL_COMMUNITY)
Admission: RE | Admit: 2014-04-13 | Discharge: 2014-04-13 | Disposition: A | Discharge status: Home / Self Care | Payer: Medicaid Other | Source: Ambulatory Visit | Attending: Urology | Admitting: Urology

## 2014-04-13 DIAGNOSIS — Z87440 Personal history of urinary (tract) infections: Secondary | ICD-10-CM | POA: Insufficient documentation

## 2014-04-13 DIAGNOSIS — I69354 Hemiplegia and hemiparesis following cerebral infarction affecting left non-dominant side: Secondary | ICD-10-CM | POA: Insufficient documentation

## 2014-04-13 DIAGNOSIS — N200 Calculus of kidney: Secondary | ICD-10-CM | POA: Diagnosis not present

## 2014-04-13 DIAGNOSIS — I10 Essential (primary) hypertension: Secondary | ICD-10-CM | POA: Diagnosis not present

## 2014-04-13 DIAGNOSIS — H5442 Blindness, left eye, normal vision right eye: Secondary | ICD-10-CM | POA: Diagnosis not present

## 2014-04-13 DIAGNOSIS — J449 Chronic obstructive pulmonary disease, unspecified: Secondary | ICD-10-CM | POA: Insufficient documentation

## 2014-04-13 DIAGNOSIS — K219 Gastro-esophageal reflux disease without esophagitis: Secondary | ICD-10-CM | POA: Diagnosis not present

## 2014-04-13 DIAGNOSIS — F17210 Nicotine dependence, cigarettes, uncomplicated: Secondary | ICD-10-CM | POA: Insufficient documentation

## 2014-04-13 DIAGNOSIS — M109 Gout, unspecified: Secondary | ICD-10-CM | POA: Diagnosis not present

## 2014-04-13 HISTORY — PX: CYSTOSCOPY WITH RETROGRADE PYELOGRAM, URETEROSCOPY AND STENT PLACEMENT: SHX5789

## 2014-04-13 HISTORY — PX: HOLMIUM LASER APPLICATION: SHX5852

## 2014-04-13 IMAGING — CT CT ABD-PELV W/ CM
1 of 3 series · 13 of 32 positions shown, 18 images · IV contrast (OMNIPAQUE 300)
Comparison: CT abdomen pelvis 07/30/2012.

CLINICAL DATA: Status post nephrolithotomy 10/07/2012 for staghorn
calculus on the right.  Status post cystoscopy 10/05/2012.
Coagulation disorder.  Bleeding from incision site.

CT ABDOMEN AND PELVIS WITH CONTRAST
TECHNIQUE: Multidetector CT imaging of the abdomen and pelvis was
performed following the standard protocol during bolus
administration of intravenous contrast.
Contrast: 100mL OMNIPAQUE IOHEXOL 300 MG/ML  SOLN

[Series 2: abd/pel with · axial · 0.88mm/px · z∈[+1140,+1484]mm · 13 of 79 slices shown, 18 images]
[im 5/79  soft-tissue]
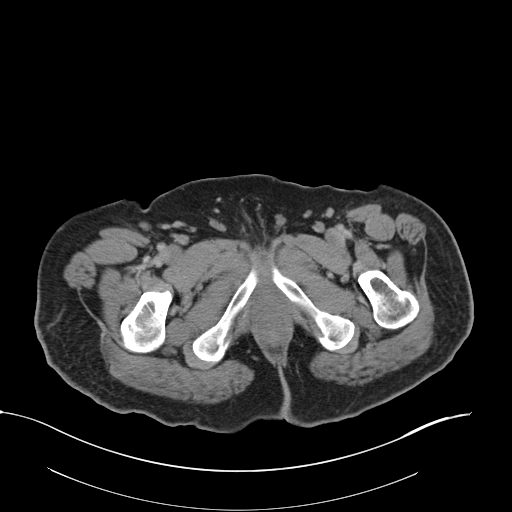
[im 5/79  bone]
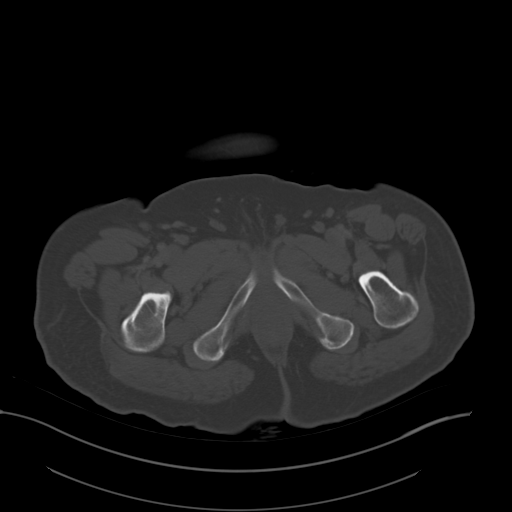
[im 14/79  soft-tissue]
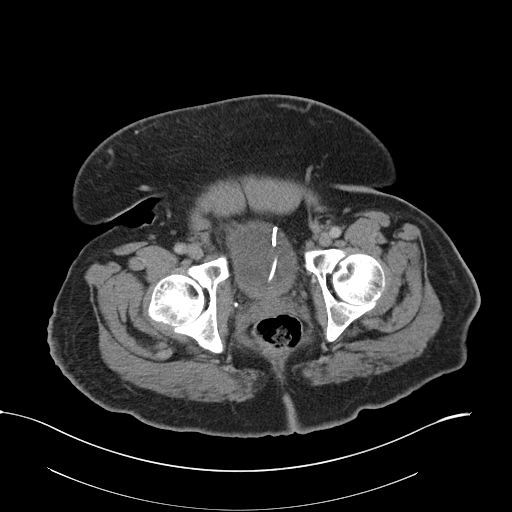
[im 18/79  soft-tissue]
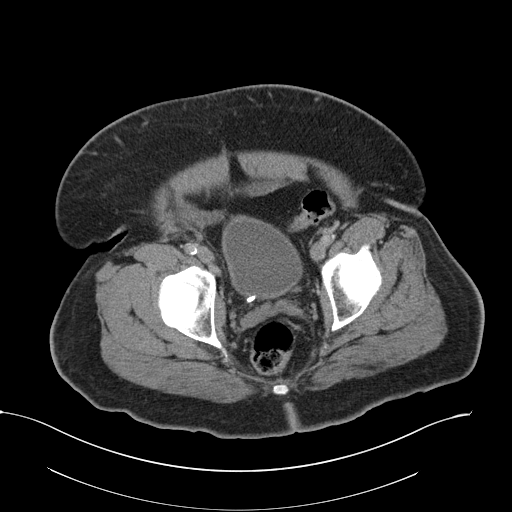
[im 22/79  soft-tissue]
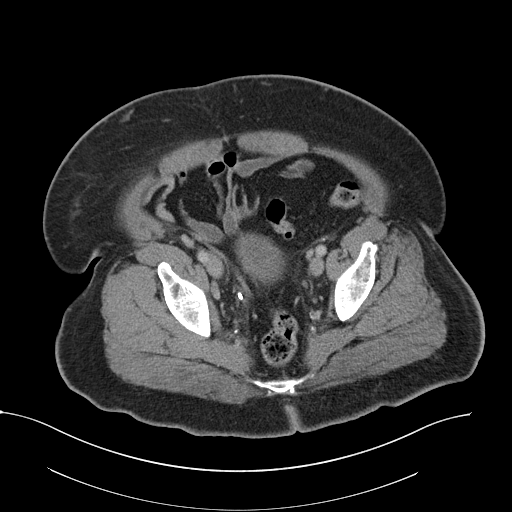
[im 31/79  soft-tissue]
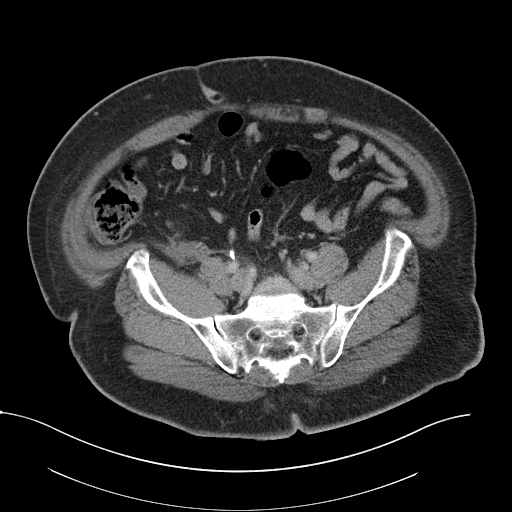
[im 35/79  soft-tissue]
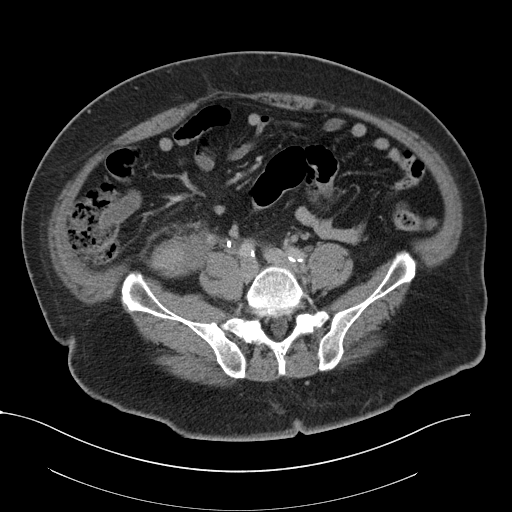
[im 44/79  soft-tissue]
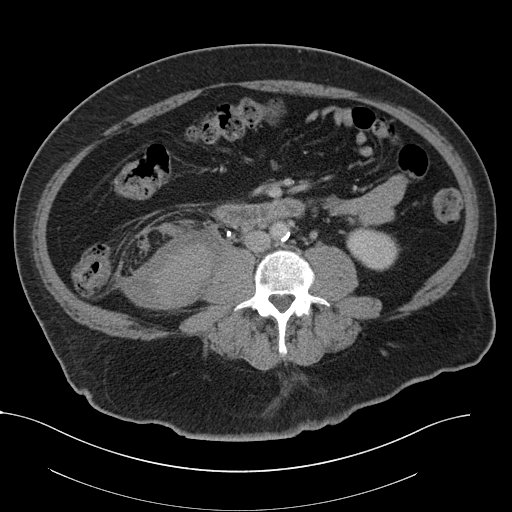
[im 48/79  soft-tissue]
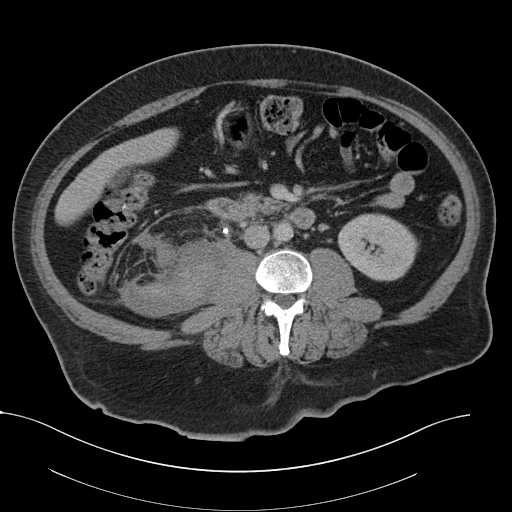
[im 57/79  soft-tissue]
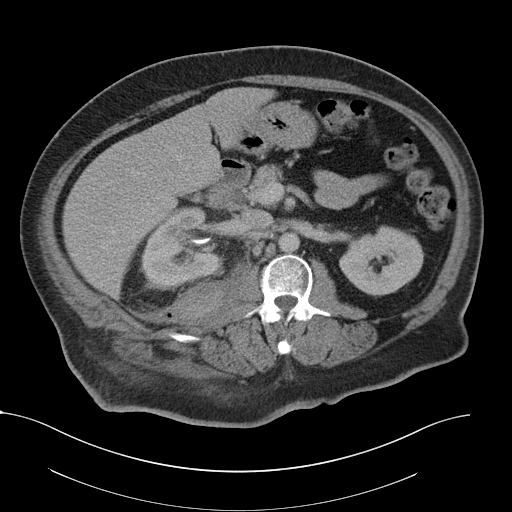
[im 57/79  bone]
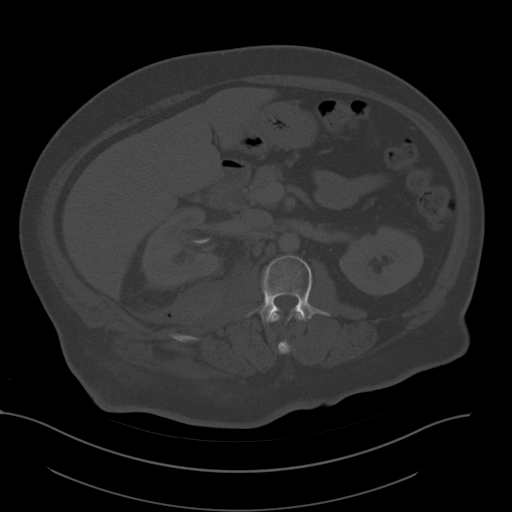
[im 61/79  soft-tissue]
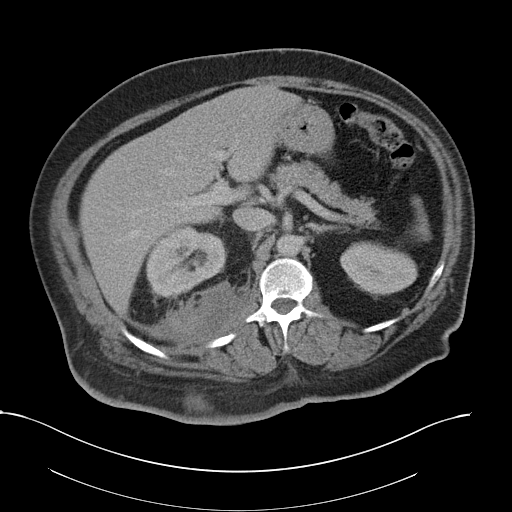
[im 61/79  lung]
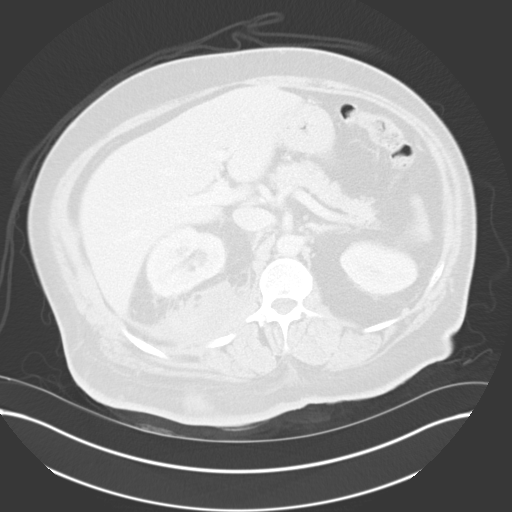
[im 66/79  soft-tissue]
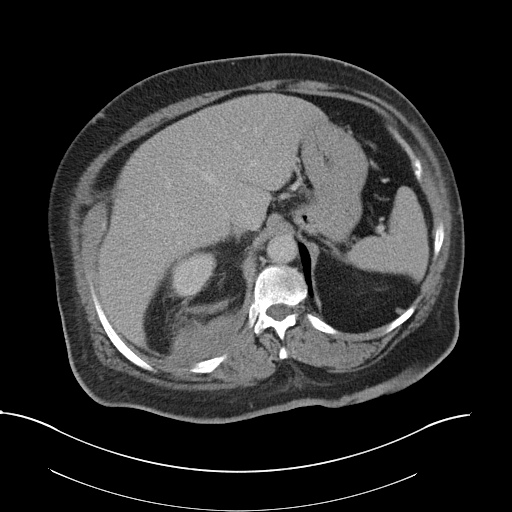
[im 66/79  lung]
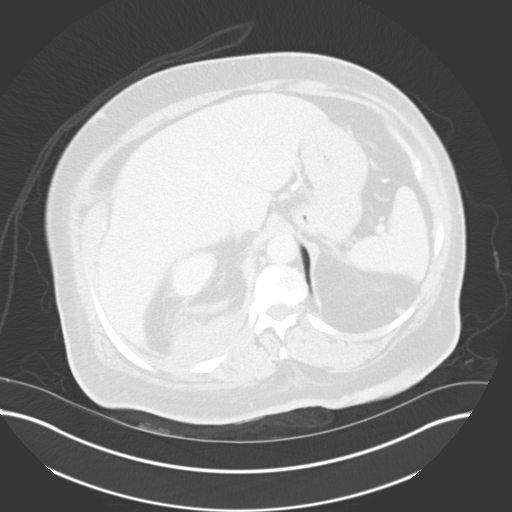
[im 70/79  lung]
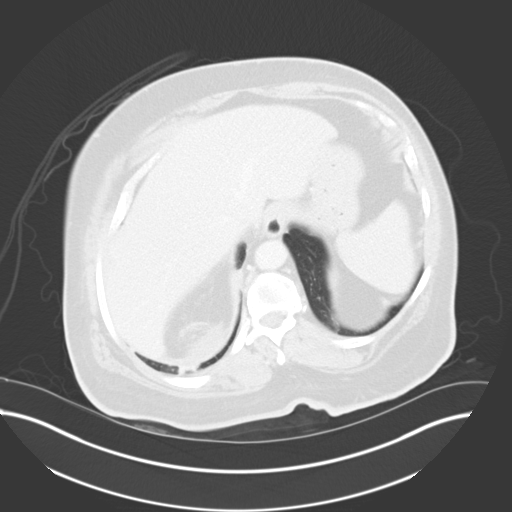
[im 74/79  soft-tissue]
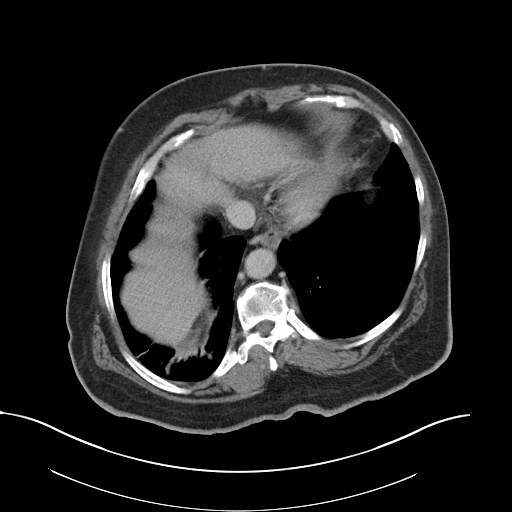
[im 74/79  lung]
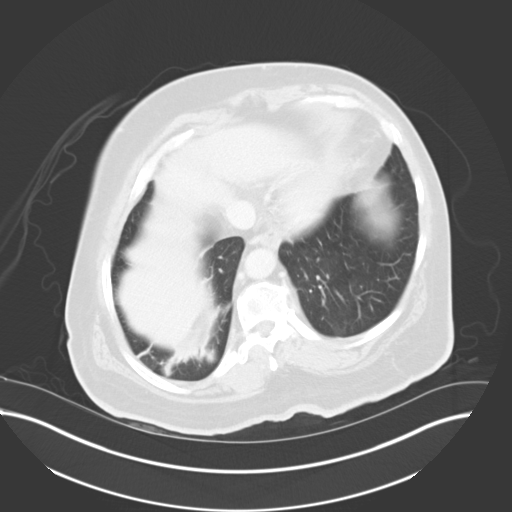

[13 of 32 positions shown; findings below may reference images not displayed]

FINDINGS: Atelectatic change is seen in the right lung base.  The
lung bases are otherwise clear.

The patient has a retroperitoneal hematoma posterior to the right
kidney.  The hematoma measures 9.6 cm transverse by 5.2 cm AP by 21
cm cranial-caudal and is of mixed attenuation.  There is no active
extravasation present.  A single locule of gas is seen within the
hematoma.  The hematoma causes mild mass effect on the right
kidney.

Since the prior study, the patient has undergone nephrolithotomy on
the right.  There are some residual stone fragments in the lower
pole of the right kidney.  These fragments are difficult to
distinguish from a right double-J ureteral stent but the largest
fragment appears to measure approximately 1.6 cm cranial-caudal.
There is no right hydronephrosis.  The left kidney is unremarkable.

The liver, gallbladder, liver, biliary tree and spleen appear
normal.  The pancreas is unremarkable.  Scattered small
retroperitoneal lymph nodes are unchanged.  The stomach, small and
large bowel and appendix appear normal.  No focal bony abnormality
is identified.
IMPRESSION: 1.  Large retroperitoneal hematoma on the right causes some mass
effect on the right kidney.  No active extravasation of contrast
material is present.  Tiny locule of gas in the superior aspect of
the collection is likely postoperative rather than indicative of
infection.
2.  Status post nephrolithotomy on the right with a double J
ureteral stent now in place.  There is no hydronephrosis but there
are some residual stone fragments in the lower pole right kidney.

## 2014-04-13 SURGERY — CYSTOURETEROSCOPY, WITH RETROGRADE PYELOGRAM AND STENT INSERTION
Anesthesia: General | Laterality: Right

## 2014-04-13 MED ORDER — HYDROMORPHONE HCL 1 MG/ML IJ SOLN: 0.2500 mg | INTRAMUSCULAR | Status: DC | PRN

## 2014-04-13 MED ORDER — SENNA-DOCUSATE SODIUM 8.6-50 MG PO TABS: 1.0000 | ORAL_TABLET | Freq: Two times a day (BID) | ORAL | Status: DC

## 2014-04-13 MED ORDER — OXYCODONE HCL 5 MG PO TABS: 5.0000 mg | ORAL_TABLET | Freq: Once | ORAL | Status: DC | PRN

## 2014-04-13 MED ORDER — IOHEXOL 300 MG/ML  SOLN
INTRAMUSCULAR | Status: DC | PRN
  Administered 2014-04-13: 50 mL via INTRAVENOUS

## 2014-04-13 MED ORDER — MIDAZOLAM HCL 2 MG/2ML IJ SOLN
INTRAMUSCULAR | Status: AC
  Filled 2014-04-13: qty 2

## 2014-04-13 MED ORDER — SODIUM CHLORIDE 0.9 % IR SOLN
Status: DC | PRN
  Administered 2014-04-13: 3000 mL

## 2014-04-13 MED ORDER — PROPOFOL 10 MG/ML IV BOLUS
INTRAVENOUS | Status: DC | PRN
  Administered 2014-04-13: 200 mg via INTRAVENOUS

## 2014-04-13 MED ORDER — FENTANYL CITRATE 0.05 MG/ML IJ SOLN
INTRAMUSCULAR | Status: AC
  Filled 2014-04-13: qty 2

## 2014-04-13 MED ORDER — LACTATED RINGERS IV SOLN: INTRAVENOUS | Status: DC

## 2014-04-13 MED ORDER — PIPERACILLIN-TAZOBACTAM 3.375 G IVPB
INTRAVENOUS | Status: AC
  Filled 2014-04-13: qty 50

## 2014-04-13 MED ORDER — MIDAZOLAM HCL 5 MG/5ML IJ SOLN
INTRAMUSCULAR | Status: DC | PRN
  Administered 2014-04-13: 1 mg via INTRAVENOUS

## 2014-04-13 MED ORDER — LACTATED RINGERS IV SOLN
INTRAVENOUS | Status: DC | PRN
  Administered 2014-04-13: 13:00:00 via INTRAVENOUS

## 2014-04-13 MED ORDER — MEPERIDINE HCL 50 MG/ML IJ SOLN: 6.2500 mg | INTRAMUSCULAR | Status: DC | PRN

## 2014-04-13 MED ORDER — PIPERACILLIN-TAZOBACTAM 3.375 G IVPB 30 MIN
3.3750 g | Freq: Once | INTRAVENOUS | Status: AC
  Administered 2014-04-13: 3.375 g via INTRAVENOUS

## 2014-04-13 MED ORDER — PROPOFOL 10 MG/ML IV BOLUS
INTRAVENOUS | Status: AC
  Filled 2014-04-13: qty 20

## 2014-04-13 MED ORDER — OXYCODONE-ACETAMINOPHEN 5-325 MG PO TABS: 1.0000 | ORAL_TABLET | ORAL | Status: DC | PRN

## 2014-04-13 MED ORDER — ONDANSETRON HCL 4 MG/2ML IJ SOLN
INTRAMUSCULAR | Status: AC
  Filled 2014-04-13: qty 2

## 2014-04-13 MED ORDER — ALBUTEROL SULFATE HFA 108 (90 BASE) MCG/ACT IN AERS
INHALATION_SPRAY | RESPIRATORY_TRACT | Status: DC | PRN
  Administered 2014-04-13: 5 via RESPIRATORY_TRACT

## 2014-04-13 MED ORDER — ALBUTEROL SULFATE HFA 108 (90 BASE) MCG/ACT IN AERS
INHALATION_SPRAY | RESPIRATORY_TRACT | Status: AC
  Filled 2014-04-13: qty 6.7

## 2014-04-13 MED ORDER — PROMETHAZINE HCL 25 MG/ML IJ SOLN: 6.2500 mg | INTRAMUSCULAR | Status: DC | PRN

## 2014-04-13 MED ORDER — FENTANYL CITRATE 0.05 MG/ML IJ SOLN
INTRAMUSCULAR | Status: DC | PRN
  Administered 2014-04-13 (×3): 50 ug via INTRAVENOUS

## 2014-04-13 MED ORDER — OXYCODONE HCL 5 MG/5ML PO SOLN: 5.0000 mg | Freq: Once | ORAL | Status: DC | PRN

## 2014-04-13 SURGICAL SUPPLY — 26 items
BAG URINE DRAINAGE (UROLOGICAL SUPPLIES) ×3 IMPLANT
BASKET LASER NITINOL 1.9FR (BASKET) IMPLANT
BASKET STNLS GEMINI 4WIRE 3FR (BASKET) IMPLANT
BASKET ZERO TIP NITINOL 2.4FR (BASKET) IMPLANT
BSKT STON RTRVL 120 1.9FR (BASKET)
BSKT STON RTRVL GEM 120X11 3FR (BASKET)
BSKT STON RTRVL ZERO TP 2.4FR (BASKET)
CATH INTERMIT  6FR 70CM (CATHETERS) ×3 IMPLANT
CLOTH BEACON ORANGE TIMEOUT ST (SAFETY) ×3 IMPLANT
DRAPE CAMERA CLOSED 9X96 (DRAPES) ×3 IMPLANT
DRAPE LEGGING IMPERMEABLE (DRAPES) ×2 IMPLANT
ELECT REM PT RETURN 9FT ADLT (ELECTROSURGICAL)
ELECTRODE REM PT RTRN 9FT ADLT (ELECTROSURGICAL) IMPLANT
FIBER LASER FLEXIVA 200 (UROLOGICAL SUPPLIES) IMPLANT
FIBER LASER FLEXIVA 365 (UROLOGICAL SUPPLIES) IMPLANT
FIBER LASER TRAC TIP (UROLOGICAL SUPPLIES) ×2 IMPLANT
GLOVE BIOGEL M STRL SZ7.5 (GLOVE) ×3 IMPLANT
GOWN STRL REUS W/TWL LRG LVL3 (GOWN DISPOSABLE) ×3 IMPLANT
GUIDEWIRE ANG ZIPWIRE 038X150 (WIRE) ×3 IMPLANT
GUIDEWIRE STR DUAL SENSOR (WIRE) ×3 IMPLANT
IV NS IRRIG 3000ML ARTHROMATIC (IV SOLUTION) ×3 IMPLANT
PACK CYSTO (CUSTOM PROCEDURE TRAY) ×3 IMPLANT
STENT CONTOUR 6FRX26X.038 (STENTS) ×2 IMPLANT
SYRINGE 10CC LL (SYRINGE) ×2 IMPLANT
SYRINGE IRR TOOMEY STRL 70CC (SYRINGE) IMPLANT
TUBE FEEDING 8FR 16IN STR KANG (MISCELLANEOUS) ×3 IMPLANT

## 2014-04-13 NOTE — Anesthesia Postprocedure Evaluation (Signed)
Anesthesia Post Note  Patient: [name]  Procedure(s) Performed: Procedure(s) (LRB): CYSTOSCOPY WITH RETROGRADE PYELOGRAM, URETEROSCOPY AND STENT PLACEMENT, LASER ABLATION OF SCAR TISSUE (Right) HOLMIUM LASER APPLICATION (Right)  Anesthesia type: General  Patient location: PACU  Post pain: Pain level controlled  Post assessment: Post-op Vital signs reviewed  Last Vitals: BP 137/88  Pulse 94  Temp(Src) 36.4 C (Oral)  Resp 16  Ht 5' 5.5" (1.664 m)  Wt 212 lb (96.163 kg)  BMI 34.73 kg/m2  SpO2 97%  Post vital signs: Reviewed  Level of consciousness: sedated  Complications: No apparent anesthesia complications

## 2014-04-13 NOTE — Anesthesia Preprocedure Evaluation (Signed)
Anesthesia Evaluation  Patient identified by MRN, date of birth, ID band Patient awake    Reviewed: Allergy & Precautions, H&P , NPO status , Patient's Chart, lab work & pertinent test results  Airway Mallampati: III TM Distance: <3 FB Neck ROM: Full    Dental no notable dental hx. (+) Teeth Intact, Poor Dentition   Pulmonary COPD COPD inhaler, Current Smoker,  breath sounds clear to auscultation  Pulmonary exam normal       Cardiovascular hypertension, Pt. on medications Rhythm:Regular Rate:Normal     Neuro/Psych CVA, Residual Symptoms negative psych ROS   GI/Hepatic Neg liver ROS, GERD-  ,  Endo/Other  Morbid obesity  Renal/GU Renal disease  negative genitourinary   Musculoskeletal  (+) Arthritis -,   Abdominal   Peds  Hematology negative hematology ROS (+)   Anesthesia Other Findings   Reproductive/Obstetrics                           Anesthesia Physical  Anesthesia Plan  ASA: III  Anesthesia Plan: General   Post-op Pain Management:    Induction: Intravenous  Airway Management Planned: LMA  Additional Equipment:   Intra-op Plan:   Post-operative Plan: Extubation in OR  Informed Consent: I have reviewed the patients History and Physical, chart, labs and discussed the procedure including the risks, benefits and alternatives for the proposed anesthesia with the patient or authorized representative who has indicated his/her understanding and acceptance.   Dental advisory given  Plan Discussed with: CRNA  Anesthesia Plan Comments:         Anesthesia Quick Evaluation

## 2014-04-13 NOTE — Transfer of Care (Signed)
Immediate Anesthesia Transfer of Care Note  Patient: [name]  Procedure(s) Performed: Procedure(s): CYSTOSCOPY WITH RETROGRADE PYELOGRAM, URETEROSCOPY AND STENT PLACEMENT, LASER ABLATION OF SCAR TISSUE (Right) HOLMIUM LASER APPLICATION (Right)  Patient Location: PACU  Anesthesia Type:General  Level of Consciousness: awake, sedated and patient cooperative  Airway & Oxygen Therapy: Patient Spontanous Breathing and Patient connected to face mask oxygen  Post-op Assessment: Report given to PACU RN and Post -op Vital signs reviewed and stable  Post vital signs: Reviewed and stable  Complications: No apparent anesthesia complications

## 2014-04-13 NOTE — Discharge Instructions (Signed)
1 - You may have urinary urgency (bladder spasms) and bloody urine on / off with stent in place. This is normal. ° °2 - Call MD or go to ER for fever >102, severe pain / nausea / vomiting not relieved by medications, or acute change in medical status ° °

## 2014-04-13 NOTE — H&P (Signed)
[name] is an 53 y.o. male.    Chief Complaint: Pre-Op right Ureteroscopic Stone Manipulation  HPI:    1 - Nephrolithiasis - s/p Rt 2-stage PCNL 2014 for 4cm volume of Rt partial staghorn stone found on w/u of recurrent UTI. No prior stone episodes. Composition CaPO4. BMP normal. F/U CT post-op with some residual lowerpole stone that was not acessible due to extremely narrow infundibula. .  Recent Course: 05/2013 - stable Rt lower pole + mid sotne total voluem about 1cm, no hydro 03/2014 - CT Rt lower pole + mid stone approx stable.   2 - Recurrent UTI - pt with multiple recent UTI's. Partial staghorn stone as per above. 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 PVR 08/2012 "35mL".  Most Recent UCX's with e. coli and strep sens to cephalosporins (Keflex, Rocephin), but variably sensitive to others.  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 Most recent UCX e. Coli and he has been on augmentin peri-op to reduce colonization. No recent fevers.     Past Medical History  Diagnosis Date  . COPD (chronic obstructive pulmonary disease)   . Hypertension   . Stroke sept 26,  20006     hx mini strokes in past, 1 large stroke  . Arthritis   . High cholesterol   . 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 Weakness of one side of body     L sided weakness after stroke  . History of gout   . GERD (gastroesophageal reflux disease)   . Recurrent kidney stones   . Difficulty sleeping     Past Surgical History  Procedure Laterality Date  . Left ey surgery  age  59  . Nephrolithotomy Right 10/05/2012    Procedure: NEPHROLITHOTOMY PERCUTANEOUS;  Surgeon: [name], MD;  Location: [facility];  Service: Urology;  Laterality: Right;  WITH CYSTO AND SURGEON ACCESS   . Cystoscopy N/A 10/05/2012    Procedure: CYSTOSCOPY, retrograde and right stent placement;  Surgeon: [name], MD;  Location: [facility];  Service: Urology;  Laterality: N/A;  . Nephrolithotomy Right 10/07/2012    Procedure: NEPHROLITHOTOMY PERCUTANEOUS SECOND LOOK. Stent exchange,Ureteroscopy ;  Surgeon: [name], MD;  Location: [facility];  Service: Urology;  Laterality: Right;  . Toenail excision      No family history on file. Social History:  reports that he has been smoking Cigarettes.  He has a 7.5 pack-year smoking history. He has never used smokeless tobacco. He reports that he does not drink alcohol or use illicit drugs.  Allergies: No Known Allergies  No prescriptions prior to admission    No results found for this or any previous visit (from the past 48 hour(s)). No results found.  Review of Systems  Constitutional: Negative.  Negative for fever and chills.  HENT: Negative.   Eyes:       Blind in one eye  Respiratory: Negative.   Cardiovascular: Negative.   Gastrointestinal: Negative.  Negative for nausea and vomiting.  Genitourinary: Negative.  Negative for flank pain.  Musculoskeletal: Negative.   Skin: Negative.   Neurological: Negative.   Endo/Heme/Allergies: Negative.   Psychiatric/Behavioral: Negative.     There were no vitals taken for this visit. Physical Exam  Constitutional: He appears well-developed.  HENT:  Head: Normocephalic.  Eyes: Right eye exhibits no discharge. Left eye exhibits no discharge.  Neck: Thyromegaly present.  Cardiovascular: Normal rate.   Respiratory: Effort normal.  GI: Soft.  Genitourinary:  No CVAT  Musculoskeletal: Normal range of motion.  Uses cane  Neurological: He is alert.  Skin: Skin is warm and dry.  Psychiatric:  He has a normal mood and affect. His behavior is normal. Judgment and thought content normal.     Assessment/Plan   1 - Nephrolithiasis -  We rediscussed ureteroscopic stone manipulation with basketing and laser-lithotripsy in detail.  We discussed risks including bleeding, infection, damage to kidney / ureter  bladder, rarely loss of kidney. We rediscussed anesthetic risks and rare but serious surgical complications including DVT, PE, MI, and mortality. We specifically readdressed that in 5-10% of cases a staged approach is required with stenting followed by re-attempt ureteroscopy if anatomy unfavorable. The patient voiced understanding and wises to proceed today as planned.   2 - Recurrent UTI - INitially improved after stone surgery, but now recurrent again. Recent PVR  unremarkable. Suspect increased burden colonized stone as nidus.    3 -Metabolic Stone Disease / Oliguria / Hypocitraturia - Continue current regimen of increased hydration and K-Cit.    [name] 04/13/2014, 7:10 AM

## 2014-04-13 NOTE — Brief Op Note (Signed)
04/13/2014  2:55 PM  PATIENT:  [name]  53 y.o. male  PRE-OPERATIVE DIAGNOSIS:  Right Renal Stones, ReCurrent Urinary Tract Infections  POST-OPERATIVE DIAGNOSIS:  right renal stones, urinary tract infections, Rt Lower pole infundibular stenosis (severe)  PROCEDURE:  Procedure(s): CYSTOSCOPY WITH RETROGRADE PYELOGRAM, URETEROSCOPY AND STENT PLACEMENT (Right) HOLMIUM LASER APPLICATION (Right) of intrarenal stricture  SURGEON:  Surgeon(s) and Role:    * [name], MD - Primary  PHYSICIAN ASSISTANT:   ASSISTANTS: none   ANESTHESIA:   general  EBL:     BLOOD ADMINISTERED:none  DRAINS: none   LOCAL MEDICATIONS USED:  NONE  SPECIMEN:  No Specimen  DISPOSITION OF SPECIMEN:  N/A  COUNTS:  YES  TOURNIQUET:  * No tourniquets in log *  DICTATION: .Other Dictation: Dictation Number [id number]  PLAN OF CARE: Discharge to home after PACU  PATIENT DISPOSITION:  PACU - hemodynamically stable.   Delay start of Pharmacological VTE agent (>24hrs) due to surgical blood loss or risk of bleeding: not applicable

## 2014-04-14 NOTE — Op Note (Signed)
[name], [name]               ACCOUNT NO.:  [id number]  MEDICAL RECORD NO.:  [id number]  LOCATION:  [facility]                         FACILITY:  [facility]  PHYSICIAN:  [name], MD     DATE OF BIRTH:  10-21-1960  DATE OF PROCEDURE: 04/13/2014 DATE OF DISCHARGE:  04/13/2014                              OPERATIVE REPORT   PREOPERATIVE DIAGNOSES:  Recurrent urinary tract infections.  Right lower pole kidney renal stone.  POSTOPERATIVE DIAGNOSES:  Recurrent urinary tract infections.  Right lower pole kidney renal stone.  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  PROCEDURES: 1. Cystoscopy with right retrograde pyelogram and interpretation. 2. Right ureteroscopy with laser of infundibular stricture. 3. Placement of right ureteral stent, 6 x 26, no tether.  FINDINGS: 1. Nonfilling of right lower pole on retrograde pyelogram. 2. Severe right lower infundibular stenosis. 3. Inability to communicate to lower pole with lasering of     infundibular stricture. 4. Excellent placement of right ureteral stent proximal coil upper     pole, distal coil of the urinary bladder.  INDICATION:  [name] is a 53 year old gentleman with history of recurrent nephrolithiasis as well as recurrent urinary tract infection, which have been febrile and required hospitalizations.  He had undergone previous right stone procedure with percutaneous nephrostolithotomy in the past and is well for approximately a year.  He, however, has been in a cycle of recurrent tract infections, which has been severe, requiring hospital admission.  Axial imaging revealed progressive lower pole right renal stones and until I given the volume of these, this would be best addressed with ureteroscopic approach with goal of removing potential nidus of infection, hemostasis of bladder as well.  Informed consent was obtained and placed in the medical record.  PROCEDURE IN DETAIL:  The patient being, [name], was verified. Procedure being right ureteroscopic stone manipulation was confirmed. Procedure was carried out.  Time-out was performed.  Intravenous antibiotics were administered.  General LMA anesthesia was introduced. The patient was placed into a low lithotomy position and sterile field was created by prepping and draping the patient's penis, perineum, and proximal thighs using iodine x3.  Next, cystourethroscopy was performed using a 22-French rigid cystoscope with 12-degree offset lens. Inspection of the anterior and posterior urethra were unremarkable. Inspection of the urinary bladder revealed no diverticula, calcifications, or papular lesions.  The right ureteral orifice was cannulated with a 6-French end-hole catheter and right retrograde pyelogram was obtained.  Right retrograde pyelogram was a single right ureter with single-system right kidney.  There was nonfilling of the lower pole.  There was some radiodensities in lower pole consistent with known stone.  A 0.038 Zip wire was advanced at the level of the upper pole and set aside as a safety wire.  Next, semi-rigid ureteroscopy was performed at entire length of the right ureter alongside a separate Sensor working wire.  No mucosal abnormalities were found.  The semi-rigid ureteroscope was exchanged for the 12/14 38-cm ureteral access sheath at the level of the proximal ureter.  Next, flexible digital ureteroscopy was performed using 8-French digital ureteroscope, which allowed inspection of the proximal ureter and systematic inspection of the right  kidney. Inspection of the upper and mid poles revealed no evidence of urolithiasis whatsoever.  The lower pole of the kidney was unable to be visualized and the area of lower pole infundibulum was completely replaced by dense, white scar consistent with likely infundibular stenosis.  The tip of the ureteroscope appeared to come approximately 1.5-2 cm away from the area of  lower pole density.  Multiple angulations of wires were used to try to cannulate the true lumen to the area of the lower pole and at one point, a Sensor wire was able to be advanced, was felt to be the lower pole calyx.  An attempt was made to place a ureteral balloon dilation apparatus across this; however, due to angulation of the lower pole, this was not possible.  As such, holmium laser energy was applied directly onto the area of scar in effort to incise and again access to the lower pole through this infundibular stenosis and a 200 nanometer laser fiber was used, settings of 1 joule and 5 hertz.  Again, the setting was chosen for optimal coagulation and very careful systematic incision of the presumed infundibular stenosis scar was performed directly into the orientation radiographically towards the stone.  This allowed incision of the scar for distance approximately 1.5 cm.  However the area of stone was not encountered, was felt to be proper incision trajectory.  Given these findings, infundibular stenosis appeared to be quite severe that a simple retrograde ureteroscopic approach would not be sufficient to gain access to the stone or dilate the stricture.  So, the procedure today should be abandoned with stenting only and discussed to the patient further options going forward such as observation alone versus attempted percutaneous procedure versus right lower pole partial nephrectomy.  At this point, hemostasis appeared excellent.  Final retrograde pyelogram revealed no extravasation of contrast.  The ureteroscope and access sheath were removed under continuous ureteroscopic vision and no mucosal abnormalities were found.  Finally, a new 6 x 26, double-J stent was placed into an upper pole calyx.  Good proximal and distal curl were noted.  Bladder was emptied per cystoscope.  Procedure was then terminated.  The patient tolerated the procedure well.  There were no immediate  periprocedural complications.  The patient was taken to the postanesthesia care unit in stable condition.          ______________________________ [name], MD     TM/MEDQ  D:  04/13/2014  T:  04/14/2014  Job:  [id number]

## 2014-04-15 ENCOUNTER — Encounter (HOSPITAL_COMMUNITY): Payer: Self-pay | Admitting: Urology

## 2015-07-31 ENCOUNTER — Ambulatory Visit: Payer: Medicaid Other | Attending: Internal Medicine | Admitting: Physical Therapy

## 2015-07-31 DIAGNOSIS — R258 Other abnormal involuntary movements: Secondary | ICD-10-CM | POA: Insufficient documentation

## 2015-07-31 DIAGNOSIS — R2681 Unsteadiness on feet: Secondary | ICD-10-CM | POA: Insufficient documentation

## 2015-07-31 DIAGNOSIS — R269 Unspecified abnormalities of gait and mobility: Secondary | ICD-10-CM | POA: Insufficient documentation

## 2015-07-31 DIAGNOSIS — R252 Cramp and spasm: Secondary | ICD-10-CM

## 2015-07-31 DIAGNOSIS — G8110 Spastic hemiplegia affecting unspecified side: Secondary | ICD-10-CM | POA: Insufficient documentation

## 2015-07-31 NOTE — Therapy (Signed)
[facility] [address] [place], [place], [address] Phone: [phone]   Fax:  [phone]  Physical Therapy Evaluation  Patient Details  Name: [name] MRN: [id number] Date of Birth: 26-Feb-1961 Referring Provider: [name], MD  Encounter Date: 07/31/2015      PT End of Session - 07/31/15 1059    Visit Number 1   Number of Visits 4  eval + 3 visits   Date for PT Re-Evaluation 09/29/15   Authorization Type Medicaid   PT Start Time 0801   PT Stop Time 0848   PT Time Calculation (min) 47 min   Activity Tolerance Patient tolerated treatment well   Behavior During Therapy [facility] for tasks assessed/performed      Past Medical History  Diagnosis Date  . COPD (chronic obstructive pulmonary disease)   . Hypertension   . Stroke sept 26,  20006     hx mini strokes in past, 1 large stroke  . Arthritis   . High cholesterol   . 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 Weakness of one side of body     L sided weakness after stroke  . History of gout   . GERD (gastroesophageal reflux disease)   . Recurrent kidney stones   . Difficulty sleeping     Past Surgical History  Procedure Laterality Date  . Left ey surgery  age 53  . Nephrolithotomy Right 10/05/2012    Procedure: NEPHROLITHOTOMY PERCUTANEOUS;  Surgeon: [name], MD;  Location: [facility];  Service: Urology;  Laterality: Right;  WITH CYSTO AND SURGEON ACCESS   . Cystoscopy N/A 10/05/2012    Procedure: CYSTOSCOPY, retrograde and right stent placement;  Surgeon: [name], MD;  Location: [facility];  Service: Urology;  Laterality: N/A;  . Nephrolithotomy Right 10/07/2012    Procedure: NEPHROLITHOTOMY PERCUTANEOUS SECOND LOOK. Stent exchange,Ureteroscopy ;  Surgeon: [name], MD;  Location: [facility];  Service: Urology;  Laterality: Right;  . Toenail excision    . Cystoscopy with retrograde pyelogram, ureteroscopy  and stent placement Right 04/13/2014    Procedure: CYSTOSCOPY WITH RETROGRADE PYELOGRAM, URETEROSCOPY AND STENT PLACEMENT, LASER ABLATION OF SCAR TISSUE;  Surgeon: [name], MD;  Location: [facility];  Service: Urology;  Laterality: Right;  . Holmium laser application Right [id number]    Procedure: HOLMIUM LASER APPLICATION;  Surgeon: [name], MD;  Location: [facility];  Service: Urology;  Laterality: Right;    There were no vitals filed for this visit.  Visit Diagnosis:  Abnormality of gait - Plan: PT plan of care cert/re-cert  Spastic hemiplegia affecting nondominant side ([place]) - Plan: PT plan of care cert/re-cert  Unsteadiness - Plan: PT plan of care cert/re-cert  Spasticity - Plan: PT plan of care cert/re-cert      Subjective Assessment - 07/31/15 0810    Subjective "My doctor says my right side is giving out because I don't want to put any weight on my left side." Pt has had many "close calls" but no falls in the past 6 months.  Since CVA on 03/27/2015, pt has been using a cane for household and some community mobility. Pt has mobility scooter, which he uses to go to nearby grocery store; however, [facility] does not accomodate scooter, so pt uses [facility] for most medical appts.    Pertinent History CVA (03/26/2005); blind in L eye and low vision in R eye; HLD, HTN, gout, OA,  L sciatica   Patient Stated Goals "Try to figure out a way to take some stress of this right side and see if there's a way to make the left side work halfway decent."   Currently in Pain? Yes   Pain Score 5    Pain Location Back   Pain Orientation Left;Lower   Pain Descriptors / Indicators Sharp;Shooting   Pain Type Chronic pain   Pain Radiating Towards --  not at this time; at times, radiates into center of buttocks   Pain Onset Other (comment)  past 6 months   Pain Frequency Intermittent   Aggravating Factors  sitting in low chair   Pain Relieving Factors lying down; use of electric blanket; Advil; rest    Effect of Pain on Daily Activities avoids sitting in low chairs; limits activity in general   Multiple Pain Sites No            [facility] PT Assessment - 07/31/15 0001    Assessment   Medical Diagnosis CVA   Referring Provider [name], MD   Onset Date/Surgical Date 03/26/05   Hand Dominance Right   Precautions   Precautions Fall   Restrictions   Weight Bearing Restrictions No   Balance Screen   Has the patient fallen in the past 6 months No   Has the patient had a decrease in activity level because of a fear of falling?  Yes   Is the patient reluctant to leave their home because of a fear of falling?  Yes   [place] residence   Additional Comments has trouble walking dogs   Prior Function   Level of Independence Independent   Vocation Full time employment;On disability  FT prior to CVA (2006); disability since 2007   Vocation Requirements hauled junk cars   Leisure loves his two pitbulls, Teacher, English as a foreign language Impaired Detail   Light Touch Impaired Details Impaired LUE;Impaired LLE   Hot/Cold Impaired Detail   Hot/Cold Impaired Details Impaired LUE;Impaired LLE   Additional Comments LT diminished on LUE/LLE   Coordination   Gross Motor Movements are Fluid and Coordinated No   Heel Shin Test Unable to perform due to impaired tone, decreased selective control in LLE; RLE WNL.   Posture/Postural Control   Posture Comments Posterior trunk lean (in both seated and standing) to initiate L hip flexion, knee extension.   Tone   Assessment Location Left Lower Extremity   ROM / Strength   AROM / PROM / Strength AROM;Strength   AROM   Overall AROM  Deficits   Overall AROM Comments L knee extension limited (to approx. -15 degrees) by impaired strength, selective control.   Strength   Overall Strength Within functional limits for tasks performed   Overall Strength Comments RLE grossly WFL.   Strength Assessment Site  Hip;Knee;Ankle   Right/Left Hip Right;Left   Left Hip Flexion 2+/5   Right/Left Knee Left   Left Knee Flexion 1/5   Left Knee Extension 3-/5   Right/Left Ankle Right;Left   Left Ankle Dorsiflexion 0/5   Left Ankle Plantar Flexion 0/5   Left Ankle Inversion 0/5   Left Ankle Eversion 0/5   Ambulation/Gait   Ambulation Surface Level;Indoor   Standardized Balance Assessment   Standardized Balance Assessment Timed Up and Go Test   Timed Up and Go Test   TUG Normal TUG   Normal TUG (seconds) 38.34  using standard cane  LLE Tone   LLE Tone Modified Ashworth   LLE Tone   Modified Ashworth Scale for Grading Hypertonia LLE Considerable increase in muschle tone, passive movement difficult  MAS 3 in L ankle PF; 1+ in L hamstrings                   [facility] Adult PT Treatment/Exercise - 07/31/15 0001    Transfers   Transfers Sit to Stand;Stand to Sit   Sit to Stand 5: Supervision   Sit to Stand Details (indicate cue type and reason) requires increased time; majority of WB on RLE.   Stand to Sit 5: Supervision   Stand to Sit Details majority of WB on RLE.   Ambulation/Gait   Ambulation/Gait Yes   Ambulation/Gait Assistance 5: Supervision   Ambulation Distance (Feet) 130 Feet   Assistive device Straight cane   Gait Pattern Step-through pattern;Decreased arm swing - right;Decreased step length - right;Decreased weight shift to left;Decreased hip/knee flexion - left;Decreased stance time - left;Decreased dorsiflexion - left;Poor foot clearance - left;Wide base of support;Left circumduction  trunk extension to initiate LLE advancement   Gait velocity 1.21 ft/sec  < 1.8 ft/sec indicates recurrent fall risk                PT Education - 07/31/15 1320    Education provided Yes   Education Details PT eval findings, goals, and POC. Insurance limitations.   Person(s) Educated Patient   Methods Explanation   Comprehension Verbalized understanding          PT Short Term  Goals - 07/31/15 1108    PT SHORT TERM GOAL #1   Title Pt will perform initial home exercise program with mod I using paper handout to indicate safe HEP compliance.  (Target date: 08/28/15)   Baseline Pt completedly dependent for HEP   PT SHORT TERM GOAL #2   Title Pt will improve TUG from 38.34 seconds to < / = 35 seconds to indicate increased efficiency of mobility.  (Target date: 08/28/15)   Baseline 38.34 seconds with SPC   PT SHORT TERM GOAL #3   Title Pt will improve gait velocity from 1.21 ft/sec to > / = 1.51 ft/sec to indicate increased efficiency of ambulation.  (Target date: 08/28/15)   Baseline 1.21 ft/sec with SPC   PT SHORT TERM GOAL #4   Title Pt will demo consistent sit <> stand from standard chair with mod I, single UE use, and symmetrical WB to indicate increased independence and to manage LUE tone.   (Target date: 08/28/15)   Baseline Sit <> stand with supervision and majority of weightbearing on RLE causing increased hypertonia in LUE.           PT Long Term Goals - 07/31/15 1118    PT LONG TERM GOAL #1   Title Pt will independently perform HEP to maximize functional gains made in PT.    (Target date: 09/25/15)   Baseline Pt completely dependent for HEP   PT LONG TERM GOAL #2   Title Pt will improve TUG from 38.34 seconds to < / =  33 seconds to indicate increased efficiency of mobility.   (Target date: 09/25/15)   Baseline 38.34 with SPC   PT LONG TERM GOAL #3   Title Pt will improve gait velocity from 1.21 ft/sec to > / = 1.81 ft/sec to indicate decreased risk of recurrent falls.  (Target date: 09/25/15)   Baseline 1.21 ft/sec with SPC   PT LONG TERM  GOAL #4   Title Pt will don/doff AFO with mod I to enable pt to wear AFO to normalize gait pattern, manage LUE/LLE tone, and increase efficiency of ambulation.  (Target date: 09/25/15)   Baseline Pt does not wear L AFO due to inability to don/doff without assistance.   PT LONG TERM GOAL #5   Title Pt will perform floor  transfer with supervision to enable pt to safely recover from fall.  (Target date: 09/25/15)   Baseline Pt reports fear of falling due to inability to get up off of floor in the case of a fall.               Plan - 07/31/15 1042    Clinical Impression Statement Pt is a 55 y/o M referred to outpatient PT to address functional impairments associated with CVA (03/26/2005) with spastic hemiplegia affecting nondominant left side (G81.14). PT evaluation reveals the following impairments:  gait impairments; L hemplegia; L foot drop; L sensory impairments; difficulty standing from standard chair; difficulty donning/doffing L AFO; postural impairments; gait speed suggestive of recurrent fall risk. Pt will benefit from skilled outpatient PT for 3 total sessions over the course of 8 weeks to address said impairments.    Pt will benefit from skilled therapeutic intervention in order to improve on the following deficits Abnormal gait;Decreased activity tolerance;Decreased balance;Postural dysfunction;Pain;Decreased endurance;Decreased coordination;Impaired sensation;Decreased strength;Impaired tone   Rehab Potential Good   Clinical Impairments Affecting Rehab Potential Insurance coverage limited to 3 visits total   PT Frequency Other (comment)  3 total visits   PT Duration 8 weeks  over the course of 8 weeks   PT Treatment/Interventions Gait training;Stair training;Functional mobility training;Therapeutic activities;Therapeutic exercise;Balance training;Orthotic Fit/Training;Patient/family education;Neuromuscular re-education;DME Instruction;Vestibular;ADLs/Self Care Home Management   PT Next Visit Plan Initiate HEP with focus on managing LLE tone, L hip flexor strengthening, and increasing selective control in LLE.  Attempt to assist pt with donning/doffing AFO. Consider scheduling appt with orthotist to modify AFO, if indicated.    Consulted and Agree with Plan of Care Patient         Problem  List There are no active problems to display for this patient.   Billie Ruddy, PT, DPT [facility] [address] [place], [place], [address] Phone: [phone]   Fax:  [phone] 07/31/2015, 1:26 PM   Name: [name] MRN: [id number] Date of Birth: 09/13/1960

## 2015-08-03 ENCOUNTER — Encounter: Payer: Medicaid Other | Admitting: Occupational Therapy

## 2015-08-03 ENCOUNTER — Other Ambulatory Visit (HOSPITAL_COMMUNITY): Payer: Self-pay | Admitting: Gastroenterology

## 2015-08-03 ENCOUNTER — Ambulatory Visit: Payer: Medicaid Other | Admitting: Rehabilitative and Restorative Service Providers"

## 2015-08-03 DIAGNOSIS — R1310 Dysphagia, unspecified: Secondary | ICD-10-CM

## 2015-08-03 DIAGNOSIS — R059 Cough, unspecified: Secondary | ICD-10-CM

## 2015-08-03 DIAGNOSIS — R05 Cough: Secondary | ICD-10-CM

## 2015-08-14 ENCOUNTER — Ambulatory Visit: Payer: Medicaid Other | Attending: Internal Medicine | Admitting: Rehabilitation

## 2015-08-14 ENCOUNTER — Encounter: Payer: Self-pay | Admitting: Rehabilitation

## 2015-08-14 DIAGNOSIS — R252 Cramp and spasm: Secondary | ICD-10-CM

## 2015-08-14 DIAGNOSIS — R269 Unspecified abnormalities of gait and mobility: Secondary | ICD-10-CM | POA: Insufficient documentation

## 2015-08-14 DIAGNOSIS — R258 Other abnormal involuntary movements: Secondary | ICD-10-CM | POA: Insufficient documentation

## 2015-08-14 DIAGNOSIS — G8110 Spastic hemiplegia affecting unspecified side: Secondary | ICD-10-CM | POA: Diagnosis present

## 2015-08-14 DIAGNOSIS — R2681 Unsteadiness on feet: Secondary | ICD-10-CM | POA: Diagnosis present

## 2015-08-14 NOTE — Patient Instructions (Addendum)
Hip Flexor Stretch    Lying on back near edge of bed, bend one leg, foot flat. Hang left leg over edge, relaxed, thigh resting entirely on bed for __2__ minutes. Repeat __2__ times. Do _1-2___ sessions per day.   http://gt2.exer.us/346   Copyright  VHI. All rights reserved.   HIP / KNEE: Flexion, Heel Slides - Supine    Slide left heel up toward buttocks, keeping leg in straight line. _10__ reps per set, _1__ sets per day, _7__ days per week Use towel or pillowcase under heel as needed.  Copyright  VHI. All rights reserved.   Hip Flexor Stretch    Lying on back near edge of bed with left leg near edge of bed.  Bend up left leg then slowly raise and lower off the edge of the bed and raise back up to bed.  Perform x 10 reps, don't forget to breath.     http://gt2.exer.us/346   Copyright  VHI. All rights reserved.   Bracing With Bridging (Hook-Lying)    Lie on your back with both leg bent up. Lift bottom. Repeat _10__ times. Do __1-2 times a day.   Copyright  VHI. All rights reserved.   Functional Quadriceps: Sit to Stand    Sit on edge of chair, feet flat on floor. Stand upright, extending knees fully.  Push up with right hand on lap and REALLY FOCUS ON STAYING IN THE MIDDLE.  DON'T LEAN TO THE RIGHT.  Repeat __10__ times per set. Do __1__ sets per session. Do __1-2__ sessions per day.  http://orth.exer.us/734   Copyright  VHI. All rights reserved.    FLEXION: Standing - Stable (Active)    Stand facing the counter with R hand on the counter, both feet flat. Bend left knee, bringing heel toward buttocks. Complete _1__ sets of __10_ repetitions. Perform _1-2__ sessions per day.  http://gtsc.exer.us/240   Copyright  VHI. All rights reserved.

## 2015-08-14 NOTE — Therapy (Signed)
[facility] [address] [place], [place], [address] Phone: [phone]   Fax:  [phone]  Physical Therapy Treatment  Patient Details  Name: DOWARD KHAIMOV MRN: [id number] Date of Birth: [birth year] Referring Provider: [name], MD  Encounter Date: 08/14/2015      PT End of Session - 08/14/15 0806    Visit Number 2   Number of Visits 4   Date for PT Re-Evaluation 09/29/15   Authorization Type Medicaid   Authorization Time Period 3 visits approved from 2/10-11/07/15   PT Start Time 0800   PT Stop Time 0845   PT Time Calculation (min) 45 min   Activity Tolerance Patient tolerated treatment well   Behavior During Therapy [facility] for tasks assessed/performed      Past Medical History  Diagnosis Date  . COPD (chronic obstructive pulmonary disease) ([place])   . Hypertension   . Stroke ([place]) sept 26,  20006     hx mini strokes in past, 1 large stroke  . Arthritis   . High cholesterol   . 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 Weakness of one side of body     L sided weakness after stroke  . History of gout   . GERD (gastroesophageal reflux disease)   . Recurrent kidney stones   . Difficulty sleeping     Past Surgical History  Procedure Laterality Date  . Left ey surgery  age 64  . Nephrolithotomy Right 10/05/2012    Procedure: NEPHROLITHOTOMY PERCUTANEOUS;  Surgeon: [name], MD;  Location: [facility];  Service: Urology;  Laterality: Right;  WITH CYSTO AND SURGEON ACCESS   . Cystoscopy N/A 10/05/2012    Procedure: CYSTOSCOPY, retrograde and right stent placement;  Surgeon: [name], MD;  Location: [facility];  Service: Urology;  Laterality: N/A;  . Nephrolithotomy Right 10/07/2012    Procedure: NEPHROLITHOTOMY PERCUTANEOUS SECOND LOOK. Stent exchange,Ureteroscopy ;  Surgeon: [name], MD;  Location: [facility];  Service: Urology;  Laterality: Right;  . Toenail excision     . Cystoscopy with retrograde pyelogram, ureteroscopy and stent placement Right 04/13/2014    Procedure: CYSTOSCOPY WITH RETROGRADE PYELOGRAM, URETEROSCOPY AND STENT PLACEMENT, LASER ABLATION OF SCAR TISSUE;  Surgeon: [name], MD;  Location: [facility];  Service: Urology;  Laterality: Right;  . Holmium laser application Right [id number]    Procedure: HOLMIUM LASER APPLICATION;  Surgeon: [name], MD;  Location: [facility];  Service: Urology;  Laterality: Right;    There were no vitals filed for this visit.  Visit Diagnosis:  Abnormality of gait  Unsteadiness  Spasticity  Spastic hemiplegia affecting nondominant side (HCC)      Subjective Assessment - 08/14/15 0804    Subjective "I brought my brace in today so we can try to figure something out.  The other girl told me to bring it."   Pertinent History CVA (03/26/2005); blind in L eye and low vision in R eye; HLD, HTN, gout, OA, L sciatica   Patient Stated Goals "Try to figure out a way to take some stress of this right side and see if there's a way to make the left side work halfway decent."   Currently in Pain? Yes   Pain Score 3    Pain Location Generalized   Pain Descriptors / Indicators Aching   Pain Type Chronic pain   Pain Radiating Towards generalized all over "aches and pain"  Pain Onset More than a month ago   Aggravating Factors  up moving for a long time, sitting in a low chair   Pain Relieving Factors lying down, use of electric blanket.          NMR:  Initiated HEP for LLE strengthening, isolated control and improved weight shift/WB in LLE.  See pt instruction for full details on exercises given and reps performed.  Pt hesitant with exercises stating "this makes me run out of gas."  Educated that pt will need to make this his "new norm" and work hard to improve LLE strength if wanting to make gains.  Pt verbalized understanding.  Also performed standing marching at counter top x 10 reps BLE and R hip abd x  10 reps in order to increase weight shift and WB on LLE.  Pt tolerated well and note good isolated movement in LLE, however this does not seem to carryover to gait without max cues.    Ortho check:  Note pt brought in L custom plastic PLS AFO during session.  Had pt work on donning/doffing brace w/shoe during session.  He was able to don/doff, however note that he does not lace shoes tightly and therefore am concerned about efficiency of brace.  He does have a velcro strap that works to secure brace somewhat, however educated that he should pull laces and could tuck them in prior to pulling velcro strap over.  Pt verbalized understanding.  Will attempt to figure out who issued brace and have them come to assess bracing needs, will discuss with primary PT.                          PT Education - 08/14/15 0806    Education provided Yes   Education Details HEP and possible modification to brace   Person(s) Educated Patient   Methods Explanation   Comprehension Verbalized understanding          PT Short Term Goals - 07/31/15 1108    PT SHORT TERM GOAL #1   Title Pt will perform initial home exercise program with mod I using paper handout to indicate safe HEP compliance.  (Target date: 08/28/15)   Baseline Pt completedly dependent for HEP   PT SHORT TERM GOAL #2   Title Pt will improve TUG from 38.34 seconds to < / = 35 seconds to indicate increased efficiency of mobility.  (Target date: 08/28/15)   Baseline 38.34 seconds with SPC   PT SHORT TERM GOAL #3   Title Pt will improve gait velocity from 1.21 ft/sec to > / = 1.51 ft/sec to indicate increased efficiency of ambulation.  (Target date: 08/28/15)   Baseline 1.21 ft/sec with SPC   PT SHORT TERM GOAL #4   Title Pt will demo consistent sit <> stand from standard chair with mod I, single UE use, and symmetrical WB to indicate increased independence and to manage LUE tone.   (Target date: 08/28/15)   Baseline Sit <> stand with  supervision and majority of weightbearing on RLE causing increased hypertonia in LUE.           PT Long Term Goals - 07/31/15 1118    PT LONG TERM GOAL #1   Title Pt will independently perform HEP to maximize functional gains made in PT.    (Target date: 09/25/15)   Baseline Pt completely dependent for HEP   PT LONG TERM GOAL #2   Title Pt will improve  TUG from 38.34 seconds to < / =  33 seconds to indicate increased efficiency of mobility.   (Target date: 09/25/15)   Baseline 38.34 with SPC   PT LONG TERM GOAL #3   Title Pt will improve gait velocity from 1.21 ft/sec to > / = 1.81 ft/sec to indicate decreased risk of recurrent falls.  (Target date: 09/25/15)   Baseline 1.21 ft/sec with SPC   PT LONG TERM GOAL #4   Title Pt will don/doff AFO with mod I to enable pt to wear AFO to normalize gait pattern, manage LUE/LLE tone, and increase efficiency of ambulation.  (Target date: 09/25/15)   Baseline Pt does not wear L AFO due to inability to don/doff without assistance.   PT LONG TERM GOAL #5   Title Pt will perform floor transfer with supervision to enable pt to safely recover from fall.  (Target date: 09/25/15)   Baseline Pt reports fear of falling due to inability to get up off of floor in the case of a fall.               Plan - 08/14/15 0807    Clinical Impression Statement Skilled session focused on establishing HEP for LLE strengthening and midline orientation, see pt instruction.  Also had discussion on brace and how best to modify.  Note he is able to get brace on by himself, however he does not like to lace up shoe, but leaves loose and pulls velcro strap over.  Brace does assist well for gait and seems to make pt more efficient, however he requires max cues on importance of brace being snug in shoe for max support.  Will attempt to locate who provided brace for pt.     Pt will benefit from skilled therapeutic intervention in order to improve on the following deficits Abnormal  gait;Decreased activity tolerance;Decreased balance;Postural dysfunction;Pain;Decreased endurance;Decreased coordination;Impaired sensation;Decreased strength;Impaired tone   Rehab Potential Good   Clinical Impairments Affecting Rehab Potential Insurance coverage limited to 3 visits total   PT Frequency Other (comment)  3 total visits   PT Duration 8 weeks  over the course of 8 weeks   PT Treatment/Interventions Gait training;Stair training;Functional mobility training;Therapeutic activities;Therapeutic exercise;Balance training;Orthotic Fit/Training;Patient/family education;Neuromuscular re-education;DME Instruction;Vestibular;ADLs/Self Care Home Management   PT Next Visit Plan Check HEP for compliance and performance, modify if needed.    Attempt to assist pt with donning/doffing AFO-we did but could benefit from continued practice. Consider scheduling appt with orthotist to modify AFO, if indicated.    Consulted and Agree with Plan of Care Patient        Problem List There are no active problems to display for this patient.   Cameron Sprang, PT, MPT [facility] [address] [place], [place], [address] Phone: [phone]   Fax:  [phone] 08/14/2015, 9:38 AM  Name: HOYET VEALE MRN: [id number] Date of Birth: Apr 04, 1961

## 2015-08-17 ENCOUNTER — Ambulatory Visit: Payer: Medicaid Other | Admitting: Rehabilitation

## 2015-08-21 ENCOUNTER — Ambulatory Visit: Payer: Medicaid Other | Admitting: Physical Therapy

## 2015-08-23 ENCOUNTER — Ambulatory Visit (HOSPITAL_COMMUNITY)
Admission: RE | Admit: 2015-08-23 | Discharge: 2015-08-23 | Disposition: A | Discharge status: Home / Self Care | Payer: Medicaid Other | Source: Ambulatory Visit | Attending: Gastroenterology | Admitting: Gastroenterology

## 2015-08-23 ENCOUNTER — Ambulatory Visit: Payer: Medicaid Other | Admitting: Physical Therapy

## 2015-08-23 DIAGNOSIS — R05 Cough: Secondary | ICD-10-CM

## 2015-08-23 DIAGNOSIS — R1312 Dysphagia, oropharyngeal phase: Secondary | ICD-10-CM | POA: Diagnosis present

## 2015-08-23 DIAGNOSIS — R1310 Dysphagia, unspecified: Secondary | ICD-10-CM

## 2015-08-23 DIAGNOSIS — R059 Cough, unspecified: Secondary | ICD-10-CM

## 2015-08-28 ENCOUNTER — Ambulatory Visit: Payer: Medicaid Other | Admitting: Physical Therapy

## 2015-08-28 DIAGNOSIS — R269 Unspecified abnormalities of gait and mobility: Secondary | ICD-10-CM

## 2015-08-28 DIAGNOSIS — R252 Cramp and spasm: Secondary | ICD-10-CM

## 2015-08-28 DIAGNOSIS — R2681 Unsteadiness on feet: Secondary | ICD-10-CM

## 2015-08-28 DIAGNOSIS — G8110 Spastic hemiplegia affecting unspecified side: Secondary | ICD-10-CM

## 2015-08-28 NOTE — Patient Instructions (Signed)
Hip Flexor Stretch    Lying on back near edge of bed, bend one leg, foot flat. Hang left leg over edge, relaxed, thigh resting entirely on bed for __2__ minutes. Repeat __2__ times. Do _1-2___ sessions per day.   http://gt2.exer.us/346   Copyright  VHI. All rights reserved.   HIP / KNEE: Flexion, Heel Slides - Supine    Slide left heel up toward buttocks, keeping leg in straight line. _10__ reps per set, _1__ sets per day, _7__ days per week Use towel or pillowcase under heel as needed.  Copyright  VHI. All rights reserved.   Hip Flexor Stretch    Lying on back near edge of bed with left leg near edge of bed. Bend up left leg then slowly raise and lower off the edge of the bed and raise back up to bed. Perform x 10 reps, don't forget to breath.    http://gt2.exer.us/346   Copyright  VHI. All rights reserved.   Bracing With Bridging (Hook-Lying)    Lie on your back with both leg bent up. Lift bottom. Repeat _10__ times. Do __1-2 times a day.   Copyright  VHI. All rights reserved.   Functional Quadriceps: Sit to Stand    Sit on edge of chair, feet flat on floor. Stand upright, extending knees fully. Push up with right hand on lap and REALLY FOCUS ON STAYING IN THE MIDDLE. DON'T LEAN TO THE RIGHT.  Repeat __10__ times per set. Do __1__ sets per session. Do __1-2__ sessions per day.  http://orth.exer.us/734   Copyright  VHI. All rights reserved.    FLEXION: Standing - Stable (Active)    Stand facing the counter with R hand on the counter, both feet flat. Bend left knee, bringing heel toward buttocks. Complete _1__ sets of __10_ repetitions. Perform _1-2__ sessions per day.  http://gtsc.exer.us/240   Copyright  VHI. All rights reserved.

## 2015-08-28 NOTE — Therapy (Signed)
[facility] [address] [place], [place], [address] Phone: [phone]   Fax:  [phone]  Physical Therapy Treatment  Patient Details  Name: [name] MRN: [id number] Date of Birth: [birth year] Referring Provider: [name], MD  Encounter Date: 08/28/2015      PT End of Session - 08/28/15 1930    Visit Number 3   Number of Visits 4   Date for PT Re-Evaluation 09/29/15   Authorization Type Medicaid   Authorization Time Period 3 visits approved from 2/10-11/07/15   PT Start Time 0758   PT Stop Time 0845   PT Time Calculation (min) 47 min   Activity Tolerance Patient tolerated treatment well   Behavior During Therapy [facility] for tasks assessed/performed      Past Medical History  Diagnosis Date  . COPD (chronic obstructive pulmonary disease) ([place])   . Hypertension   . Stroke ([place]) sept 26,  20006     hx mini strokes in past, 1 large stroke  . Arthritis   . High cholesterol   . 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 Weakness of one side of body     L sided weakness after stroke  . History of gout   . GERD (gastroesophageal reflux disease)   . Recurrent kidney stones   . Difficulty sleeping     Past Surgical History  Procedure Laterality Date  . Left ey surgery  age 89  . Nephrolithotomy Right 10/05/2012    Procedure: NEPHROLITHOTOMY PERCUTANEOUS;  Surgeon: [name], MD;  [facility]: [facility];  Service: Urology;  Laterality: Right;  WITH CYSTO AND SURGEON ACCESS   . Cystoscopy N/A 10/05/2012    Procedure: CYSTOSCOPY, retrograde and right stent placement;  Surgeon: [name], MD;  Location: [facility];  Service: Urology;  Laterality: N/A;  . Nephrolithotomy Right 10/07/2012    Procedure: NEPHROLITHOTOMY PERCUTANEOUS SECOND LOOK. Stent exchange,Ureteroscopy ;  Surgeon: [name], MD;  Location: [facility];  Service: Urology;  Laterality: Right;  . Toenail excision     . Cystoscopy with retrograde pyelogram, ureteroscopy and stent placement Right 04/13/2014    Procedure: CYSTOSCOPY WITH RETROGRADE PYELOGRAM, URETEROSCOPY AND STENT PLACEMENT, LASER ABLATION OF SCAR TISSUE;  Surgeon: [name], MD;  Location: [facility];  Service: Urology;  Laterality: Right;  . Holmium laser application Right [id number]/[id number]    Procedure: HOLMIUM LASER APPLICATION;  Surgeon: [name], MD;  [facility]: [facility];  Service: Urology;  Laterality: Right;    There were no vitals filed for this visit.  Visit Diagnosis:  Abnormality of gait  Unsteadiness  Spasticity  Spastic hemiplegia affecting nondominant side (HCC)      Subjective Assessment - 08/28/15 1310    Subjective "Those exercises are hard. After I do them, I can't do nothing else. I run out of gas."   Pertinent History CVA (03/26/2005); blind in L eye and low vision in R eye; HLD, HTN, gout, OA, L sciatica   Patient Stated Goals "Try to figure out a way to take some stress of this right side and see if there's a way to make the left side work halfway decent."   Currently in Pain? No/denies                         [facility] Adult PT Treatment/Exercise - 08/28/15 0001    Bed Mobility   Bed Mobility Supine to  Sit;Sit to Supine   Supine to Sit 5: Supervision;6: Modified independent (Device/Increase time)   Supine to Sit Details (indicate cue type and reason) Verbal/tactile cueing for logroll technique to increase efficiency of supine <> sit with effective within-session carryover.   Sit to Supine 5: Supervision;6: Modified independent (Device/Increase time)   Sit to Supine - Details (indicate cue type and reason) See above.   Transfers   Transfers Sit to Stand;Stand to Sit   Sit to Stand 6: Modified independent (Device/Increase time)   Sit to Stand Details (indicate cue type and reason) improved symmetry of LE WB   Stand to Sit 6: Modified independent (Device/Increase time)   Stand to Sit Details  See above.   Ambulation/Gait   Ambulation/Gait Yes   Ambulation/Gait Assistance 5: Supervision   Ambulation Distance (Feet) 345 Feet   Assistive device Straight cane   Gait Pattern Step-through pattern;Decreased arm swing - right;Decreased step length - right;Decreased weight shift to left;Decreased hip/knee flexion - left;Decreased stance time - left;Decreased dorsiflexion - left;Poor foot clearance - left;Wide base of support;Left circumduction  trunk extension to initiate LLE swing; L ankle supination   Ambulation Surface Level;Indoor   Gait Comments Gait [phone]' with SPC using L [place] Reaction AFO; noted improved L hip/fknee flexion during LLE advancement; however, also noted M/L instability at ankle during L initial contact to midstance. During subsequent gait trial x115' with SPC and L Reaction AFO with supination control strap, noted improved M/L stability at L ankle but continued L forefoot supination during LLe advancement to initial contact. Also onted pt difficulty donning/doffing L Reaction AFO with supination control strap. Final gait trial with L Blue Rocker AFO: improved L hip/knee flexion (without circumduction) during LLE advancement; L forefoot supination controlled during LLE advancement and no M/L instability during LLE stance; and improved L lateral weight shift during L stance, improved anterior weight shift during RLE advancement, as well as increased pt-reported comfort and confidence with L Blue Rocker as compared with Reaction AFO.    Exercises   Exercises Other Exercises   Other Exercises  Reviewed all home exercises provided at prior PT session. Pt performed L hip flexor stretch x2 minutes; all other home exercises performed x5 reps (rather than x10 reps, as directed for HEP) for time management. See Pt Instructions for details on all exercises, reps, sets, frequency, and duration. Pt mod I for HEP during this session.                PT Education - 08/28/15 1311     Education provided Yes   Education Details HEP: recommending pt perform exercises 1-3 on M, W, F and 4-6 on T, R, and Sat.  Recommending reassessment of AFO by orthotist at next (final) PT session.   Person(s) Educated Patient   Methods Explanation;Handout   Comprehension Verbalized understanding          PT Short Term Goals - 08/28/15 1932    PT SHORT TERM GOAL #1   Title Pt will perform initial home exercise program with mod I using paper handout to indicate safe HEP compliance.  (Target date: 08/28/15)   Baseline Met 2/27.   Status Achieved   PT SHORT TERM GOAL #2   Title Pt will improve TUG from 38.34 seconds to < / = 35 seconds to indicate increased efficiency of mobility.  (Target date: 08/28/15)   Baseline 38.34 seconds with SPC   Status On-going   PT SHORT TERM GOAL #3   Title Pt  will improve gait velocity from 1.21 ft/sec to > / = 1.51 ft/sec to indicate increased efficiency of ambulation.  (Target date: 08/28/15)   Baseline 1.21 ft/sec with SPC   Status On-going   PT SHORT TERM GOAL #4   Title Pt will demo consistent sit <> stand from standard chair with mod I, single UE use, and symmetrical WB to indicate increased independence and to manage LUE tone.   (Target date: 08/28/15)   Baseline Met 2/27.   Status Achieved           PT Long Term Goals - 07/31/15 1118    PT LONG TERM GOAL #1   Title Pt will independently perform HEP to maximize functional gains made in PT.    (Target date: 09/25/15)   Baseline Pt completely dependent for HEP   PT LONG TERM GOAL #2   Title Pt will improve TUG from 38.34 seconds to < / =  33 seconds to indicate increased efficiency of mobility.   (Target date: 09/25/15)   Baseline 38.34 with SPC   PT LONG TERM GOAL #3   Title Pt will improve gait velocity from 1.21 ft/sec to > / = 1.81 ft/sec to indicate decreased risk of recurrent falls.  (Target date: 09/25/15)   Baseline 1.21 ft/sec with SPC   PT LONG TERM GOAL #4   Title Pt will don/doff  AFO with mod I to enable pt to wear AFO to normalize gait pattern, manage LUE/LLE tone, and increase efficiency of ambulation.  (Target date: 09/25/15)   Baseline Pt does not wear L AFO due to inability to don/doff without assistance.   PT LONG TERM GOAL #5   Title Pt will perform floor transfer with supervision to enable pt to safely recover from fall.  (Target date: 09/25/15)   Baseline Pt reports fear of falling due to inability to get up off of floor in the case of a fall.               Plan - 08/28/15 1935    Clinical Impression Statement Session focused on reviewing HEP and assessing gait stability with use of L carbon fiber AFO's. When using L Blue Rocker AFO, noted improved L hip/knee flexion during LLE advancement, increased L ankle stability during L stance, and improved anterior/lateral weight shift to L side. Recommending orthotist consult at next session to assess if pt appropriate candidate for L AFO. Pt in full agreement with plan.   Pt will benefit from skilled therapeutic intervention in order to improve on the following deficits Abnormal gait;Decreased activity tolerance;Decreased balance;Postural dysfunction;Pain;Decreased endurance;Decreased coordination;Impaired sensation;Decreased strength;Impaired tone   Rehab Potential Good   Clinical Impairments Affecting Rehab Potential Insurance coverage limited to 3 visits total   PT Frequency Other (comment)  3 total visits   PT Duration --  over the course of 8 weeks   PT Treatment/Interventions Gait training;Stair training;Functional mobility training;Therapeutic activities;Therapeutic exercise;Balance training;Orthotic Fit/Training;Patient/family education;Neuromuscular re-education;DME Instruction;Vestibular;ADLs/Self Care Home Management   PT Next Visit Plan Orthotist planning to be present to assess pt appropriateness for L AFO.  Assess remaining goals and DC.   Consulted and Agree with Plan of Care Patient         Problem List There are no active problems to display for this patient.  Billie Ruddy, PT, [facility] [address] [place], [place], [address] Phone: [phone]   Fax:  [phone] 08/28/2015, 7:41 PM   Name: [name] MRN: [id number] Date  of Birth: 02/17/1961

## 2015-08-30 ENCOUNTER — Ambulatory Visit: Payer: Medicaid Other | Admitting: Physical Therapy

## 2015-09-04 ENCOUNTER — Ambulatory Visit: Payer: Medicaid Other | Admitting: Physical Therapy

## 2015-09-06 ENCOUNTER — Ambulatory Visit: Payer: Medicaid Other | Admitting: Physical Therapy

## 2015-09-11 ENCOUNTER — Ambulatory Visit: Payer: Medicaid Other | Attending: Internal Medicine | Admitting: Physical Therapy

## 2015-09-11 DIAGNOSIS — R258 Other abnormal involuntary movements: Secondary | ICD-10-CM | POA: Diagnosis present

## 2015-09-11 DIAGNOSIS — G8110 Spastic hemiplegia affecting unspecified side: Secondary | ICD-10-CM | POA: Insufficient documentation

## 2015-09-11 DIAGNOSIS — R2681 Unsteadiness on feet: Secondary | ICD-10-CM | POA: Insufficient documentation

## 2015-09-11 DIAGNOSIS — R252 Cramp and spasm: Secondary | ICD-10-CM

## 2015-09-11 DIAGNOSIS — R269 Unspecified abnormalities of gait and mobility: Secondary | ICD-10-CM

## 2015-09-11 NOTE — Therapy (Signed)
[facility] [address] [place], [place], [address] Phone: [phone]   Fax:  [phone]  Physical Therapy Treatment and Discharge Summary  Patient Details  Name: [name] MRN: [id number] Date of Birth: [birth year] Referring Provider: [name], MD  Encounter Date: 09/11/2015      PT End of Session - 09/11/15 0957    Visit Number 4   Number of Visits 4   Date for PT Re-Evaluation 09/29/15   Authorization Type Medicaid   Authorization Time Period 3 visits approved from 2/10-11/07/15   PT Start Time 0848   PT Stop Time 0915   PT Time Calculation (min) 27 min   Activity Tolerance Patient tolerated treatment well   Behavior During Therapy [facility] for tasks assessed/performed      Past Medical History  Diagnosis Date  . COPD (chronic obstructive pulmonary disease) ([facility])   . Hypertension   . Stroke ([place]) sept 26,  20006     hx mini strokes in past, 1 large stroke  . Arthritis   . High cholesterol   . 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 Weakness of one side of body     L sided weakness after stroke  . History of gout   . GERD (gastroesophageal reflux disease)   . Recurrent kidney stones   . Difficulty sleeping     Past Surgical History  Procedure Laterality Date  . Left ey surgery  age 36  . Nephrolithotomy Right 10/05/2012    Procedure: NEPHROLITHOTOMY PERCUTANEOUS;  Surgeon: [name], MD;  Location: [facility];  Service: Urology;  Laterality: Right;  WITH CYSTO AND SURGEON ACCESS   . Cystoscopy N/A 10/05/2012    Procedure: CYSTOSCOPY, retrograde and right stent placement;  Surgeon: [name], MD;  Location: [facility];  Service: Urology;  Laterality: N/A;  . Nephrolithotomy Right 10/07/2012    Procedure: NEPHROLITHOTOMY PERCUTANEOUS SECOND LOOK. Stent exchange,Ureteroscopy ;  Surgeon: [name], MD;  Location: [facility];  Service: Urology;  Laterality:  Right;  . Toenail excision    . Cystoscopy with retrograde pyelogram, ureteroscopy and stent placement Right 04/13/2014    Procedure: CYSTOSCOPY WITH RETROGRADE PYELOGRAM, URETEROSCOPY AND STENT PLACEMENT, LASER ABLATION OF SCAR TISSUE;  Surgeon: [name], MD;  Location: [facility];  Service: Urology;  Laterality: Right;  . Holmium laser application Right 14/97/0263    Procedure: HOLMIUM LASER APPLICATION;  Surgeon: [name], MD;  Location: [facility];  Service: Urology;  Laterality: Right;    There were no vitals filed for this visit.  Visit Diagnosis:  Abnormality of gait  Unsteadiness  Spasticity  Spastic hemiplegia affecting nondominant side [facility])      Subjective Assessment - 09/11/15 0947    Subjective "Is that man here to talk about my brace?"  Pt requests to finish 15 minutes early today due to other scheduled medical appt at 9:15.   Pertinent History CVA (03/26/2005); blind in L eye and low vision in R eye; HLD, HTN, gout, OA, L sciatica   Patient Stated Goals "Try to figure out a way to take some stress of this right side and see if there's a way to make the left side work halfway decent."   Currently in Pain? Yes   Pain Score 8    Pain Orientation Left   Pain Descriptors / Indicators Aching   Pain Radiating Towards generalized "all over aches and pain"  Pain Onset More than a month ago   Aggravating Factors  "this darn cold weather," per pt   Pain Relieving Factors lying down, use of electric blanket   Effect of Pain on Daily Activities avoids sitting in low chairs; limits activity in general   Multiple Pain Sites No                         [facility] Adult PT Treatment/Exercise - 09/11/15 0001    Transfers   Transfers Sit to Stand;Stand to Sit   Sit to Stand 6: Modified independent (Device/Increase time)   Stand to Sit 6: Modified independent (Device/Increase time)   Ambulation/Gait   Ambulation/Gait Yes   Ambulation/Gait Assistance 6: Modified  independent (Device/Increase time);5: Supervision   Ambulation/Gait Assistance Details Gait 2 x100' with SPC without AFO requiring (S) due to L ankle instability, LLE extensor tone. Gait [phone]' with L PLS AFO with noted improvement in L ankle DF during LLE advancement, increased L ankle stability during stance, and improved efficiency of ambulation.   Ambulation Distance (Feet) 315 Feet   Assistive device Straight cane   Gait Pattern Step-through pattern;Decreased arm swing - right;Decreased step length - right;Decreased weight shift to left;Decreased hip/knee flexion - left;Decreased stance time - left;Decreased dorsiflexion - left;Poor foot clearance - left;Wide base of support;Left circumduction  no GBT:DVVOH extension during L preswing, L ankle supination   Ambulation Surface Level;Indoor   Therapeutic Activites    Therapeutic Activities Other Therapeutic Activities   Other Therapeutic Activities Donned L Blue Rocker AFO with increased time, cueing for seated L figure 4 to don and increased time/cueing for management of straps x2. Pt required less time to don L PLS AFO, was able to more easily don single strap, and was able to utilize strap on L high top shoes without lateral strut (as on L Blue Rocker). Pt verbally expressed increased confidence and ease of donning and doffing L PLS (WalkOn) AFO.                PT Education - 09/11/15 0950    Education provided Yes   Education Details Recommending L PLS AFO to enable pt to independently don/doff and to control L ankle during gait.   Person(s) Educated Patient   Methods Explanation   Comprehension Verbalized understanding          PT Short Term Goals - 09/11/15 0957    PT SHORT TERM GOAL #1   Title Pt will perform initial home exercise program with mod I using paper handout to indicate safe HEP compliance.  (Target date: 08/28/15)   Baseline Met 2/27.   Status Achieved   PT SHORT TERM GOAL #2   Title Pt will improve TUG from  38.34 seconds to < / = 35 seconds to indicate increased efficiency of mobility.  (Target date: 08/28/15)   Baseline Unable to formally assess due to time constraint on discharge, as pt needed to leave 15 minutes early for another medical appt.   Status Not Met   PT SHORT TERM GOAL #3   Title Pt will improve gait velocity from 1.21 ft/sec to > / = 1.51 ft/sec to indicate increased efficiency of ambulation.  (Target date: 08/28/15)   Baseline 1.21 ft/sec with SPC   Status On-going   PT SHORT TERM GOAL #4   Title Pt will demo consistent sit <> stand from standard chair with mod I, single UE use, and symmetrical WB to indicate increased independence  and to manage LUE tone.   (Target date: 08/28/15)   Baseline Met 2/27.   Status Achieved           PT Long Term Goals - 09/11/15 1660    PT LONG TERM GOAL #1   Title Pt will independently perform HEP to maximize functional gains made in PT.    (Target date: 09/25/15)   Status Achieved   PT LONG TERM GOAL #2   Title Pt will improve TUG from 38.34 seconds to < / =  33 seconds to indicate increased efficiency of mobility.   (Target date: 09/25/15)   Baseline Unable to formally assess due to time constraint on discharge, as pt needed to leave 15 minutes early for another medical appt.   Status Not Met   PT LONG TERM GOAL #3   Title Pt will improve gait velocity from 1.21 ft/sec to > / = 1.81 ft/sec to indicate decreased risk of recurrent falls.  (Target date: 09/25/15)   Baseline 3/13: 2.20 ft/sec with SPC and L PLS AFO.   Status Achieved   PT LONG TERM GOAL #4   Title Pt will don/doff AFO with mod I to enable pt to wear AFO to normalize gait pattern, manage LUE/LLE tone, and increase efficiency of ambulation.  (Target date: 09/25/15)   Baseline 3/13: Met when donning/doffing L PLS (WalkOn) AFO   Status Achieved   PT LONG TERM GOAL #5   Title Pt will perform floor transfer with supervision to enable pt to safely recover from fall.  (Target date:  09/25/15)   Baseline Unable to formally assess due to time constraint on discharge, as pt needed to leave 15 minutes early for another medical appt.   Status Not Met               Plan - 09/11/15 1001    Clinical Impression Statement Patient will be discharged from outpatient PT at this time due to pt having utilized all appts covered by insurance. Pt demonstrated independence with HEP and with donning/doffing L AFO (when using PLS). Gait velocity was also markedly increased when using L PLS AFO. Goals addressing TUG and floor transfer not formally assessed due to time constraint on discharge, as pt needed to leave 15 minutes early for another medical appt. Orthotist present for this session to assess if pt appropriate candidate for new L AFO, as he is unable to don/doff current custom AFO independently and lives alone. Orthotist, Brooke Pace, recommdended trial of carbon fiber PLS AFO, which was easier for pt to don/doff, improved L ankle stability during gait, and increased efficiency of ambulation. Pt, orthotist, and this PT all in agreement to pursue L carbon fiber PLS AFO. Pt educated on PT goals, progress, and DC plan. Pt verbalized understanding and was in full agreement with DC plan.   Consulted and Agree with Plan of Care Patient        Problem List There are no active problems to display for this patient.   PHYSICAL THERAPY DISCHARGE SUMMARY  Visits from Start of Care: 3  Current functional level related to goals / functional outcomes: See above goals and goal statuses.   Remaining deficits: Pt continues to demonstrate gait impairments,  LLE extensor tone, and decreased functional endurance/activity tolerance. Pt is independent with HEP to address said impairments and (pending MD order) will receive L AFO to maximize safety and efficiency of gait.    Education / Equipment: HEP, L AFO recommendations, education to promote independence  and maximize efficiency with  donning/doffing L AFO.  Plan: Patient agrees to discharge.  Patient goals were partially met. Patient is being discharged due to financial reasons.  ?????        Billie Ruddy, PT, DPT [facility] [address] [place], [place], [address] Phone: [phone]   Fax:  [phone] 09/11/2015, 10:07 AM  Name: RAYHAAN HUSTER MRN: [id number] Date of Birth: Sep 16, 1960

## 2015-09-21 ENCOUNTER — Other Ambulatory Visit: Payer: Self-pay | Admitting: Gastroenterology

## 2015-09-22 ENCOUNTER — Encounter (HOSPITAL_COMMUNITY): Payer: Self-pay | Admitting: *Deleted

## 2015-09-26 ENCOUNTER — Other Ambulatory Visit: Payer: Self-pay | Admitting: Gastroenterology

## 2015-09-26 ENCOUNTER — Encounter (HOSPITAL_COMMUNITY): Payer: Self-pay | Admitting: Anesthesiology

## 2015-09-26 NOTE — Anesthesia Preprocedure Evaluation (Addendum)
Anesthesia Evaluation  Patient identified by MRN, date of birth, ID band Patient awake    Reviewed: Allergy & Precautions, NPO status , Patient's Chart, lab work & pertinent test results  Airway Mallampati: II  TM Distance: >3 FB Neck ROM: Full    Dental no notable dental hx.    Pulmonary COPD,  COPD inhaler, Current Smoker,    Pulmonary exam normal breath sounds clear to auscultation       Cardiovascular hypertension, Normal cardiovascular exam Rhythm:Regular Rate:Normal     Neuro/Psych Left arm and leg weakness CVA, Residual Symptoms negative psych ROS   GI/Hepatic negative GI ROS, Neg liver ROS, GERD  Medicated,  Endo/Other  negative endocrine ROS  Renal/GU Renal disease  negative genitourinary   Musculoskeletal  (+) Arthritis ,   Abdominal (+) + obese,   Peds negative pediatric ROS (+)  Hematology negative hematology ROS (+)   Anesthesia Other Findings   Reproductive/Obstetrics negative OB ROS                            Anesthesia Physical Anesthesia Plan  ASA: III  Anesthesia Plan: MAC   Post-op Pain Management:    Induction: Intravenous  Airway Management Planned: Natural Airway  Additional Equipment:   Intra-op Plan:   Post-operative Plan:   Informed Consent: I have reviewed the patients History and Physical, chart, labs and discussed the procedure including the risks, benefits and alternatives for the proposed anesthesia with the patient or authorized representative who has indicated his/her understanding and acceptance.   Dental advisory given  Plan Discussed with: CRNA  Anesthesia Plan Comments:         Anesthesia Quick Evaluation

## 2015-09-27 ENCOUNTER — Ambulatory Visit (HOSPITAL_COMMUNITY)
Admission: RE | Admit: 2015-09-27 | Discharge: 2015-09-27 | Disposition: A | Discharge status: Home / Self Care | Payer: Medicaid Other | Source: Ambulatory Visit | Attending: Gastroenterology | Admitting: Gastroenterology

## 2015-09-27 ENCOUNTER — Ambulatory Visit (HOSPITAL_COMMUNITY): Payer: Medicaid Other | Admitting: Anesthesiology

## 2015-09-27 ENCOUNTER — Encounter (HOSPITAL_COMMUNITY): Payer: Self-pay

## 2015-09-27 ENCOUNTER — Encounter (HOSPITAL_COMMUNITY)
Admission: RE | Disposition: A | Discharge status: Home / Self Care | Payer: Self-pay | Source: Ambulatory Visit | Attending: Gastroenterology

## 2015-09-27 DIAGNOSIS — J449 Chronic obstructive pulmonary disease, unspecified: Secondary | ICD-10-CM | POA: Diagnosis not present

## 2015-09-27 DIAGNOSIS — Z1211 Encounter for screening for malignant neoplasm of colon: Secondary | ICD-10-CM | POA: Insufficient documentation

## 2015-09-27 DIAGNOSIS — K219 Gastro-esophageal reflux disease without esophagitis: Secondary | ICD-10-CM | POA: Diagnosis not present

## 2015-09-27 DIAGNOSIS — Z79899 Other long term (current) drug therapy: Secondary | ICD-10-CM | POA: Insufficient documentation

## 2015-09-27 DIAGNOSIS — Z86010 Personal history of colonic polyps: Secondary | ICD-10-CM | POA: Diagnosis not present

## 2015-09-27 DIAGNOSIS — D122 Benign neoplasm of ascending colon: Secondary | ICD-10-CM | POA: Diagnosis not present

## 2015-09-27 DIAGNOSIS — I10 Essential (primary) hypertension: Secondary | ICD-10-CM | POA: Diagnosis not present

## 2015-09-27 DIAGNOSIS — I6930 Unspecified sequelae of cerebral infarction: Secondary | ICD-10-CM | POA: Insufficient documentation

## 2015-09-27 DIAGNOSIS — N289 Disorder of kidney and ureter, unspecified: Secondary | ICD-10-CM | POA: Diagnosis not present

## 2015-09-27 DIAGNOSIS — F17200 Nicotine dependence, unspecified, uncomplicated: Secondary | ICD-10-CM | POA: Diagnosis not present

## 2015-09-27 DIAGNOSIS — D120 Benign neoplasm of cecum: Secondary | ICD-10-CM | POA: Insufficient documentation

## 2015-09-27 DIAGNOSIS — D123 Benign neoplasm of transverse colon: Secondary | ICD-10-CM | POA: Insufficient documentation

## 2015-09-27 HISTORY — PX: COLONOSCOPY WITH PROPOFOL: SHX5780

## 2015-09-27 SURGERY — COLONOSCOPY WITH PROPOFOL
Anesthesia: Monitor Anesthesia Care

## 2015-09-27 MED ORDER — PROPOFOL 10 MG/ML IV BOLUS
INTRAVENOUS | Status: AC
  Filled 2015-09-27: qty 40

## 2015-09-27 MED ORDER — LACTATED RINGERS IV SOLN
INTRAVENOUS | Status: DC
  Administered 2015-09-27: 1000 mL via INTRAVENOUS

## 2015-09-27 MED ORDER — SODIUM CHLORIDE 0.9 % IV SOLN: INTRAVENOUS | Status: DC

## 2015-09-27 MED ORDER — PROPOFOL 10 MG/ML IV BOLUS
INTRAVENOUS | Status: DC | PRN
  Administered 2015-09-27 (×2): 10 mg via INTRAVENOUS
  Administered 2015-09-27: 20 mg via INTRAVENOUS
  Administered 2015-09-27 (×3): 10 mg via INTRAVENOUS
  Administered 2015-09-27 (×2): 20 mg via INTRAVENOUS
  Administered 2015-09-27: 10 mg via INTRAVENOUS
  Administered 2015-09-27 (×5): 20 mg via INTRAVENOUS
  Administered 2015-09-27: 30 mg via INTRAVENOUS
  Administered 2015-09-27 (×3): 10 mg via INTRAVENOUS

## 2015-09-27 SURGICAL SUPPLY — 22 items

## 2015-09-27 NOTE — Anesthesia Postprocedure Evaluation (Signed)
Anesthesia Post Note  Patient: [name]  Procedure(s) Performed: Procedure(s) (LRB): COLONOSCOPY WITH PROPOFOL (N/A)  Patient location during evaluation: PACU Anesthesia Type: MAC Level of consciousness: awake and alert Pain management: pain level controlled Vital Signs Assessment: post-procedure vital signs reviewed and stable Respiratory status: spontaneous breathing, nonlabored ventilation, respiratory function stable and patient connected to nasal cannula oxygen Cardiovascular status: stable and blood pressure returned to baseline Anesthetic complications: no    Last Vitals:  Filed Vitals:   09/27/15 0900 09/27/15 0909  BP: 150/62 148/71  Pulse: 80 77  Temp:    Resp: 18 19    Last Pain: There were no vitals filed for this visit.               Landry Kamath J

## 2015-09-27 NOTE — Op Note (Signed)
[facility] Patient Name: [name] Procedure Date: 09/27/2015 MRN: [id number] Attending MD: [name] , MD Date of Birth: 1961/01/29 CSN:  Age: 55 Admit Type: Outpatient Procedure:                Colonoscopy Indications:              High risk colon cancer surveillance: Personal                            history of colonic polyps, Last colonoscopy: 2011 Providers:                [name], MD, Laverta Baltimore, RN, Tashima Scarpulla Dalton, Technician Referring MD:              Medicines:                Propofol per Anesthesia Complications:            No immediate complications. Estimated Blood Loss:     Estimated blood loss was minimal. Procedure:                Pre-Anesthesia Assessment:                           - Prior to the procedure, a History and Physical                            was performed, and patient medications and                            allergies were reviewed. The patient's tolerance of                            previous anesthesia was also reviewed. The risks                            and benefits of the procedure and the sedation                            options and risks were discussed with the patient.                            All questions were answered, and informed consent                            was obtained. Prior Anticoagulants: The patient has                            taken aspirin. ASA Grade Assessment: III - A                            patient with severe systemic disease. After  reviewing the risks and benefits, the patient was                            deemed in satisfactory condition to undergo the                            procedure.                           After obtaining informed consent, the colonoscope                            was passed under direct vision. Throughout the                            procedure, the patient's blood pressure, pulse,  and                            oxygen saturations were monitored continuously. The                            [id number] scope was introduced through                            the anus and advanced to the the cecum, identified                            by appendiceal orifice and ileocecal valve. The                            ileocecal valve, appendiceal orifice, and rectum                            were photographed. The entire colon was examined.                            The colonoscopy was performed without difficulty.                            The patient tolerated the procedure well. The                            quality of the bowel preparation was fair. Scope In: Scope Out: Findings:      The perianal and digital rectal examinations were normal.      Despite two day preparation, the bowel prep diffusely fair; semisolid       and viscous stool obscured some views throughout colon; diminutive or       subtle sessile polyps could easily have been missed.      Two sessile polyps were found in the ascending colon and cecum. The       polyps were 3 to 4 mm in size. These polyps were removed with a cold       biopsy forceps. Resection and retrieval were complete.      A 5 mm polyp was found  in the transverse colon. The polyp was sessile.       The polyp was removed with a cold snare. Resection and retrieval were       complete.      Two sessile polyps were found in the transverse colon. The polyps were 6       to 8 mm in size. These polyps were removed with a hot snare. Resection       and retrieval were complete.      Colon otherwise normal; no other polyps, masses, vascular ectasias, or       inflammatory changes were seen.      The retroflexed view of the distal rectum and anal verge was normal and       showed no anal or rectal abnormalities. Impression:               - Preparation of the colon was fair.                           - Two 3 to 4 mm polyps in the  ascending colon and                            in the cecum, removed with a cold biopsy forceps.                            Resected and retrieved.                           - One 5 mm polyp in the transverse colon, removed                            with a cold snare. Resected and retrieved.                           - Two 6 to 8 mm polyps in the transverse colon,                            removed with a hot snare. Resected and retrieved.                           - The distal rectum and anal verge are normal on                            retroflexion view.                           - The examination was otherwise normal. Moderate Sedation:      None Recommendation:           - Patient has a contact number available for                            emergencies. The signs and symptoms of potential                            delayed complications were discussed with the  patient. Return to normal activities tomorrow.                            Written discharge instructions were provided to the                            patient.                           - Discharge patient to home (via wheelchair).                           - Resume previous diet indefinitely.                           - Continue present medications.                           - Await pathology results.                           - Repeat colonoscopy in 3 - 5 years for                            surveillance based on pathology results. Will again                            need two-day bowel preparation.                           - Return to GI clinic PRN.                           - Return to referring physician as previously                            scheduled. Procedure Code(s):        --- Professional ---                           [id number], Colonoscopy, flexible; with removal of                            tumor(s), polyp(s), or other lesion(s) by snare                            technique                            45380, 78, Colonoscopy, flexible; with biopsy,                            single or multiple Diagnosis Code(s):        --- Professional ---                           Z86.010, Personal history of colonic polyps  D12.2, Benign neoplasm of ascending colon                           D12.0, Benign neoplasm of cecum                           D12.3, Benign neoplasm of transverse colon (hepatic                            flexure or splenic flexure) CPT copyright 2016 American Medical Association. All rights reserved. The codes documented in this report are preliminary and upon coder review may  be revised to meet current compliance requirements. [name], MD [name], MD 09/27/2015 8:52:15 AM This report has been signed electronically. Number of Addenda: 0

## 2015-09-27 NOTE — Discharge Instructions (Signed)

## 2015-09-27 NOTE — Transfer of Care (Signed)
Immediate Anesthesia Transfer of Care Note  Patient: [name]  Procedure(s) Performed: Procedure(s): COLONOSCOPY WITH PROPOFOL (N/A)  Patient Location: PACU  Anesthesia Type:MAC  Level of Consciousness: sedated  Airway & Oxygen Therapy: Patient Spontanous Breathing and Patient connected to nasal cannula oxygen  Post-op Assessment: Post -op Vital signs reviewed and stable  Post vital signs: Reviewed and stable  Last Vitals:  Filed Vitals:   09/27/15 0718  BP: 168/81  Pulse: 78  Temp: 36.8 C  Resp: 25    Complications: No apparent anesthesia complications

## 2015-09-27 NOTE — H&P (Signed)
Patient interval history reviewed.  Patient examined again.  There has been no change from documented H/P dated 09/14/15 (scanned into chart from our office) except as documented above.  Assessment:  1.  Personal history of colonic polyps.  Plan:  1.  Colonoscopy. 2.  Risks (bleeding, infection, bowel perforation that could require surgery, sedation-related changes in cardiopulmonary systems), benefits (identification and possible treatment of source of symptoms, exclusion of certain causes of symptoms), and alternatives (watchful waiting, radiographic imaging studies, empiric medical treatment) of colonoscopy were explained to patient/family in detail and patient wishes to proceed.

## 2015-09-29 ENCOUNTER — Encounter (HOSPITAL_COMMUNITY): Payer: Self-pay | Admitting: Gastroenterology

## 2015-10-31 ENCOUNTER — Ambulatory Visit (INDEPENDENT_AMBULATORY_CARE_PROVIDER_SITE_OTHER): Payer: Medicaid Other

## 2015-10-31 ENCOUNTER — Encounter: Payer: Self-pay | Admitting: Podiatry

## 2015-10-31 ENCOUNTER — Ambulatory Visit (INDEPENDENT_AMBULATORY_CARE_PROVIDER_SITE_OTHER): Payer: Medicaid Other | Admitting: Podiatry

## 2015-10-31 VITALS — BP 151/86 | HR 87 | Resp 16

## 2015-10-31 DIAGNOSIS — M79609 Pain in unspecified limb: Secondary | ICD-10-CM

## 2015-10-31 DIAGNOSIS — E119 Type 2 diabetes mellitus without complications: Secondary | ICD-10-CM | POA: Diagnosis not present

## 2015-10-31 DIAGNOSIS — B351 Tinea unguium: Secondary | ICD-10-CM

## 2015-10-31 DIAGNOSIS — M79676 Pain in unspecified toe(s): Secondary | ICD-10-CM

## 2015-10-31 DIAGNOSIS — Q828 Other specified congenital malformations of skin: Secondary | ICD-10-CM

## 2015-10-31 NOTE — Progress Notes (Signed)
   Subjective:    Patient ID: [name], male    DOB: 24-Jun-1961, 55 y.o.   MRN: [id number]  HPI: He presents today with chief complaint of painful lesion plantar aspect of the right foot like to have a diabetic checkup.    Review of Systems  All other systems reviewed and are negative.      Objective:   Physical Exam: Vital signs are stable alert and oriented 3. Pulses are palpable. Left-sided paralysis is noted. Neurologic sensorium is diminished left side muscle strength is diminished less side other than that the right side is normal. Toenails are thick yellow dystrophic onychomycotic. Multiple porokeratotic lesions plantar aspect of the foot. No iatrogenic lesions no open wounds or infections.        Assessment & Plan:  Assessment: Diabetes mellitus not complicated. Porokeratosis bilateral. Pain in limb secondary to onychomycosis.  Plan: Debridement toenails 1 through 5 bilateral. Debridement of all reactive hyperkeratoses. Follow up with him in 3 months with Dr. Prudence Davidson.

## 2016-02-02 ENCOUNTER — Ambulatory Visit (INDEPENDENT_AMBULATORY_CARE_PROVIDER_SITE_OTHER): Payer: Medicaid Other | Admitting: Podiatry

## 2016-02-02 ENCOUNTER — Encounter: Payer: Self-pay | Admitting: Podiatry

## 2016-02-02 DIAGNOSIS — M79676 Pain in unspecified toe(s): Secondary | ICD-10-CM

## 2016-02-02 DIAGNOSIS — B351 Tinea unguium: Secondary | ICD-10-CM | POA: Diagnosis not present

## 2016-02-02 DIAGNOSIS — M79609 Pain in unspecified limb: Secondary | ICD-10-CM

## 2016-02-02 DIAGNOSIS — Q828 Other specified congenital malformations of skin: Secondary | ICD-10-CM

## 2016-02-02 DIAGNOSIS — E119 Type 2 diabetes mellitus without complications: Secondary | ICD-10-CM

## 2016-02-02 NOTE — Progress Notes (Signed)
Complaint:  Visit Type: Patient returns to my office for continued preventative foot care services. Complaint: Patient states" my nails have grown long and thick and become painful to walk and wear shoes" Patient has been diagnosed with DM with no foot complications. Patient also had CVA. The patient presents for preventative foot care services. No changes to ROS  Podiatric Exam: Vascular: dorsalis pedis and posterior tibial pulses are palpable bilateral. Capillary return is immediate. Temperature gradient is WNL. Skin turgor WNL  Sensorium: Normal Semmes Weinstein monofilament test. Normal tactile sensation bilaterally. Nail Exam: Pt has thick disfigured discolored nails with subungual debris noted bilateral entire nail hallux through fifth toenails Ulcer Exam: There is no evidence of ulcer or pre-ulcerative changes or infection. Orthopedic Exam: Muscle tone and strength are WNL. No limitations in general ROM. No crepitus or effusions noted. Foot type and digits show no abnormalities. Bony prominences are unremarkable. Skin: No Porokeratosis. No infection or ulcers.  Porokeratosis sub 5th metatarsal B/L.  Sub 5th metabase right foot.  Diagnosis:  Onychomycosis, , Pain in right toe, pain in left toes  Treatment & Plan Procedures and Treatment: Consent by patient was obtained for treatment procedures. The patient understood the discussion of treatment and procedures well. All questions were answered thoroughly reviewed. Debridement of mycotic and hypertrophic toenails, 1 through 5 bilateral and clearing of subungual debris. No ulceration, no infection noted.  Return Visit-Office Procedure: Patient instructed to return to the office for a follow up visit 3 months for continued evaluation and treatment.    Gardiner Barefoot DPM

## 2016-05-03 ENCOUNTER — Ambulatory Visit (INDEPENDENT_AMBULATORY_CARE_PROVIDER_SITE_OTHER): Payer: Medicaid Other | Admitting: Podiatry

## 2016-05-03 ENCOUNTER — Encounter: Payer: Self-pay | Admitting: Podiatry

## 2016-05-03 DIAGNOSIS — E119 Type 2 diabetes mellitus without complications: Secondary | ICD-10-CM | POA: Diagnosis not present

## 2016-05-03 DIAGNOSIS — M79676 Pain in unspecified toe(s): Secondary | ICD-10-CM | POA: Diagnosis not present

## 2016-05-03 DIAGNOSIS — M79609 Pain in unspecified limb: Secondary | ICD-10-CM

## 2016-05-03 DIAGNOSIS — B351 Tinea unguium: Secondary | ICD-10-CM

## 2016-05-03 DIAGNOSIS — Q828 Other specified congenital malformations of skin: Secondary | ICD-10-CM | POA: Diagnosis not present

## 2016-05-03 NOTE — Progress Notes (Signed)
Complaint:  Visit Type: Patient returns to my office for continued preventative foot care services. Complaint: Patient states" my nails have grown long and thick and become painful to walk and wear shoes" Patient has been diagnosed with DM with no foot complications. Patient also had CVA. The patient presents for preventative foot care services. No changes to ROS  Podiatric Exam: Vascular: dorsalis pedis and posterior tibial pulses are palpable bilateral. Capillary return is immediate. Temperature gradient is WNL. Skin turgor WNL  Sensorium: Normal Semmes Weinstein monofilament test. Normal tactile sensation bilaterally. Nail Exam: Pt has thick disfigured discolored nails with subungual debris noted bilateral entire nail hallux through fifth toenails Ulcer Exam: There is no evidence of ulcer or pre-ulcerative changes or infection. Orthopedic Exam: Muscle tone and strength are WNL. No limitations in general ROM. No crepitus or effusions noted. Foot type and digits show no abnormalities. Bony prominences are unremarkable. Skin: No Porokeratosis. No infection or ulcers.  Porokeratosis sub 5th metatarsal B/L.  Sub 5th metabase right foot.  Diagnosis:  Onychomycosis, , Pain in right toe, pain in left toes,  Porokeratosis  B/L  Treatment & Plan Procedures and Treatment: Consent by patient was obtained for treatment procedures. The patient understood the discussion of treatment and procedures well. All questions were answered thoroughly reviewed. Debridement of mycotic and hypertrophic toenails, 1 through 5 bilateral and clearing of subungual debris. No ulceration, no infection noted. Debride porokeratosis  B/L.  Patient was dispensed an old pair diabetic shoes. Return Visit-Office Procedure: Patient instructed to return to the office for a follow up visit 3 months for continued evaluation and treatment.    Gardiner Barefoot DPM

## 2016-07-12 ENCOUNTER — Ambulatory Visit: Payer: Medicaid Other | Admitting: Podiatry

## 2016-07-16 ENCOUNTER — Encounter: Payer: Self-pay | Admitting: Podiatry

## 2016-07-16 ENCOUNTER — Ambulatory Visit (INDEPENDENT_AMBULATORY_CARE_PROVIDER_SITE_OTHER): Payer: Medicaid Other | Admitting: Podiatry

## 2016-07-16 DIAGNOSIS — E119 Type 2 diabetes mellitus without complications: Secondary | ICD-10-CM

## 2016-07-16 DIAGNOSIS — B351 Tinea unguium: Secondary | ICD-10-CM | POA: Diagnosis not present

## 2016-07-16 DIAGNOSIS — Q828 Other specified congenital malformations of skin: Secondary | ICD-10-CM

## 2016-07-16 DIAGNOSIS — M79676 Pain in unspecified toe(s): Secondary | ICD-10-CM | POA: Diagnosis not present

## 2016-07-16 DIAGNOSIS — M79609 Pain in unspecified limb: Secondary | ICD-10-CM

## 2016-07-16 NOTE — Progress Notes (Signed)
Complaint:  Visit Type: Patient returns to my office for continued preventative foot care services. Complaint: Patient states" my nails have grown long and thick and become painful to walk and wear shoes" Patient has been diagnosed with DM with no foot complications. Patient also had CVA. The patient presents for preventative foot care services. No changes to ROS  Podiatric Exam: Vascular: dorsalis pedis and posterior tibial pulses are palpable bilateral. Capillary return is immediate. Temperature gradient is WNL. Skin turgor WNL  Sensorium: Normal Semmes Weinstein monofilament test. Normal tactile sensation bilaterally. Nail Exam: Pt has thick disfigured discolored nails with subungual debris noted bilateral entire nail hallux through fifth toenails Ulcer Exam: There is no evidence of ulcer or pre-ulcerative changes or infection. Orthopedic Exam: Muscle tone and strength are WNL. No limitations in general ROM. No crepitus or effusions noted. Foot type and digits show no abnormalities. Bony prominences are unremarkable. Skin: No Porokeratosis. No infection or ulcers.  Porokeratosis sub 5th metatarsal B/L.  Sub 5th metabase right foot.  Diagnosis:  Onychomycosis, , Pain in right toe, pain in left toes,  Porokeratosis  B/L  Treatment & Plan Procedures and Treatment: Consent by patient was obtained for treatment procedures. The patient understood the discussion of treatment and procedures well. All questions were answered thoroughly reviewed. Debridement of mycotic and hypertrophic toenails, 1 through 5 bilateral and clearing of subungual debris. No ulceration, no infection noted. Debride porokeratosis  B/L.   Return Visit-Office Procedure: Patient instructed to return to the office for a follow up visit 3 months for continued evaluation and treatment.    Gardiner Barefoot DPM

## 2016-09-27 ENCOUNTER — Ambulatory Visit: Payer: Medicaid Other | Admitting: Podiatry

## 2016-10-02 ENCOUNTER — Ambulatory Visit (INDEPENDENT_AMBULATORY_CARE_PROVIDER_SITE_OTHER): Payer: Medicaid Other | Admitting: Podiatry

## 2016-10-02 ENCOUNTER — Encounter: Payer: Self-pay | Admitting: Podiatry

## 2016-10-02 DIAGNOSIS — B351 Tinea unguium: Secondary | ICD-10-CM | POA: Diagnosis not present

## 2016-10-02 DIAGNOSIS — M79676 Pain in unspecified toe(s): Secondary | ICD-10-CM | POA: Diagnosis not present

## 2016-10-02 DIAGNOSIS — Q828 Other specified congenital malformations of skin: Secondary | ICD-10-CM

## 2016-10-02 DIAGNOSIS — M79609 Pain in unspecified limb: Principal | ICD-10-CM

## 2016-10-02 DIAGNOSIS — E119 Type 2 diabetes mellitus without complications: Secondary | ICD-10-CM

## 2016-10-02 NOTE — Progress Notes (Signed)
Complaint:  Visit Type: Patient returns to my office for continued preventative foot care services. Complaint: Patient states" my nails have grown long and thick and become painful to walk and wear shoes" Patient has been diagnosed with DM with no foot complications. Patient also had CVA. The patient presents for preventative foot care services. No changes to ROS  Podiatric Exam: Vascular: dorsalis pedis and posterior tibial pulses are palpable bilateral. Capillary return is immediate. Temperature gradient is WNL. Skin turgor WNL  Sensorium: Normal Semmes Weinstein monofilament test. Normal tactile sensation bilaterally. Nail Exam: Pt has thick disfigured discolored nails with subungual debris noted bilateral entire nail hallux through fifth toenails Ulcer Exam: There is no evidence of ulcer or pre-ulcerative changes or infection. Orthopedic Exam: Muscle tone and strength are WNL. No limitations in general ROM. No crepitus or effusions noted. Foot type and digits show no abnormalities. Bony prominences are unremarkable. Skin: No Porokeratosis. No infection or ulcers.  Porokeratosis sub 5th metatarsal B/L.  Sub 5th metabase right foot.  Diagnosis:  Onychomycosis, , Pain in right toe, pain in left toes,  Porokeratosis  B/L  Treatment & Plan Procedures and Treatment: Consent by patient was obtained for treatment procedures. The patient understood the discussion of treatment and procedures well. All questions were answered thoroughly reviewed. Debridement of mycotic and hypertrophic toenails, 1 through 5 bilateral and clearing of subungual debris. No ulceration, no infection noted. Debride porokeratosis  B/L.   Return Visit-Office Procedure: Patient instructed to return to the office for a follow up visit 10 weeks  for continued evaluation and treatment.    Gardiner Barefoot DPM

## 2016-11-27 ENCOUNTER — Ambulatory Visit (INDEPENDENT_AMBULATORY_CARE_PROVIDER_SITE_OTHER): Payer: Medicaid Other | Admitting: Podiatry

## 2016-11-27 ENCOUNTER — Encounter: Payer: Self-pay | Admitting: Podiatry

## 2016-11-27 DIAGNOSIS — Q828 Other specified congenital malformations of skin: Secondary | ICD-10-CM

## 2016-11-27 DIAGNOSIS — E119 Type 2 diabetes mellitus without complications: Secondary | ICD-10-CM

## 2016-11-27 NOTE — Progress Notes (Signed)
Complaint:  Visit Type: Patient returns to my office for continued preventative foot care services.  He says the callus on the outside of his right foot is extremely painful and needed to present to the office earlier than scheduled.  He says the right foot is painful walking due to the painful callus on the bottom of his right foot. He presents the office today for continued evaluation and treatment of this painful area  Podiatric Exam: Vascular: dorsalis pedis and posterior tibial pulses are palpable bilateral. Capillary return is immediate. Temperature gradient is WNL. Skin turgor WNL  Sensorium: Normal Semmes Weinstein monofilament test. Normal tactile sensation bilaterally. Nail Exam: Pt has thick disfigured discolored nails with subungual debris noted bilateral entire nail hallux through fifth toenails Ulcer Exam: There is no evidence of ulcer or pre-ulcerative changes or infection. Orthopedic Exam: Muscle tone and strength are WNL. No limitations in general ROM. No crepitus or effusions noted. Foot type and digits show no abnormalities. Bony prominences are unremarkable. Skin:  No infection or ulcers.  Porokeratosis sub 5th metatarsal B/L.  Sub 5th metabase right foot.  Diagnosis:   Porokeratosis  B/L  Treatment & Plan Procedures and Treatment: Consent by patient was obtained for treatment procedures. The patient understood the discussion of treatment and procedures well. All questions were answered thoroughly reviewed. Debride porokeratosis  B/L.  Padding applied to diabetic insole. Return Visit-Office Procedure: Patient instructed to return to the office for a follow up visit 3 months for continued evaluation and treatment.    Gardiner Barefoot DPM

## 2016-12-13 ENCOUNTER — Ambulatory Visit: Payer: Medicaid Other | Admitting: Podiatry

## 2017-02-07 ENCOUNTER — Ambulatory Visit (INDEPENDENT_AMBULATORY_CARE_PROVIDER_SITE_OTHER): Payer: Medicaid Other | Admitting: Podiatry

## 2017-02-07 DIAGNOSIS — Q828 Other specified congenital malformations of skin: Secondary | ICD-10-CM

## 2017-02-07 DIAGNOSIS — E119 Type 2 diabetes mellitus without complications: Secondary | ICD-10-CM | POA: Diagnosis not present

## 2017-02-07 NOTE — Progress Notes (Signed)
Complaint:  Visit Type: Patient returns to my office for continued preventative foot care services.  He says the callus on the outside right foot is extremely painful.  He presents for treatment of his callus.  Podiatric Exam: Vascular: dorsalis pedis and posterior tibial pulses are palpable bilateral. Capillary return is immediate. Temperature gradient is WNL. Skin turgor WNL  Sensorium: Normal Semmes Weinstein monofilament test. Normal tactile sensation bilaterally. Nail Exam: Pt has thick disfigured discolored nails with subungual debris noted bilateral entire nail hallux through fifth toenails Ulcer Exam: There is no evidence of ulcer or pre-ulcerative changes or infection. Orthopedic Exam: Muscle tone and strength are WNL. No limitations in general ROM. No crepitus or effusions noted. Foot type and digits show no abnormalities. Bony prominences are unremarkable. Skin:  No infection or ulcers.  Porokeratosis sub 5th metatarsal B/L.  Sub 5th metabase right foot.  Diagnosis:   Porokeratosis  B/L  Treatment & Plan Procedures and Treatment: Consent by patient was obtained for treatment procedures. The patient understood the discussion of treatment and procedures well. All questions were answered thoroughly reviewed. Debride porokeratosis  B/L.  Return Visit-Office Procedure: Patient instructed to return to the office for a follow up visit 9 weeks  for continued evaluation and treatment.    Gardiner Barefoot DPM

## 2017-04-18 ENCOUNTER — Ambulatory Visit (INDEPENDENT_AMBULATORY_CARE_PROVIDER_SITE_OTHER): Payer: Medicaid Other | Admitting: Podiatry

## 2017-04-18 ENCOUNTER — Encounter: Payer: Self-pay | Admitting: Podiatry

## 2017-04-18 DIAGNOSIS — E119 Type 2 diabetes mellitus without complications: Secondary | ICD-10-CM

## 2017-04-18 DIAGNOSIS — Q828 Other specified congenital malformations of skin: Secondary | ICD-10-CM

## 2017-04-18 DIAGNOSIS — L989 Disorder of the skin and subcutaneous tissue, unspecified: Secondary | ICD-10-CM

## 2017-04-18 NOTE — Progress Notes (Signed)
Complaint:  Visit Type: Patient returns to my office for continued preventative foot care services.  He says the callus on the outside right foot is extremely painful.  He presents for treatment of his callus.  Podiatric Exam: Vascular: dorsalis pedis and posterior tibial pulses are palpable bilateral. Capillary return is immediate. Temperature gradient is WNL. Skin turgor WNL  Sensorium: Normal Semmes Weinstein monofilament test. Normal tactile sensation bilaterally. Nail Exam: Pt has thick disfigured discolored nails with subungual debris noted bilateral 3-5  B/L. Ulcer Exam: There is no evidence of ulcer or pre-ulcerative changes or infection. Orthopedic Exam: Muscle tone and strength are WNL. No limitations in general ROM. No crepitus or effusions noted. Foot type and digits show no abnormalities. Bony prominences are unremarkable. Skin:  No infection or ulcers.  Porokeratosis sub 5th metatarsal B/L.  Sub 5th metabase right foot. Pinch callus.  Diagnosis:   Porokeratosis  B/L  Treatment & Plan Procedures and Treatment: Consent by patient was obtained for treatment procedures. The patient understood the discussion of treatment and procedures well. All questions were answered thoroughly reviewed. Debride porokeratosis  B/L.  Return Visit-Office Procedure: Patient instructed to return to the office for a follow up visit 9 weeks  for continued evaluation and treatment.    Gardiner Barefoot DPM

## 2017-06-27 ENCOUNTER — Encounter: Payer: Self-pay | Admitting: Podiatry

## 2017-06-27 ENCOUNTER — Ambulatory Visit: Payer: Medicaid Other | Admitting: Podiatry

## 2017-06-27 DIAGNOSIS — Q828 Other specified congenital malformations of skin: Secondary | ICD-10-CM

## 2017-06-27 DIAGNOSIS — M79676 Pain in unspecified toe(s): Secondary | ICD-10-CM

## 2017-06-27 DIAGNOSIS — B351 Tinea unguium: Secondary | ICD-10-CM

## 2017-06-27 DIAGNOSIS — M79674 Pain in right toe(s): Secondary | ICD-10-CM

## 2017-06-27 DIAGNOSIS — E119 Type 2 diabetes mellitus without complications: Secondary | ICD-10-CM

## 2017-06-27 DIAGNOSIS — L989 Disorder of the skin and subcutaneous tissue, unspecified: Secondary | ICD-10-CM

## 2017-06-27 DIAGNOSIS — M79675 Pain in left toe(s): Secondary | ICD-10-CM

## 2017-06-27 NOTE — Progress Notes (Addendum)
Complaint:  Visit Type: Patient returns to my office for continued preventative foot care services.  He says the callus on the outside right foot is extremely painful.  He presents for treatment of his callus.  He says his nails have grown long and are catching on his socks.  Podiatric Exam: Vascular: dorsalis pedis and posterior tibial pulses are palpable bilateral. Capillary return is immediate. Temperature gradient is WNL. Skin turgor WNL  Sensorium: Normal Semmes Weinstein monofilament test. Normal tactile sensation bilaterally. Nail Exam: Pt has thick disfigured discolored nails with subungual debris noted bilateral 3-5  B/L. Ulcer Exam: There is no evidence of ulcer or pre-ulcerative changes or infection. Orthopedic Exam: Muscle tone and strength are WNL. No limitations in general ROM. No crepitus or effusions noted. Foot type and digits show no abnormalities. Bony prominences are unremarkable. Skin:  No infection or ulcers.  Porokeratosis sub 5th metatarsal B/L.  Sub 5th metabase right foot. Pinch callus.  Diagnosis:   Porokeratosis  B/L  Onychomycosis  B/L Treatment & Plan Procedures and Treatment: Consent by patient was obtained for treatment procedures. The patient understood the discussion of treatment and procedures well. All questions were answered thoroughly reviewed. Debride porokeratosis  B/L. Debride nails.Injection therapy.  Injection therapy using 0.5 cc. Of 2% xylocaine( 20 mg.) plus 0.5 of kenalog-la ( 10 mg) plus 1/2 cc. of dexamethazone phosphate ( 2 mg) Return Visit-Office Procedure: Patient instructed to return to the office for a follow up visit 9 weeks  for continued evaluation and treatment.    Gardiner Barefoot DPM

## 2017-09-05 ENCOUNTER — Encounter: Payer: Self-pay | Admitting: Podiatry

## 2017-09-05 ENCOUNTER — Ambulatory Visit: Payer: Medicaid Other | Admitting: Podiatry

## 2017-09-05 DIAGNOSIS — Q828 Other specified congenital malformations of skin: Secondary | ICD-10-CM | POA: Diagnosis not present

## 2017-09-05 DIAGNOSIS — E119 Type 2 diabetes mellitus without complications: Secondary | ICD-10-CM | POA: Diagnosis not present

## 2017-09-05 DIAGNOSIS — B351 Tinea unguium: Secondary | ICD-10-CM | POA: Diagnosis not present

## 2017-09-05 DIAGNOSIS — M79674 Pain in right toe(s): Secondary | ICD-10-CM | POA: Diagnosis not present

## 2017-09-05 DIAGNOSIS — L989 Disorder of the skin and subcutaneous tissue, unspecified: Secondary | ICD-10-CM

## 2017-09-05 DIAGNOSIS — M79675 Pain in left toe(s): Secondary | ICD-10-CM

## 2017-09-05 NOTE — Progress Notes (Signed)
Complaint:  Visit Type: Patient returns to my office for continued preventative foot care services. He says the callus on the outside of right foot is painful.  Long thick painful nails.  Podiatric Exam: Vascular: dorsalis pedis and posterior tibial pulses are palpable bilateral. Capillary return is immediate. Temperature gradient is WNL. Skin turgor WNL  Sensorium: Normal Semmes Weinstein monofilament test. Normal tactile sensation bilaterally. Nail Exam: Pt has thick disfigured discolored nails with subungual debris noted bilateral 3-5  B/L. Ulcer Exam: There is no evidence of ulcer or pre-ulcerative changes or infection. Orthopedic Exam: Muscle tone and strength are WNL. No limitations in general ROM. No crepitus or effusions noted. Foot type and digits show no abnormalities. Bony prominences are unremarkable. Skin:  No infection or ulcers.  Porokeratosis sub 5th metatarsal B/L.  Sub 5th metabase right foot. Pinch callus.  Diagnosis:   Porokeratosis  B/L  Onychomycosis  B/L Treatment & Plan Procedures and Treatment: Consent by patient was obtained for treatment procedures. The patient understood the discussion of treatment and procedures well. All questions were answered thoroughly reviewed. Debride porokeratosis  B/L. Debride nails. Return Visit-Office Procedure: Patient instructed to return to the office for a follow up visit 9 weeks  for continued evaluation and treatment.    Gardiner Barefoot DPM

## 2017-11-07 ENCOUNTER — Ambulatory Visit: Payer: Medicaid Other | Admitting: Podiatry

## 2017-11-07 ENCOUNTER — Encounter: Payer: Self-pay | Admitting: Podiatry

## 2017-11-07 DIAGNOSIS — E1149 Type 2 diabetes mellitus with other diabetic neurological complication: Secondary | ICD-10-CM | POA: Diagnosis not present

## 2017-11-07 DIAGNOSIS — M79674 Pain in right toe(s): Secondary | ICD-10-CM

## 2017-11-07 DIAGNOSIS — L97521 Non-pressure chronic ulcer of other part of left foot limited to breakdown of skin: Secondary | ICD-10-CM | POA: Diagnosis not present

## 2017-11-07 DIAGNOSIS — M79675 Pain in left toe(s): Secondary | ICD-10-CM

## 2017-11-07 DIAGNOSIS — B351 Tinea unguium: Secondary | ICD-10-CM | POA: Diagnosis not present

## 2017-11-07 DIAGNOSIS — Q828 Other specified congenital malformations of skin: Secondary | ICD-10-CM

## 2017-11-07 DIAGNOSIS — E119 Type 2 diabetes mellitus without complications: Secondary | ICD-10-CM

## 2017-11-07 NOTE — Progress Notes (Signed)
Complaint:  Visit Type: Patient returns to my office for continued preventative foot care services. He says the callus on the outside of right foot is painful.  Long thick painful nails.  Podiatric Exam: Vascular: dorsalis pedis and posterior tibial pulses are palpable bilateral. Capillary return is immediate. Temperature gradient is WNL. Skin turgor WNL  Sensorium: Normal Semmes Weinstein monofilament test. Normal tactile sensation bilaterally. Nail Exam: Pt has thick disfigured discolored nails with subungual debris noted bilateral 3-5  B/L. Ulcer Exam: There is no evidence of ulcer or pre-ulcerative changes or infection. Orthopedic Exam: Muscle tone and strength are WNL. No limitations in general ROM. No crepitus or effusions noted. Foot type and digits show no abnormalities. Bony prominences are unremarkable. Skin:  No infection or ulcers.  Porokeratosis sub 5th metatarsal B/L.  Sub 5th metabase right foot. Pinch callus.  Diagnosis:   Porokeratosis  B/L  Onychomycosis  B/L Treatment & Plan Procedures and Treatment: Consent by patient was obtained for treatment procedures. The patient understood the discussion of treatment and procedures well. All questions were answered thoroughly reviewed. Debride porokeratosis  B/L. Debride nails. Return Visit-Office Procedure: Patient instructed to return to the office for a follow up visit 9 weeks  for continued evaluation and treatment.    Gardiner Barefoot DPM

## 2018-01-09 ENCOUNTER — Ambulatory Visit: Payer: Medicaid Other | Admitting: Podiatry

## 2018-01-09 ENCOUNTER — Encounter: Payer: Self-pay | Admitting: Podiatry

## 2018-01-09 DIAGNOSIS — Q828 Other specified congenital malformations of skin: Secondary | ICD-10-CM

## 2018-01-09 DIAGNOSIS — M79675 Pain in left toe(s): Principal | ICD-10-CM

## 2018-01-09 DIAGNOSIS — M79676 Pain in unspecified toe(s): Secondary | ICD-10-CM | POA: Diagnosis not present

## 2018-01-09 DIAGNOSIS — B351 Tinea unguium: Secondary | ICD-10-CM | POA: Diagnosis not present

## 2018-01-09 DIAGNOSIS — M79674 Pain in right toe(s): Principal | ICD-10-CM

## 2018-01-09 NOTE — Progress Notes (Signed)
Subjective: [name] presents today with painful toenails b/l feet, which interfere with activities of daily living. Pain is relieved with periodic professional debridement.  He also presents today with cc of painful porokeratotic lesions submet head 5 b/l and plantarlateral 5th metatarsal base right foot. Most symptomatic is the porokeratosis on the plantarlateral aspect of the right 5th metatarsal base. He relates tenderness to touch and pain when wearing any type of shoe gear. He denies any swelling, drainage, or redness to area. Pain is relieved with periodic, professional debridement.  2019 ABN signed on today.  Objective: There were no vitals filed for this visit. Vascular Examination: Capillary refill time is immediate. Dorsalis pedis and posterior tibial pulses present b/l. No digital hair x 10 digits. Skin temperature gradient is WNL.  Dermatological Examination: Skin with normal turgor, texture and tone Toenails are thick, discolored, dystrophic with subungual debris toenails 3-5 b/l. 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 Nailbeds are intact. Porokeratotic lesion submetatarsal head 5 b/l, and plantarlateral aspect right 5th metatarsal base.   Musculoskeletal: Muscle strength 5/5 to all LE muscle groups  Neurological: Sensation intact with 10 gram monofilament. Vibratory sensation intact.  Assessment: 1. Painful onychomycosis toenails 3-5 b/l; total 6 toenails. 2. Painful porokeratosis submetatarsal head 5 b/l, and plantarlateral aspect right 5th metatarsal base.  Plan: 1. 2019 ABN signed today. 2. Toenails 3-5 b/l were debrided in length and girth. 3. Porokeratoses debrided x 3. 4. Patient to continue soft, supportive shoe gear 5. Patient to report any pedal injuries to medical professional immediately. 6. Follow up 3 months.  7. [name] to call should there be a concern in the interim.

## 2018-04-17 ENCOUNTER — Ambulatory Visit: Payer: Medicaid Other | Admitting: Podiatry

## 2018-04-17 ENCOUNTER — Encounter: Payer: Self-pay | Admitting: Podiatry

## 2018-04-17 DIAGNOSIS — Q828 Other specified congenital malformations of skin: Secondary | ICD-10-CM

## 2018-04-17 DIAGNOSIS — M79675 Pain in left toe(s): Secondary | ICD-10-CM

## 2018-04-17 DIAGNOSIS — M79674 Pain in right toe(s): Secondary | ICD-10-CM

## 2018-04-17 DIAGNOSIS — B351 Tinea unguium: Secondary | ICD-10-CM

## 2018-04-19 NOTE — Progress Notes (Signed)
Subjective: [name] presents today for preventative foot care. He has cc of painful, discolored, thick toenails which interfere with daily activities and routine tasks. He also has cc of painful porokeratoses b/l feet right > left. He states the lesion under his 5th metatarsal base of the right foot is more painful than the others on today. He denies any redness, swelling or drainage on today.  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 Painful porokeratotic lesion x 3:  submetatarsal head 5 b/l and plantarlateral aspect right 5th metatarsal base 2. Onychomycosis 3-5 b/l    Plan: 1. Toenails 3-5 b/l were debrided in length and girth. 2. Porokeratoses debrided x 3. Pt noted relief post-debridement 3.   Patient to continue soft, supportive shoe gear 4.    Patient to report any pedal injuries to medical professional immediately. 5.   Follow up 3 months.Patient/POA to call should there be a concern in the interim.

## 2018-04-23 ENCOUNTER — Emergency Department (HOSPITAL_COMMUNITY): Payer: Medicaid Other

## 2018-04-23 ENCOUNTER — Other Ambulatory Visit: Payer: Self-pay

## 2018-04-23 ENCOUNTER — Emergency Department (HOSPITAL_COMMUNITY)
Admission: EM | Admit: 2018-04-23 | Discharge: 2018-04-23 | Disposition: A | Discharge status: Home / Self Care | Payer: Medicaid Other | Attending: Emergency Medicine | Admitting: Emergency Medicine

## 2018-04-23 ENCOUNTER — Encounter (HOSPITAL_COMMUNITY): Payer: Self-pay

## 2018-04-23 DIAGNOSIS — I10 Essential (primary) hypertension: Secondary | ICD-10-CM | POA: Diagnosis not present

## 2018-04-23 DIAGNOSIS — F17210 Nicotine dependence, cigarettes, uncomplicated: Secondary | ICD-10-CM | POA: Diagnosis not present

## 2018-04-23 DIAGNOSIS — R0789 Other chest pain: Secondary | ICD-10-CM | POA: Diagnosis present

## 2018-04-23 DIAGNOSIS — R0602 Shortness of breath: Secondary | ICD-10-CM | POA: Diagnosis not present

## 2018-04-23 MED ORDER — IPRATROPIUM-ALBUTEROL 0.5-2.5 (3) MG/3ML IN SOLN
3.0000 mL | Freq: Once | RESPIRATORY_TRACT | Status: AC
  Administered 2018-04-23: 3 mL via RESPIRATORY_TRACT
  Filled 2018-04-23: qty 3

## 2018-04-23 MED ORDER — HYDROCODONE-ACETAMINOPHEN 5-325 MG PO TABS: 1.0000 | ORAL_TABLET | Freq: Four times a day (QID) | ORAL | 0 refills | Status: DC | PRN

## 2018-04-23 NOTE — ED Provider Notes (Signed)
[facility] DEPT Provider Note   CSN: [id number] Arrival date & time: 04/23/18  1638     History   Chief Complaint Chief Complaint  Patient presents with  . Shortness of Breath    HPI [name] is a 57 y.o. male.  He is complaining of some left-sided rib and back pain that is been going on for 2 days.  He says he has increased short of breath but really he has pain if he takes a deep breath or any kind of twisting turning motion.  He does not recall any trauma.  He said the pain was so bad tonight that he decided he needed to come to the emergency room for evaluation.  He does have a history of some pulmonary process for which he uses [facility].  He has had a prior stroke with residual deficits on his left side and is also blind in his left eye.  He has not had any cough with any particular sputum no fever no vomiting or diarrhea.  The history is provided by the patient.  Shortness of Breath  This is a new problem. The average episode lasts 2 days. The current episode started 2 days ago. The problem has not changed since onset.Associated symptoms include chest pain. Pertinent negatives include no fever, no headaches, no sore throat, no neck pain, no cough, no sputum production, no hemoptysis, no vomiting, no abdominal pain, no rash and no leg pain. It is unknown what precipitated the problem. He has tried nothing for the symptoms. The treatment provided no relief.    Past Medical History:  Diagnosis Date  . Arthritis   . 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 COPD (chronic obstructive pulmonary disease) ([place])   . Difficulty sleeping   . GERD (gastroesophageal reflux disease)   . High cholesterol   . History of gout   . Hypertension   . Recurrent kidney stones   . Stroke ([place]) sept 26,  20006    hx mini strokes in past, 1 large stroke  . Weakness of one side of body    L sided weakness after stroke     There are no active problems to display for this patient.   Past Surgical History:  Procedure Laterality Date  . COLONOSCOPY WITH PROPOFOL N/A 09/27/2015   Procedure: COLONOSCOPY WITH PROPOFOL;  Surgeon: [name], MD;  Location: [facility];  Service: Endoscopy;  Laterality: N/A;  . CYSTOSCOPY N/A 10/05/2012   Procedure: CYSTOSCOPY, retrograde and right stent placement;  Surgeon: [name], MD;  Location: [facility];  Service: Urology;  Laterality: N/A;  . CYSTOSCOPY WITH RETROGRADE PYELOGRAM, URETEROSCOPY AND STENT PLACEMENT Right 04/13/2014   Procedure: CYSTOSCOPY WITH RETROGRADE PYELOGRAM, URETEROSCOPY AND STENT PLACEMENT, LASER ABLATION OF SCAR TISSUE;  Surgeon: [name], MD;  Location: [facility];  Service: Urology;  Laterality: Right;  . HOLMIUM LASER APPLICATION Right 67/06/4579   Procedure: HOLMIUM LASER APPLICATION;  Surgeon: [name], MD;  Location: [facility];  Service: Urology;  Laterality: Right;  . left ey surgery  age 53  . NEPHROLITHOTOMY Right 10/05/2012   Procedure: NEPHROLITHOTOMY PERCUTANEOUS;  Surgeon: [name], MD;  Location: [facility];  Service: Urology;  Laterality: Right;  WITH CYSTO AND SURGEON ACCESS   . NEPHROLITHOTOMY Right 10/07/2012   Procedure: NEPHROLITHOTOMY PERCUTANEOUS SECOND LOOK. Stent exchange,Ureteroscopy ;  Surgeon: [name], MD;  Location: [facility];  Service: Urology;  Laterality: Right;  .  TOENAIL EXCISION          Home Medications    Prior to Admission medications   Medication Sig Start Date End Date Taking? Authorizing Provider  albuterol (PROVENTIL HFA;VENTOLIN HFA) 108 (90 BASE) MCG/ACT inhaler Inhale 1-2 puffs into the lungs every 6 (six) hours as needed for wheezing or shortness of breath. Reported on 07/31/2015   Yes [provider]  allopurinol (ZYLOPRIM) 300 MG tablet Take 300 mg by mouth every morning.    Yes [provider]  aspirin EC 81 MG tablet Take 81 mg by mouth every morning.   Yes [provider]  baclofen (LIORESAL) 20 MG tablet Take 10 mg by mouth 3 (three) times daily.    Yes [provider]  budesonide-formoterol (SYMBICORT) 160-4.5 MCG/ACT inhaler Inhale 2 puffs into the lungs 2 (two) times daily.   Yes [provider]  cephALEXin (KEFLEX) 500 MG capsule Take 500 mg by mouth 2 (two) times daily.   Yes [provider]  cetirizine (ZYRTEC) 10 MG tablet Take 10 mg by mouth every morning. Reported on 07/31/2015   Yes [provider]  furosemide (LASIX) 20 MG tablet Take 20 mg by mouth daily at 10 pm. Reported on 07/31/2015   Yes [provider]  hydrOXYzine (ATARAX/VISTARIL) 25 MG tablet Take 25 mg by mouth 3 (three) times daily as needed.   Yes [provider]  losartan-hydrochlorothiazide (HYZAAR) 100-12.5 MG tablet Take 1 tablet by mouth daily.   Yes [provider]  Multiple Vitamin (MULTIVITAMIN WITH MINERALS) TABS tablet Take 1 tablet by mouth every morning.   Yes [provider]  pantoprazole (PROTONIX) 40 MG tablet Take 40 mg by mouth daily.   Yes [provider]  potassium citrate (UROCIT-K) 10 MEQ (1080 MG) SR tablet Take 10 mEq by mouth 2 (two) times daily at 10 AM and 5 PM. Reported on 07/31/2015   Yes [provider]  simvastatin (ZOCOR) 20 MG tablet Take 20 mg by mouth at bedtime.   Yes [provider]  traMADol (ULTRAM) 50 MG tablet Take 50 mg by mouth every 6 (six) hours as needed.   Yes [provider]  traZODone (DESYREL) 50 MG tablet Take 50 mg by mouth at bedtime.   Yes [provider]    Family History History reviewed. No pertinent family history.  Social History Social History   Tobacco Use  . Smoking status: Current Every Day Smoker    Packs/day: 0.50    Years: 15.00    Pack years: 7.50    Types: Cigarettes  . Smokeless tobacco: Never Used  Substance Use Topics  . Alcohol use: No  . Drug use: No     Allergies   Patient  has no known allergies.   Review of Systems Review of Systems  Constitutional: Negative for fever.  HENT: Negative for sore throat.   Eyes: Negative for visual disturbance.  Respiratory: Positive for shortness of breath. Negative for cough, hemoptysis and sputum production.   Cardiovascular: Positive for chest pain.  Gastrointestinal: Negative for abdominal pain, nausea and vomiting.  Genitourinary: Negative for dysuria.  Musculoskeletal: Positive for back pain. Negative for neck pain.  Skin: Negative for rash.  Neurological: Negative for headaches.     Physical Exam Updated Vital Signs BP 139/89 (BP Location: Right Arm)   Pulse 85   Temp 97.9 F (36.6 C) (Oral)   Resp 18   Ht 5' 5.5" (1.664 m)   Wt 98.9 kg  SpO2 98%   BMI 35.73 kg/m   Physical Exam  Constitutional: He appears well-developed and well-nourished.  HENT:  Head: Normocephalic and atraumatic.  Eyes: Conjunctivae are normal.  Cloudy cornea on the left.  Neck: Neck supple.  Cardiovascular: Normal rate, regular rhythm and normal heart sounds.  Pulmonary/Chest: Effort normal and breath sounds normal. No respiratory distress. He exhibits tenderness (left lateral). He exhibits no crepitus.  Abdominal: Soft. He exhibits no ascites and no mass. There is no tenderness.  Musculoskeletal:       Right lower leg: He exhibits no tenderness.       Left lower leg: He exhibits no tenderness.  Neurological: He is alert. GCS eye subscore is 4. GCS verbal subscore is 5. GCS motor subscore is 6.  Skin: Skin is warm and dry.  Psychiatric: He has a normal mood and affect.  Nursing note and vitals reviewed.    ED Treatments / Results  Labs (all labs ordered are listed, but only abnormal results are displayed) Labs Reviewed - No data to display  EKG None  Radiology Dg Chest 2 View  Result Date: 04/23/2018 CLINICAL DATA:  Acute shortness of breath and LEFT chest pain for 2 days. EXAM: CHEST - 2 VIEW COMPARISON:   04/07/2014 chest radiograph FINDINGS: The cardiomediastinal silhouette is unchanged. A severe thoracolumbar scoliosis is again noted. Interstitial prominence/peribronchial thickening again noted. There is no evidence of focal airspace disease, pulmonary edema, suspicious pulmonary nodule/mass, pleural effusion, or pneumothorax. No acute bony abnormalities are identified. IMPRESSION: No evidence of acute cardiopulmonary disease. Electronically Signed   By: Margarette Canada M.D.   On: 04/23/2018 17:55    Procedures Procedures (including critical care time)  Medications Ordered in ED Medications  ipratropium-albuterol (DUONEB) 0.5-2.5 (3) MG/3ML nebulizer solution 3 mL (3 mLs Nebulization Given 04/23/18 2144)     Initial Impression / Assessment and Plan / ED Course  I have reviewed the triage vital signs and the nursing notes.  Pertinent labs & imaging results that were available during my care of the patient were reviewed by me and considered in my medical decision making (see chart for details).  Clinical Course as of Apr 23 2322  Thu Apr 23, 2018  2211 She has clear chest wall reproducible tenderness on his left side.  Chest x-ray did not show any obvious fractures or pneumothorax.  He is asking for something for pain to take at night and I think is reasonable that we place him on a short course of some pain medicine.  He is on chronic tramadol and he says that is not touching his pain.   [MB]  2234 EKG normal sinus rhythm rate of 83 normal intervals no acute ST-T changes.   [MB]    Clinical Course User Index [MB] Hayden Rasmussen, MD      Final Clinical Impressions(s) / ED Diagnoses   Final diagnoses:  Chest wall pain    ED Discharge Orders         Ordered    HYDROcodone-acetaminophen (NORCO/VICODIN) 5-325 MG tablet  Every 6 hours PRN     04/23/18 2225           Hayden Rasmussen, MD 04/23/18 2324

## 2018-04-23 NOTE — Discharge Instructions (Signed)
Your evaluated in the emergency department for pain on the left side of your chest.  This seemed to be involving her left chest wall as the pain was worse with any kind of turning or twisting or coughing motion.  Your chest x-ray did not show an obvious problem.  We are prescribing some pain medicine but I will be important for you to follow-up with your regular doctor for further evaluation.  Please return if any worsening symptoms.

## 2018-04-23 NOTE — ED Triage Notes (Signed)
Patient c/o increased SOB, left rib cage pain, and left mid back pain x 2 days.

## 2018-07-24 ENCOUNTER — Ambulatory Visit: Payer: Medicaid Other | Admitting: Podiatry

## 2018-07-24 ENCOUNTER — Encounter: Payer: Self-pay | Admitting: Podiatry

## 2018-07-24 DIAGNOSIS — Q828 Other specified congenital malformations of skin: Secondary | ICD-10-CM

## 2018-07-24 DIAGNOSIS — M79675 Pain in left toe(s): Secondary | ICD-10-CM | POA: Diagnosis not present

## 2018-07-24 DIAGNOSIS — M79674 Pain in right toe(s): Secondary | ICD-10-CM | POA: Diagnosis not present

## 2018-07-24 DIAGNOSIS — B351 Tinea unguium: Secondary | ICD-10-CM

## 2018-07-24 NOTE — Patient Instructions (Signed)
Onychomycosis/Fungal Toenails  WHAT IS IT? An infection that lies within the keratin of your nail plate that is caused by a fungus.  WHY ME? Fungal infections affect all ages, sexes, races, and creeds.  There may be many factors that predispose you to a fungal infection such as age, coexisting medical conditions such as diabetes, or an autoimmune disease; stress, medications, fatigue, genetics, etc.  Bottom line: fungus thrives in a warm, moist environment and your shoes offer such a location.  IS IT CONTAGIOUS? Theoretically, yes.  You do not want to share shoes, nail clippers or files with someone who has fungal toenails.  Walking around barefoot in the same room or sleeping in the same bed is unlikely to transfer the organism.  It is important to realize, however, that fungus can spread easily from one nail to the next on the same foot.  HOW DO WE TREAT THIS?  There are several ways to treat this condition.  Treatment may depend on many factors such as age, medications, pregnancy, liver and kidney conditions, etc.  It is best to ask your doctor which options are available to you.  1. No treatment.   Unlike many other medical concerns, you can live with this condition.  However for many people this can be a painful condition and may lead to ingrown toenails or a bacterial infection.  It is recommended that you keep the nails cut short to help reduce the amount of fungal nail. 2. Topical treatment.  These range from herbal remedies to prescription strength nail lacquers.  About 40-50% effective, topicals require twice daily application for approximately 9 to 12 months or until an entirely new nail has grown out.  The most effective topicals are medical grade medications available through physicians offices. 3. Oral antifungal medications.  With an 80-90% cure rate, the most common oral medication requires 3 to 4 months of therapy and stays in your system for a year as the new nail grows out.  Oral  antifungal medications do require blood work to make sure it is a safe drug for you.  A liver function panel will be performed prior to starting the medication and after the first month of treatment.  It is important to have the blood work performed to avoid any harmful side effects.  In general, this medication safe but blood work is required. 4. Laser Therapy.  This treatment is performed by applying a specialized laser to the affected nail plate.  This therapy is noninvasive, fast, and non-painful.  It is not covered by insurance and is therefore, out of pocket.  The results have been very good with a 80-95% cure rate.  The [facility] is the only practice in the area to offer this therapy. 5. Permanent Nail Avulsion.  Removing the entire nail so that a new nail will not grow back.  Corns and Calluses Corns are small areas of thickened skin that occur on the top, sides, or tip of a toe. They contain a cone-shaped core with a point that can press on a nerve below. This causes pain.  Calluses are areas of thickened skin that can occur anywhere on the body, including the hands, fingers, palms, soles of the feet, and heels. Calluses are usually larger than corns. What are the causes? Corns and calluses are caused by rubbing (friction) or pressure, such as from shoes that are too tight or do not fit properly. What increases the risk? Corns are more likely to develop in people   who have misshapen toes (toe deformities), such as hammer toes. Calluses can occur with friction to any area of the skin. They are more likely to develop in people who:  Work with their hands.  Wear shoes that fit poorly, are too tight, or are high-heeled.  Have toe deformities. What are the signs or symptoms? Symptoms of a corn or callus include:  A hard growth on the skin.  Pain or tenderness under the skin.  Redness and swelling.  Increased discomfort while wearing tight-fitting shoes, if your feet are  affected. If a corn or callus becomes infected, symptoms may include:  Redness and swelling that gets worse.  Pain.  Fluid, blood, or pus draining from the corn or callus. How is this diagnosed? Corns and calluses may be diagnosed based on your symptoms, your medical history, and a physical exam. How is this treated? Treatment for corns and calluses may include:  Removing the cause of the friction or pressure. This may involve: ? Changing your shoes. ? Wearing shoe inserts (orthotics) or other protective layers in your shoes, such as a corn pad. ? Wearing gloves.  Applying medicine to the skin (topical medicine) to help soften skin in the hardened, thickened areas.  Removing layers of dead skin with a file to reduce the size of the corn or callus.  Removing the corn or callus with a scalpel or laser.  Taking antibiotic medicines, if your corn or callus is infected.  Having surgery, if a toe deformity is the cause. Follow these instructions at home:   Take over-the-counter and prescription medicines only as told by your health care provider.  If you were prescribed an antibiotic, take it as told by your health care provider. Do not stop taking it even if your condition starts to improve.  Wear shoes that fit well. Avoid wearing high-heeled shoes and shoes that are too tight or too loose.  Wear any padding, protective layers, gloves, or orthotics as told by your health care provider.  Soak your hands or feet and then use a file or pumice stone to soften your corn or callus. Do this as told by your health care provider.  Check your corn or callus every day for symptoms of infection. Contact a health care provider if you:  Notice that your symptoms do not improve with treatment.  Have redness or swelling that gets worse.  Notice that your corn or callus becomes painful.  Have fluid, blood, or pus coming from your corn or callus.  Have new symptoms. Summary  Corns are  small areas of thickened skin that occur on the top, sides, or tip of a toe.  Calluses are areas of thickened skin that can occur anywhere on the body, including the hands, fingers, palms, and soles of the feet. Calluses are usually larger than corns.  Corns and calluses are caused by rubbing (friction) or pressure, such as from shoes that are too tight or do not fit properly.  Treatment may include wearing any padding, protective layers, gloves, or orthotics as told by your health care provider. This information is not intended to replace advice given to you by your health care provider. Make sure you discuss any questions you have with your health care provider. Document Released: 03/23/2004 Document Revised: 04/30/2017 Document Reviewed: 04/30/2017 Elsevier Interactive Patient Education  2019 [employer]  

## 2018-07-24 NOTE — Progress Notes (Signed)
Subjective: [name] is here today for preventative foot care. He has painful, mycotic toenails and painful porokeratoses b/l feet right > left. His 5th metatarsal base lesion of the right foot is more symptomatic. He denies any redness, swelling or drainage on today.  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 Painful porokeratotic lesion x 3:  submetatarsal head 5 b/l and plantarlateral aspect right 5th metatarsal base  2. Onychomycosis 3-5 b/l    Plan: 1. Toenails 3-5 b/l were debrided in length and girth. 2. Porokeratoses debrided x 3. Pt noted relief post-debridement 3.   Patient to continue soft, supportive shoe gear 4.    Patient to report any pedal injuries to medical professional immediately. 5.   Follow up 3 months.Patient/POA to call should there be a concern in the interim.

## 2018-08-17 ENCOUNTER — Encounter: Payer: Self-pay | Admitting: Radiation Oncology

## 2018-08-17 NOTE — Progress Notes (Addendum)
GU Location of Tumor / Histology: prostatic adenocarcinoma  If Prostate Cancer, Gleason Score is (3 + 4) and PSA is (4.9) as of 07/2018. Prostate volume: 38 cc  Genia Del reports Dr. Tresa Moore did his kidney stone operation 2 years ago. Reports in subsequent follow up with Dr. Tresa Moore that he was told his PSA was elevated.   Biopsies of prostate  (if applicable) revealed:    Past/Anticipated interventions by urology, if any: prostate biopsy, referral for consideration of brachytherapy  Past/Anticipated interventions by medical oncology, if any: no  Weight changes, if any: no  Bowel/Bladder complaints, if any: IPSS 3 with nocturia only. SHIM 8.  Denies dysuria or hematuria. Reports urinary leakage associated with urgency.   Nausea/Vomiting, if any: no  Pain issues, if any:  Reports chronic pain related to arthritis.   SAFETY ISSUES:  Prior radiation? no  Pacemaker/ICD? no  Possible current pregnancy? no, male  Is the patient on methotrexate? no  Current Complaints / other details:  58 year old male. Has residual weakness fro CVA and blind in left eye. NKA. Single. Smoker. On disability. Resides in [facility].

## 2018-08-18 ENCOUNTER — Ambulatory Visit
Admission: RE | Admit: 2018-08-18 | Discharge: 2018-08-18 | Disposition: A | Discharge status: Home / Self Care | Payer: Medicaid Other | Source: Ambulatory Visit | Attending: Radiation Oncology | Admitting: Radiation Oncology

## 2018-08-18 ENCOUNTER — Encounter: Payer: Self-pay | Admitting: Medical Oncology

## 2018-08-18 ENCOUNTER — Telehealth: Payer: Self-pay | Admitting: Radiation Oncology

## 2018-08-18 ENCOUNTER — Other Ambulatory Visit: Payer: Self-pay

## 2018-08-18 ENCOUNTER — Encounter: Payer: Self-pay | Admitting: Radiation Oncology

## 2018-08-18 VITALS — BP 130/84 | HR 92 | Temp 98.0°F | Resp 20 | Ht 65.5 in | Wt 213.2 lb

## 2018-08-18 DIAGNOSIS — F17210 Nicotine dependence, cigarettes, uncomplicated: Secondary | ICD-10-CM | POA: Insufficient documentation

## 2018-08-18 DIAGNOSIS — Z7982 Long term (current) use of aspirin: Secondary | ICD-10-CM | POA: Insufficient documentation

## 2018-08-18 DIAGNOSIS — I10 Essential (primary) hypertension: Secondary | ICD-10-CM | POA: Diagnosis not present

## 2018-08-18 DIAGNOSIS — C61 Malignant neoplasm of prostate: Secondary | ICD-10-CM

## 2018-08-18 DIAGNOSIS — Z79899 Other long term (current) drug therapy: Secondary | ICD-10-CM | POA: Insufficient documentation

## 2018-08-18 DIAGNOSIS — J449 Chronic obstructive pulmonary disease, unspecified: Secondary | ICD-10-CM | POA: Diagnosis not present

## 2018-08-18 DIAGNOSIS — M1990 Unspecified osteoarthritis, unspecified site: Secondary | ICD-10-CM | POA: Insufficient documentation

## 2018-08-18 HISTORY — DX: Malignant neoplasm of prostate: C61

## 2018-08-18 NOTE — Progress Notes (Signed)
Introduced myself to [name] as the prostate nurse navigator and my role. After discussing his treatment options he is most interested in brachytherapy. He has residual weakness after suffering a CVA. He is able to drive and care for himself but feels daily radiation will be too hard. I gave him my business card and asked him to call me with questions or concerns. He voiced understanding.

## 2018-08-18 NOTE — Progress Notes (Signed)
Radiation Oncology         [phone] ________________________________  Initial outpatient Consultation  Name: [name] MRN: [id number]  Date: 08/18/2018  DOB: January 23, 1961  SF:[id number], Christean Grief, MD  [name], MD   REFERRING PHYSICIAN: [name], MD  DIAGNOSIS: 58 y.o. gentleman with Stage T2a adenocarcinoma of the prostate with Gleason score of 3+4, and PSA of 4.9.    ICD-10-CM   1. Malignant neoplasm of prostate ([place]) C61     HISTORY OF PRESENT ILLNESS: [name] is a 58 y.o. male with a diagnosis of prostate cancer. He is an established patient of Dr. Tresa Moore followed for recurrent kidney stones and UTIs.  His digital rectal examination has been performed annually, with 40 gm and smooth, but a new right mid apical induration was noted in 05/2018.  PSA at the time was 4.9, up from 3.7 the previous year. The patient proceeded to transrectal ultrasound with 12 biopsies of the prostate on 07/20/2017.  The prostate volume measured 38.7 cc.  Out of 12 core biopsies, 3 were positive.  The maximum Gleason score was 3+4, and this was seen in right mid lateral, right apex lateral, and left apex lateral. Perineural invasion was seen in the two positive cores on the right.  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  The patient reviewed the biopsy results with his urologist and he has kindly been referred today for discussion of potential radiation treatment options.   PREVIOUS RADIATION THERAPY: No  PAST MEDICAL HISTORY:  Past Medical History:  Diagnosis Date  . Arthritis   . 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 COPD (chronic obstructive pulmonary disease) ([place])   . Difficulty sleeping   . GERD (gastroesophageal reflux disease)   . High cholesterol   . History of gout   . Hypertension   . Prostate cancer  ([place])   . Recurrent kidney stones   . Stroke ([place]) sept 26,  20006    hx mini strokes in past, 1 large stroke  . Weakness of one side of body    L sided weakness after stroke      PAST SURGICAL HISTORY: Past Surgical History:  Procedure Laterality Date  . COLONOSCOPY WITH PROPOFOL N/A 09/27/2015   Procedure: COLONOSCOPY WITH PROPOFOL;  Surgeon: [name], MD;  Location: [facility];  Service: Endoscopy;  Laterality: N/A;  . CYSTOSCOPY N/A 10/05/2012   Procedure: CYSTOSCOPY, retrograde and right stent placement;  Surgeon: [name], MD;  Location: [facility];  Service: Urology;  Laterality: N/A;  . CYSTOSCOPY WITH RETROGRADE PYELOGRAM, URETEROSCOPY AND STENT PLACEMENT Right 04/13/2014   Procedure: CYSTOSCOPY WITH RETROGRADE PYELOGRAM, URETEROSCOPY AND STENT PLACEMENT, LASER ABLATION OF SCAR TISSUE;  Surgeon: [name], MD;  Location: [facility];  Service: Urology;  Laterality: Right;  . HOLMIUM LASER APPLICATION Right 70/07/7492   Procedure: HOLMIUM LASER APPLICATION;  Surgeon: [name], MD;  Location: [facility];  Service: Urology;  Laterality: Right;  . left ey surgery  age 26  . NEPHROLITHOTOMY Right 10/05/2012   Procedure: NEPHROLITHOTOMY PERCUTANEOUS;  Surgeon: [name], MD;  Location: [facility];  Service: Urology;  Laterality: Right;  WITH CYSTO AND SURGEON ACCESS   . NEPHROLITHOTOMY Right 10/07/2012   Procedure: NEPHROLITHOTOMY PERCUTANEOUS SECOND LOOK. Stent exchange,Ureteroscopy ;  Surgeon: [name], MD;  Location: [facility];  Service: Urology;  Laterality: Right;  . TOENAIL EXCISION      FAMILY HISTORY:  Family History  Problem Relation Age of Onset  . Brain cancer Maternal Grandfather     SOCIAL HISTORY:  Social History   Socioeconomic History  . Marital status: Single    Spouse name: Not on file  . Number of children: 0  . Years of education: Not on file  . Highest education level: Not on file  Occupational History    Comment: disability  Social Needs    . Financial resource strain: Not on file  . Food insecurity:    Worry: Not on file    Inability: Not on file  . Transportation needs:    Medical: Not on file    Non-medical: Not on file  Tobacco Use  . Smoking status: Current Every Day Smoker    Packs/day: 0.50    Years: 15.00    Pack years: 7.50    Types: Cigarettes  . Smokeless tobacco: Never Used  Substance and Sexual Activity  . Alcohol use: No  . Drug use: No  . Sexual activity: Not Currently  Lifestyle  . Physical activity:    Days per week: Not on file    Minutes per session: Not on file  . Stress: Not on file  Relationships  . Social connections:    Talks on phone: Not on file    Gets together: Not on file    Attends religious service: Not on file    Active member of club or organization: Not on file    Attends meetings of clubs or organizations: Not on file    Relationship status: Not on file  . Intimate partner violence:    Fear of current or ex partner: Not on file    Emotionally abused: Not on file    Physically abused: Not on file    Forced sexual activity: Not on file  Other Topics Concern  . Not on file  Social History Narrative  . Not on file    ALLERGIES: Patient has no known allergies.  MEDICATIONS:  Current Outpatient Medications  Medication Sig Dispense Refill  . albuterol (PROVENTIL HFA;VENTOLIN HFA) 108 (90 BASE) MCG/ACT inhaler Inhale 1-2 puffs into the lungs every 6 (six) hours as needed for wheezing or shortness of breath. Reported on 07/31/2015    . allopurinol (ZYLOPRIM) 300 MG tablet Take 300 mg by mouth every morning.     [name] aspirin EC 81 MG tablet Take 81 mg by mouth every morning.    . baclofen (LIORESAL) 20 MG tablet Take 10 mg by mouth 3 (three) times daily.     . budesonide-formoterol (SYMBICORT) 160-4.5 MCG/ACT inhaler Inhale 2 puffs into the lungs 2 (two) times daily.    . cephALEXin (KEFLEX) 500 MG capsule Take 500 mg by mouth 2 (two) times daily.    . cetirizine (ZYRTEC) 10 MG  tablet Take 10 mg by mouth every morning. Reported on 07/31/2015    . furosemide (LASIX) 20 MG tablet Take 20 mg by mouth daily at 10 pm. Reported on 07/31/2015    . HYDROcodone-acetaminophen (NORCO/VICODIN) 5-325 MG tablet Take 1-2 tablets by mouth every 6 (six) hours as needed for severe pain. 15 tablet 0  . hydrOXYzine (ATARAX/VISTARIL) 25 MG tablet Take 25 mg by mouth 3 (three) times daily as needed.    [name] losartan-hydrochlorothiazide (HYZAAR) 100-12.5 MG tablet Take 1 tablet by mouth daily.    . Multiple Vitamin (MULTIVITAMIN WITH  MINERALS) TABS tablet Take 1 tablet by mouth every morning.    . pantoprazole (PROTONIX) 40 MG tablet Take 40 mg by mouth daily.    . potassium citrate (UROCIT-K) 10 MEQ (1080 MG) SR tablet Take 10 mEq by mouth 2 (two) times daily at 10 AM and 5 PM. Reported on 07/31/2015    . simvastatin (ZOCOR) 20 MG tablet Take 20 mg by mouth at bedtime.    . traMADol (ULTRAM) 50 MG tablet Take 50 mg by mouth every 6 (six) hours as needed.    . traZODone (DESYREL) 50 MG tablet Take 50 mg by mouth at bedtime.     No current facility-administered medications for this encounter.     REVIEW OF SYSTEMS:  On review of systems, the patient reports that he is doing well overall. He denies any chest pain, shortness of breath, cough, fevers, chills, night sweats, unintended weight changes. He denies any bowel disturbances, and denies abdominal pain, nausea or vomiting. He denies any new musculoskeletal or joint aches or pains. His IPSS was 3, indicating mild urinary symptoms. He reports nocturia and urinary leakage associated with urgency. His SHIM was 8, indicating he has significant erectile dysfunction. A complete review of systems is obtained and is otherwise negative.    PHYSICAL EXAM:  Wt Readings from Last 3 Encounters:  08/18/18 213 lb 3.2 oz (96.7 kg)  04/23/18 218 lb (98.9 kg)  09/27/15 212 lb (96.2 kg)   Temp Readings from Last 3 Encounters:  08/18/18 98 F (36.7 C) (Oral)    04/23/18 97.9 F (36.6 C) (Oral)  09/27/15 98 F (36.7 C) (Oral)   BP Readings from Last 3 Encounters:  08/18/18 130/84  04/23/18 (!) 159/78  10/31/15 (!) 151/86   Pulse Readings from Last 3 Encounters:  08/18/18 92  04/23/18 78  10/31/15 87   Pain Assessment Pain Score: 0-No pain/10  In general this is a well appearing African American gentleman in no acute distress. He is alert and oriented x4 and appropriate throughout the examination. HEENT reveals that the patient is normocephalic, atraumatic. EOMs are intact. PERRLA. Skin is intact without any evidence of gross lesions. Cardiovascular exam reveals a regular rate and rhythm, no clicks rubs or murmurs are auscultated. There are expiratory wheezes noted bilaterally in the lung fields.  Lymphatic assessment is performed and does not reveal any adenopathy in the cervical, supraclavicular, axillary, or inguinal chains. Abdomen has active bowel sounds in all quadrants and is intact. The abdomen is soft, non tender, non distended. Lower extremities are negative for pretibial pitting edema, deep calf tenderness, cyanosis or clubbing.   KPS = 90  100 - Normal; no complaints; no evidence of disease. 90   - Able to carry on normal activity; minor signs or symptoms of disease. 80   - Normal activity with effort; some signs or symptoms of disease. 65   - Cares for self; unable to carry on normal activity or to do active work. 60   - Requires occasional assistance, but is able to care for most of his personal needs. 50   - Requires considerable assistance and frequent medical care. 78   - Disabled; requires special care and assistance. 46   - Severely disabled; hospital admission is indicated although death not imminent. 62   - Very sick; hospital admission necessary; active supportive treatment necessary. 10   - Moribund; fatal processes progressing rapidly. 0     - Dead  Karnofsky DA, Abelmann WH, Craver LS and  Burchenal JH [phone]) The  use of the nitrogen mustards in the palliative treatment of carcinoma: with particular reference to bronchogenic carcinoma Cancer 1 [phone]  LABORATORY DATA:  Lab Results  Component Value Date   WBC 7.6 04/07/2014   HGB 14.5 04/07/2014   HCT 44.4 04/07/2014   MCV 91.2 04/07/2014   PLT 268 04/07/2014   Lab Results  Component Value Date   NA 141 04/07/2014   K 4.9 04/07/2014   CL 102 04/07/2014   CO2 29 04/07/2014   No results found for: ALT, AST, GGT, ALKPHOS, BILITOT   RADIOGRAPHY: No results found.    IMPRESSION/PLAN: 1. 58 y.o. gentleman with Stage T2a adenocarcinoma of the prostate with Gleason Score of 3+4, and PSA of 4.9. We discussed the patient's workup and outlined the nature of prostate cancer in this setting. The patient's T stage, Gleason's score, and PSA put him into the favorable intermediate risk group. Accordingly, he is eligible for a variety of potential treatment options including brachytherapy, 5.5 weeks of external radiation or prostatectomy. We discussed the available radiation techniques, and focused on the details and logistics and delivery. We discussed and outlined the risks, benefits, short and long-term effects associated with radiotherapy and compared and contrasted these with prostatectomy. We discussed the role of SpaceOAR in reducing the rectal toxicity associated with radiotherapy.  At the end of the conversation the patient is interested in moving forward with brachytherapy and use of SpaceOAR to reduce rectal toxicity from radiotherapy.  We will share our discussion with Dr. Tresa Moore and move forward with scheduling his CT [facility] planning appointment in the near future.  The patient met briefly with Romie Jumper in our office who will be working closely with him to coordinate OR scheduling and pre and post procedure appointments.  We will contact the pharmaceutical rep to ensure that [place] is available at the time of procedure.  He will have a prostate MRI  following his post-seed CT SIM to confirm appropriate distribution of the [place].  We spent 60 minutes face to face with the patient and more than 50% of that time was spent in counseling and/or coordination of care.    Nicholos Johns, PA-C    Tyler Pita, MD  [facility] Direct Dial: [phone]  Fax: [phone] [email or web address]  Skype  LinkedIn   This document serves as a record of services personally performed by Tyler Pita, MD and Freeman Caldron, PA-C. It was created on their behalf by Wilburn Mylar, a trained medical scribe. The creation of this record is based on the scribe's personal observations and the provider's statements to them. This document has been checked and approved by the attending provider.

## 2018-08-18 NOTE — Progress Notes (Signed)
See progress note under physician encounter. 

## 2018-08-18 NOTE — Telephone Encounter (Signed)
Patient hasn't shown for 0830 appointment. Phoned to inquire. No answer. Left voicemail message requesting a return call to reschedule.

## 2018-08-19 DIAGNOSIS — C61 Malignant neoplasm of prostate: Secondary | ICD-10-CM | POA: Insufficient documentation

## 2018-08-24 ENCOUNTER — Telehealth: Payer: Self-pay | Admitting: *Deleted

## 2018-08-24 NOTE — Telephone Encounter (Signed)
CALLED PATIENT TO INFORM OF PRE-SEED PLANNING CT FOR 10-07-18 - ARRIVAL TIME - 12:30 PM @ [place], SPOKE WITH PATIENT AND HE IS AWARE OF THIS APPT.

## 2018-09-02 ENCOUNTER — Other Ambulatory Visit: Payer: Self-pay | Admitting: Urology

## 2018-10-06 ENCOUNTER — Telehealth: Payer: Self-pay | Admitting: *Deleted

## 2018-10-06 NOTE — Telephone Encounter (Signed)
Called patient to cancel tomorrow's pre-seed appt. due to COVID 19, this will be rescheduled per provider request, patient verified understanding this

## 2018-10-07 ENCOUNTER — Ambulatory Visit: Payer: Medicaid Other | Admitting: Radiation Oncology

## 2018-10-07 ENCOUNTER — Ambulatory Visit: Payer: Self-pay | Admitting: Urology

## 2018-10-07 DIAGNOSIS — C61 Malignant neoplasm of prostate: Secondary | ICD-10-CM | POA: Insufficient documentation

## 2018-10-07 DIAGNOSIS — Z510 Encounter for antineoplastic radiation therapy: Secondary | ICD-10-CM | POA: Insufficient documentation

## 2018-10-22 ENCOUNTER — Telehealth: Payer: Self-pay | Admitting: *Deleted

## 2018-10-22 NOTE — Telephone Encounter (Signed)
Called patient to inform of pre-seed planning CT for 10-23-18, spoke with patient and he is aware of  this appt.

## 2018-10-23 ENCOUNTER — Ambulatory Visit
Admission: RE | Admit: 2018-10-23 | Discharge: 2018-10-23 | Disposition: A | Discharge status: Home / Self Care | Payer: Medicaid Other | Source: Ambulatory Visit | Attending: Radiation Oncology | Admitting: Radiation Oncology

## 2018-10-23 ENCOUNTER — Ambulatory Visit
Admission: RE | Admit: 2018-10-23 | Discharge: 2018-10-23 | Disposition: A | Discharge status: Home / Self Care | Payer: Medicaid Other | Source: Ambulatory Visit | Attending: Urology | Admitting: Urology

## 2018-10-23 ENCOUNTER — Other Ambulatory Visit: Payer: Self-pay

## 2018-10-23 DIAGNOSIS — C61 Malignant neoplasm of prostate: Secondary | ICD-10-CM

## 2018-10-23 DIAGNOSIS — Z510 Encounter for antineoplastic radiation therapy: Secondary | ICD-10-CM | POA: Diagnosis present

## 2018-10-23 NOTE — Progress Notes (Signed)
  Radiation Oncology         [phone]) [phone] ________________________________  Name: [name] MRN: [id number]  Date: 10/23/2018  DOB: Nov 27, 1960  SIMULATION AND TREATMENT PLANNING NOTE PUBIC ARCH STUDY  MV:[id number], Christean Grief, MD  [name], MD  DIAGNOSIS: 58 y.o. gentleman with Stage T2a adenocarcinoma of the prostate with Gleason score of 3+4, and PSA of 4.9.     ICD-10-CM   1. Malignant neoplasm of prostate ([place]) C61     COMPLEX SIMULATION:  The patient presented today for evaluation for possible prostate seed implant. He was brought to the radiation planning suite and placed supine on the CT couch. A 3-dimensional image study set was obtained in upload to the planning computer. There, on each axial slice, I contoured the prostate gland. Then, using three-dimensional radiation planning tools I reconstructed the prostate in view of the structures from the transperineal needle pathway to assess for possible pubic arch interference. In doing so, I did not appreciate any pubic arch interference. Also, the patient's prostate volume was estimated based on the drawn structure. The volume was 38 cc.  Given the pubic arch appearance and prostate volume, patient remains a good candidate to proceed with prostate seed implant. Today, he freely provided informed written consent to proceed.    PLAN: The patient will undergo prostate seed implant.   ________________________________  Sheral Apley. [name], M.D.   This document serves as a record of services personally performed by Tyler Pita, MD. It was created on his behalf by Wilburn Mylar, a trained medical scribe. The creation of this record is based on the scribe's personal observations and the provider's statements to them. This document has been checked and approved by the attending provider.

## 2018-10-27 ENCOUNTER — Other Ambulatory Visit: Payer: Self-pay | Admitting: Urology

## 2018-10-27 DIAGNOSIS — C61 Malignant neoplasm of prostate: Secondary | ICD-10-CM

## 2018-10-30 ENCOUNTER — Encounter (HOSPITAL_COMMUNITY): Payer: Self-pay

## 2018-10-30 ENCOUNTER — Ambulatory Visit: Payer: Medicaid Other | Admitting: Podiatry

## 2018-10-30 NOTE — Patient Instructions (Addendum)
[name]  10/30/2018       Your procedure is scheduled on:  11-12-2018   Report to [facility] Main  Entrance, Report to Short Stay at  5:30 AM    Call this number if you have problems the morning of surgery [phone]     SPECIAL INSTRUCTIONS:  You are to do one fleet enema morning of surgery prior to arrival      Remember: Do not eat food or drink liquids :After Midnight.  This includes no water, candy, gum, or mints.  BRUSH YOUR TEETH MORNING OF SURGERY AND RINSE YOUR MOUTH OUT    Take these medicines the morning of surgery with A SIP OF WATER: None per pt's preference.  You can bring and use your inhaler as needed.                                  You may not have any metal on your body including piercings              Do not wear jewelry,  lotions, powders or perfumes, deodorant                          Men may shave face and neck.      Do not bring valuables to the hospital. [place].  Contacts, dentures or bridgework may not be worn into surgery.       Patients discharged the day of surgery WILL NOT be allowed to drive home. YOU MUST HAVE AN ADULT TO DRIVE YOU HOME AND BE WITH YOU FOR 24 HOURS AFTER SURGERY DUE TO ANESTHESIA.   YOU MAY NOT GO HOME BY BUS, TAXI,  OR UBER UNLESS  AN ADULT , A MUST,  ACCOMPANY YOU HOME AND STAY WITH YOU FOR 24 HOURS.   Name and phone number of your driver: [name] [phone]          _____________________________________________________________________             [facility] - Preparing for Surgery Before surgery, you can play an important role.  Because skin is not sterile, your skin needs to be as free of germs as possible.  You can reduce the number of germs on your skin by washing with CHG (chlorahexidine gluconate) soap before surgery.  CHG is an antiseptic cleaner which kills germs and bonds with the skin to continue killing germs  even after washing. Please DO NOT use if you have an allergy to CHG or antibacterial soaps.  If your skin becomes reddened/irritated stop using the CHG and inform your nurse when you arrive at Short Stay. Do not shave (including legs and underarms) for at least 48 hours prior to the first CHG shower.  You may shave your face/neck. Please follow these instructions carefully:  1.  Shower with CHG Soap the night before surgery and the  morning of Surgery.  2.  If you choose to wash your hair, wash your hair first as usual with your  normal  shampoo.  3.  After you shampoo, rinse your hair and body thoroughly to remove the  shampoo.  4.  Use CHG as you would any other liquid soap.  You can apply chg directly  to the skin and wash                       Gently with a scrungie or clean washcloth.  5.  Apply the CHG Soap to your body ONLY FROM THE NECK DOWN.   Do not use on face/ open                           Wound or open sores. Avoid contact with eyes, ears mouth and genitals (private parts).                       Wash face,  Genitals (private parts) with your normal soap.             6.  Wash thoroughly, paying special attention to the area where your surgery  will be performed.  7.  Thoroughly rinse your body with warm water from the neck down.  8.  DO NOT shower/wash with your normal soap after using and rinsing off  the CHG Soap.             9.  Pat yourself dry with a clean towel.            10.  Wear clean pajamas.            11.  Place clean sheets on your bed the night of your first shower and do not  sleep with pets. Day of Surgery : Do not apply any lotions/deodorants the morning of surgery.  Please wear clean clothes to the hospital/surgery center.  FAILURE TO FOLLOW THESE INSTRUCTIONS MAY RESULT IN THE CANCELLATION OF YOUR SURGERY PATIENT SIGNATURE_________________________________  NURSE  SIGNATURE__________________________________  ________________________________________________________________________

## 2018-11-02 ENCOUNTER — Other Ambulatory Visit: Payer: Self-pay

## 2018-11-02 ENCOUNTER — Encounter (HOSPITAL_COMMUNITY)
Admission: RE | Admit: 2018-11-02 | Discharge: 2018-11-02 | Disposition: A | Discharge status: Home / Self Care | Payer: Medicaid Other | Source: Ambulatory Visit | Attending: Urology | Admitting: Urology

## 2018-11-02 ENCOUNTER — Encounter (HOSPITAL_COMMUNITY): Payer: Self-pay

## 2018-11-02 DIAGNOSIS — F17200 Nicotine dependence, unspecified, uncomplicated: Secondary | ICD-10-CM | POA: Diagnosis not present

## 2018-11-02 DIAGNOSIS — M1990 Unspecified osteoarthritis, unspecified site: Secondary | ICD-10-CM | POA: Diagnosis not present

## 2018-11-02 DIAGNOSIS — Z01812 Encounter for preprocedural laboratory examination: Secondary | ICD-10-CM | POA: Insufficient documentation

## 2018-11-02 DIAGNOSIS — Z8673 Personal history of transient ischemic attack (TIA), and cerebral infarction without residual deficits: Secondary | ICD-10-CM | POA: Insufficient documentation

## 2018-11-02 DIAGNOSIS — C61 Malignant neoplasm of prostate: Secondary | ICD-10-CM | POA: Insufficient documentation

## 2018-11-02 DIAGNOSIS — I10 Essential (primary) hypertension: Secondary | ICD-10-CM | POA: Insufficient documentation

## 2018-11-02 DIAGNOSIS — K219 Gastro-esophageal reflux disease without esophagitis: Secondary | ICD-10-CM | POA: Diagnosis not present

## 2018-11-02 DIAGNOSIS — Z791 Long term (current) use of non-steroidal anti-inflammatories (NSAID): Secondary | ICD-10-CM | POA: Insufficient documentation

## 2018-11-02 DIAGNOSIS — Z7951 Long term (current) use of inhaled steroids: Secondary | ICD-10-CM | POA: Insufficient documentation

## 2018-11-02 DIAGNOSIS — E785 Hyperlipidemia, unspecified: Secondary | ICD-10-CM | POA: Insufficient documentation

## 2018-11-02 DIAGNOSIS — J449 Chronic obstructive pulmonary disease, unspecified: Secondary | ICD-10-CM | POA: Insufficient documentation

## 2018-11-02 DIAGNOSIS — Z79899 Other long term (current) drug therapy: Secondary | ICD-10-CM | POA: Diagnosis not present

## 2018-11-02 DIAGNOSIS — M109 Gout, unspecified: Secondary | ICD-10-CM | POA: Insufficient documentation

## 2018-11-02 DIAGNOSIS — Z7982 Long term (current) use of aspirin: Secondary | ICD-10-CM | POA: Insufficient documentation

## 2018-11-02 HISTORY — DX: Personal history of urinary calculi: Z87.442

## 2018-11-02 HISTORY — DX: Calculus of kidney: N20.0

## 2018-11-02 HISTORY — DX: Hyperlipidemia, unspecified: E78.5

## 2018-11-02 HISTORY — DX: Weakness: R53.1

## 2018-11-02 HISTORY — DX: Unspecified sequelae of cerebral infarction: I69.30

## 2018-11-02 LAB — COMPREHENSIVE METABOLIC PANEL
ALT: 14 U/L (ref 0–44)
AST: 11 U/L — ABNORMAL LOW (ref 15–41)
Albumin: 3.5 g/dL (ref 3.5–5.0)
Alkaline Phosphatase: 73 U/L (ref 38–126)
Anion gap: 8 (ref 5–15)
BUN: 13 mg/dL (ref 6–20)
CO2: 27 mmol/L (ref 22–32)
Calcium: 8.6 mg/dL — ABNORMAL LOW (ref 8.9–10.3)
Chloride: 104 mmol/L (ref 98–111)
Creatinine, Ser: 0.98 mg/dL (ref 0.61–1.24)
GFR calc Af Amer: >60 mL/min (ref >60)
GFR calc non Af Amer: >60 mL/min (ref >60)
Glucose, Bld: 109 mg/dL — ABNORMAL HIGH (ref 70–99)
Potassium: 3.7 mmol/L (ref 3.5–5.1)
Sodium: 139 mmol/L (ref 135–145)
Total Bilirubin: 0.5 mg/dL (ref 0.3–1.2)
Total Protein: 7.5 g/dL (ref 6.5–8.1)

## 2018-11-02 LAB — PROTIME-INR
INR: 1 (ref 0.8–1.2)
Prothrombin Time: 12.6 seconds (ref 11.4–15.2)

## 2018-11-02 LAB — CBC
HCT: 39.1 % (ref 39.0–52.0)
Hemoglobin: 11.5 g/dL — ABNORMAL LOW (ref 13.0–17.0)
MCH: 23 pg — ABNORMAL LOW (ref 26.0–34.0)
MCHC: 29.4 g/dL — ABNORMAL LOW (ref 30.0–36.0)
MCV: 78 fL — ABNORMAL LOW (ref 80.0–100.0)
Platelets: 359 10*3/uL (ref 150–400)
RBC: 5.01 MIL/uL (ref 4.22–5.81)
RDW: 21.2 % — ABNORMAL HIGH (ref 11.5–15.5)
WBC: 6.8 10*3/uL (ref 4.0–10.5)
nRBC: 0 % (ref 0.0–0.2)

## 2018-11-02 LAB — APTT: aPTT: 36 seconds (ref 24–36)

## 2018-11-02 NOTE — Progress Notes (Signed)
04-23-18 (EPICP EKG and CXR

## 2018-11-03 NOTE — Anesthesia Preprocedure Evaluation (Addendum)
Anesthesia Evaluation  Patient identified by MRN, date of birth, ID band Patient awake    Reviewed: Allergy & Precautions, NPO status , Patient's Chart, lab work & pertinent test results  Airway Mallampati: I  TM Distance: >3 FB Neck ROM: Full    Dental  (+) Edentulous Upper, Edentulous Lower   Pulmonary COPD,  COPD inhaler, Current Smoker,     + decreased breath sounds      Cardiovascular hypertension, Pt. on medications  Rhythm:Regular Rate:Normal     Neuro/Psych CVA, Residual Symptoms    GI/Hepatic Neg liver ROS, GERD  Medicated,  Endo/Other  negative endocrine ROS  Renal/GU      Musculoskeletal  (+) Arthritis ,   Abdominal (+) + obese,   Peds  Hematology negative hematology ROS (+)   Anesthesia Other Findings L sided weakness  Reproductive/Obstetrics                           Lab Results  Component Value Date   WBC 6.8 11/02/2018   HGB 11.5 (L) 11/02/2018   HCT 39.1 11/02/2018   MCV 78.0 (L) 11/02/2018   PLT 359 11/02/2018   Lab Results  Component Value Date   CREATININE 0.98 11/02/2018   BUN 13 11/02/2018   NA 139 11/02/2018   K 3.7 11/02/2018   CL 104 11/02/2018   CO2 27 11/02/2018   Lab Results  Component Value Date   INR 1.0 11/02/2018   INR 1.09 10/15/2012     Anesthesia Physical Anesthesia Plan  ASA: III  Anesthesia Plan: General   Post-op Pain Management:    Induction: Intravenous  PONV Risk Score and Plan: 2 and Ondansetron, Dexamethasone and Midazolam  Airway Management Planned: Oral ETT and LMA  Additional Equipment: None  Intra-op Plan:   Post-operative Plan: Extubation in OR  Informed Consent: I have reviewed the patients History and Physical, chart, labs and discussed the procedure including the risks, benefits and alternatives for the proposed anesthesia with the patient or authorized representative who has indicated his/her understanding  and acceptance.     Dental advisory given  Plan Discussed with: CRNA  Anesthesia Plan Comments: (See PAT note 11/02/2018, Konrad Felix, PA-C)      Anesthesia Quick Evaluation

## 2018-11-03 NOTE — Progress Notes (Signed)
Anesthesia Chart Review   Case:  [id number] Date/Time:  11/12/18 0715   Procedures:      RADIOACTIVE SEED IMPLANT/BRACHYTHERAPY IMPLANT (N/A ) - 93 MINS     SPACE OAR INSTILLATION (N/A )   Anesthesia type:  Choice   Pre-op diagnosis:  PROSTATE CANCER   Location:  [place] [facility]   Surgeon:  [name], MD      DISCUSSION: 58 yo current every day smoker (7.5 pack years) with h/o COPD, stroke (residual left sided weakness), HTN, GERD, HLD, anemia, recurrent kidney stones and UTIs, prostate cancer scheduled for above procedure 11/12/2018 with Dr. [name].   Pt last seen by PCP, Dr. [name], 08/03/18.  Pt stable at this visit.    Pt can proceed with planned procedure barring acute status change.  VS: BP 122/74 (BP Location: Left Arm)   Pulse 87   Temp 36.7 C (Oral)   Resp 18   Ht 5' 5.5" (1.664 m)   Wt 98.1 kg   SpO2 97%   BMI 35.43 kg/m   PROVIDERS: [name], MD is PCP   Tyler Pita, MD with Radiation Oncology  LABS: Labs reviewed: Acceptable for surgery. (all labs ordered are listed, but only abnormal results are displayed)  Labs Reviewed  CBC - Abnormal; Notable for the following components:      Result Value   Hemoglobin 11.5 (*)    MCV 78.0 (*)    MCH 23.0 (*)    MCHC 29.4 (*)    RDW 21.2 (*)    All other components within normal limits  COMPREHENSIVE METABOLIC PANEL - Abnormal; Notable for the following components:   Glucose, Bld 109 (*)    Calcium 8.6 (*)    AST 11 (*)    All other components within normal limits  APTT  PROTIME-INR     IMAGES:   EKG: 04/23/18 Rate 83 bpm Sinus rhythm  Left ventricular hypertrophy No significant change was found   CV:  Past Medical History:  Diagnosis Date  . Arthritis   . 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 COPD (chronic obstructive pulmonary disease) ([place])   . GERD (gastroesophageal reflux disease)   . 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 History of gout   . History of kidney stones   . Hyperlipidemia   . Hypertension   . Prostate cancer [facility]) urologist-- dr Darene Lamer. [facility];  oncologist-  dr [name]   dx 07-20-2017  . Renal calculi   . Stroke ([place]) sept 26,  20006    hx mini strokes in past, 1 large stroke  . Weakness of left side of body 03/2005   cva residual  . Weakness of one side of body    L sided weakness after stroke    Past Surgical History:  Procedure Laterality Date  . COLONOSCOPY WITH PROPOFOL N/A 09/27/2015   Procedure: COLONOSCOPY WITH PROPOFOL;  Surgeon: [name], MD;  [facility]: [facility];  Service: Endoscopy;  Laterality: N/A;  . CYSTOSCOPY N/A 10/05/2012   Procedure: CYSTOSCOPY, retrograde and right stent placement;  Surgeon: [name], MD;  Location: [facility];  Service: Urology;  Laterality: N/A;  . CYSTOSCOPY WITH RETROGRADE PYELOGRAM, URETEROSCOPY AND STENT PLACEMENT Right 04/13/2014   Procedure: CYSTOSCOPY WITH RETROGRADE PYELOGRAM, URETEROSCOPY AND STENT PLACEMENT, LASER ABLATION OF SCAR TISSUE;  Surgeon: [name], MD;  Location: [facility];  Service: Urology;  Laterality: Right;  . EYE SURGERY Left age 48  . HOLMIUM LASER APPLICATION Right 81/27/5170   Procedure: HOLMIUM LASER APPLICATION;  Surgeon: [name], MD;  [facility];  Service: Urology;  Laterality: Right;  . NEPHROLITHOTOMY Right 10/05/2012   Procedure: NEPHROLITHOTOMY PERCUTANEOUS;  Surgeon: [name], MD;  Location[facility];  Service: Urology;  Laterality: Right;  WITH CYSTO AND SURGEON ACCESS   . NEPHROLITHOTOMY Right 10/07/2012   Procedure: NEPHROLITHOTOMY PERCUTANEOUS SECOND LOOK. Stent exchange,Ureteroscopy ;  Surgeon: [name], MD;  [facility];  Service: Urology;  Laterality: Right;  . TOENAIL EXCISION      MEDICATIONS: . albuterol (PROVENTIL HFA;VENTOLIN HFA) 108 (90 BASE) MCG/ACT inhaler   . allopurinol (ZYLOPRIM) 300 MG tablet  . aspirin EC 81 MG tablet  . baclofen (LIORESAL) 10 MG tablet  . budesonide-formoterol (SYMBICORT) 160-4.5 MCG/ACT inhaler  . cephALEXin (KEFLEX) 500 MG capsule  . cetirizine (ZYRTEC) 10 MG tablet  . diclofenac sodium (VOLTAREN) 1 % GEL  . furosemide (LASIX) 20 MG tablet  . HYDROcodone-acetaminophen (NORCO/VICODIN) 5-325 MG tablet  . hydrOXYzine (ATARAX/VISTARIL) 25 MG tablet  . losartan-hydrochlorothiazide (HYZAAR) 100-12.5 MG tablet  . Multiple Vitamin (MULTIVITAMIN WITH MINERALS) TABS tablet  . pantoprazole (PROTONIX) 40 MG tablet  . potassium citrate (UROCIT-K) 10 MEQ (1080 MG) SR tablet  . simvastatin (ZOCOR) 20 MG tablet  . traMADol (ULTRAM) 50 MG tablet  . traZODone (DESYREL) 50 MG tablet   No current facility-administered medications for this encounter.      Maia Plan [facility] Pre-Surgical Testing [phone] 11/03/18 10:34 AM

## 2018-11-10 ENCOUNTER — Other Ambulatory Visit: Payer: Self-pay | Admitting: Urology

## 2018-11-11 ENCOUNTER — Other Ambulatory Visit: Payer: Self-pay

## 2018-11-11 ENCOUNTER — Telehealth (HOSPITAL_COMMUNITY): Payer: Self-pay | Admitting: *Deleted

## 2018-11-11 ENCOUNTER — Telehealth: Payer: Self-pay | Admitting: *Deleted

## 2018-11-11 ENCOUNTER — Other Ambulatory Visit (HOSPITAL_COMMUNITY)
Admission: RE | Admit: 2018-11-11 | Discharge: 2018-11-11 | Disposition: A | Discharge status: Home / Self Care | Payer: Medicaid Other | Source: Ambulatory Visit | Attending: Urology | Admitting: Urology

## 2018-11-11 DIAGNOSIS — Z1159 Encounter for screening for other viral diseases: Secondary | ICD-10-CM | POA: Diagnosis not present

## 2018-11-11 LAB — SARS CORONAVIRUS 2 BY RT PCR (HOSPITAL ORDER, PERFORMED IN ~~LOC~~ HOSPITAL LAB): SARS Coronavirus 2: NEGATIVE

## 2018-11-11 NOTE — Telephone Encounter (Signed)
CALLED PATIENT TO REMIND OF IMPLANT FOR 11-12-18, SPOKE WITH PATIENT AND HE IS AWARE OF THIS PROCEDURE

## 2018-11-12 ENCOUNTER — Ambulatory Visit (HOSPITAL_COMMUNITY): Payer: Medicaid Other | Admitting: Physician Assistant

## 2018-11-12 ENCOUNTER — Ambulatory Visit (HOSPITAL_COMMUNITY): Payer: Medicaid Other | Admitting: Certified Registered"

## 2018-11-12 ENCOUNTER — Ambulatory Visit (HOSPITAL_COMMUNITY)
Admission: RE | Admit: 2018-11-12 | Discharge: 2018-11-12 | Disposition: A | Discharge status: Home / Self Care | Payer: Medicaid Other | Source: Other Acute Inpatient Hospital | Attending: Urology | Admitting: Urology

## 2018-11-12 ENCOUNTER — Encounter (HOSPITAL_COMMUNITY): Payer: Self-pay

## 2018-11-12 ENCOUNTER — Encounter (HOSPITAL_COMMUNITY)
Admission: RE | Disposition: A | Discharge status: Home / Self Care | Payer: Self-pay | Source: Other Acute Inpatient Hospital | Attending: Urology

## 2018-11-12 ENCOUNTER — Encounter: Payer: Self-pay | Admitting: Medical Oncology

## 2018-11-12 ENCOUNTER — Ambulatory Visit (HOSPITAL_COMMUNITY): Payer: Medicaid Other

## 2018-11-12 DIAGNOSIS — H5462 Unqualified visual loss, left eye, normal vision right eye: Secondary | ICD-10-CM | POA: Diagnosis not present

## 2018-11-12 DIAGNOSIS — Z79899 Other long term (current) drug therapy: Secondary | ICD-10-CM | POA: Diagnosis not present

## 2018-11-12 DIAGNOSIS — F17210 Nicotine dependence, cigarettes, uncomplicated: Secondary | ICD-10-CM | POA: Insufficient documentation

## 2018-11-12 DIAGNOSIS — J449 Chronic obstructive pulmonary disease, unspecified: Secondary | ICD-10-CM | POA: Insufficient documentation

## 2018-11-12 DIAGNOSIS — Z7982 Long term (current) use of aspirin: Secondary | ICD-10-CM | POA: Insufficient documentation

## 2018-11-12 DIAGNOSIS — C61 Malignant neoplasm of prostate: Secondary | ICD-10-CM | POA: Diagnosis not present

## 2018-11-12 DIAGNOSIS — I69154 Hemiplegia and hemiparesis following nontraumatic intracerebral hemorrhage affecting left non-dominant side: Secondary | ICD-10-CM | POA: Diagnosis not present

## 2018-11-12 DIAGNOSIS — Z6835 Body mass index (BMI) 35.0-35.9, adult: Secondary | ICD-10-CM | POA: Insufficient documentation

## 2018-11-12 DIAGNOSIS — R34 Anuria and oliguria: Secondary | ICD-10-CM | POA: Diagnosis not present

## 2018-11-12 DIAGNOSIS — H5440 Blindness, one eye, unspecified eye: Secondary | ICD-10-CM | POA: Insufficient documentation

## 2018-11-12 DIAGNOSIS — I10 Essential (primary) hypertension: Secondary | ICD-10-CM | POA: Insufficient documentation

## 2018-11-12 DIAGNOSIS — R82991 Hypocitraturia: Secondary | ICD-10-CM | POA: Diagnosis not present

## 2018-11-12 DIAGNOSIS — E669 Obesity, unspecified: Secondary | ICD-10-CM | POA: Insufficient documentation

## 2018-11-12 DIAGNOSIS — K219 Gastro-esophageal reflux disease without esophagitis: Secondary | ICD-10-CM | POA: Diagnosis not present

## 2018-11-12 DIAGNOSIS — M1990 Unspecified osteoarthritis, unspecified site: Secondary | ICD-10-CM | POA: Insufficient documentation

## 2018-11-12 DIAGNOSIS — Z87442 Personal history of urinary calculi: Secondary | ICD-10-CM | POA: Diagnosis not present

## 2018-11-12 HISTORY — PX: RADIOACTIVE SEED IMPLANT: SHX5150

## 2018-11-12 HISTORY — PX: SPACE OAR INSTILLATION: SHX6769

## 2018-11-12 SURGERY — INSERTION, RADIATION SOURCE, PROSTATE
Anesthesia: General

## 2018-11-12 MED ORDER — MIDAZOLAM HCL 2 MG/2ML IJ SOLN
INTRAMUSCULAR | Status: AC
  Filled 2018-11-12: qty 2

## 2018-11-12 MED ORDER — MEPERIDINE HCL 50 MG/ML IJ SOLN: 6.2500 mg | INTRAMUSCULAR | Status: DC | PRN

## 2018-11-12 MED ORDER — LACTATED RINGERS IV SOLN: INTRAVENOUS | Status: DC

## 2018-11-12 MED ORDER — DEXAMETHASONE SODIUM PHOSPHATE 10 MG/ML IJ SOLN
INTRAMUSCULAR | Status: AC
  Filled 2018-11-12: qty 1

## 2018-11-12 MED ORDER — FENTANYL CITRATE (PF) 100 MCG/2ML IJ SOLN
INTRAMUSCULAR | Status: DC | PRN
  Administered 2018-11-12 (×2): 50 ug via INTRAVENOUS
  Administered 2018-11-12: 25 ug via INTRAVENOUS
  Administered 2018-11-12: 50 ug via INTRAVENOUS

## 2018-11-12 MED ORDER — LACTATED RINGERS IV SOLN
INTRAVENOUS | Status: DC
  Administered 2018-11-12: 06:00:00 via INTRAVENOUS

## 2018-11-12 MED ORDER — SODIUM CHLORIDE (PF) 0.9 % IJ SOLN
INTRAMUSCULAR | Status: AC
  Filled 2018-11-12: qty 10

## 2018-11-12 MED ORDER — PHENYLEPHRINE 40 MCG/ML (10ML) SYRINGE FOR IV PUSH (FOR BLOOD PRESSURE SUPPORT)
PREFILLED_SYRINGE | INTRAVENOUS | Status: AC
  Filled 2018-11-12: qty 10

## 2018-11-12 MED ORDER — SUGAMMADEX SODIUM 200 MG/2ML IV SOLN
INTRAVENOUS | Status: AC
  Filled 2018-11-12: qty 2

## 2018-11-12 MED ORDER — ONDANSETRON HCL 4 MG/2ML IJ SOLN
INTRAMUSCULAR | Status: AC
  Filled 2018-11-12: qty 2

## 2018-11-12 MED ORDER — DEXAMETHASONE SODIUM PHOSPHATE 10 MG/ML IJ SOLN
INTRAMUSCULAR | Status: DC | PRN
  Administered 2018-11-12: 8 mg via INTRAVENOUS

## 2018-11-12 MED ORDER — FENTANYL CITRATE (PF) 100 MCG/2ML IJ SOLN
INTRAMUSCULAR | Status: AC
  Filled 2018-11-12: qty 2

## 2018-11-12 MED ORDER — PHENYLEPHRINE 40 MCG/ML (10ML) SYRINGE FOR IV PUSH (FOR BLOOD PRESSURE SUPPORT)
PREFILLED_SYRINGE | INTRAVENOUS | Status: DC | PRN
  Administered 2018-11-12: 120 ug via INTRAVENOUS

## 2018-11-12 MED ORDER — ACETAMINOPHEN 160 MG/5ML PO SOLN: 325.0000 mg | Freq: Once | ORAL | Status: DC

## 2018-11-12 MED ORDER — ROCURONIUM BROMIDE 100 MG/10ML IV SOLN
INTRAVENOUS | Status: DC | PRN
  Administered 2018-11-12: 40 mg via INTRAVENOUS

## 2018-11-12 MED ORDER — SUCCINYLCHOLINE CHLORIDE 20 MG/ML IJ SOLN
INTRAMUSCULAR | Status: DC | PRN
  Administered 2018-11-12: 100 mg via INTRAVENOUS

## 2018-11-12 MED ORDER — ACETAMINOPHEN 10 MG/ML IV SOLN: 1000.0000 mg | Freq: Once | INTRAVENOUS | Status: DC | PRN

## 2018-11-12 MED ORDER — LIDOCAINE 2% (20 MG/ML) 5 ML SYRINGE
INTRAMUSCULAR | Status: DC | PRN
  Administered 2018-11-12: 60 mg via INTRAVENOUS
  Administered 2018-11-12: 40 mg via INTRAVENOUS

## 2018-11-12 MED ORDER — MIDAZOLAM HCL 2 MG/2ML IJ SOLN
INTRAMUSCULAR | Status: DC | PRN
  Administered 2018-11-12: 1 mg via INTRAVENOUS

## 2018-11-12 MED ORDER — STERILE WATER FOR IRRIGATION IR SOLN
Status: DC | PRN
  Administered 2018-11-12: 1000 mL

## 2018-11-12 MED ORDER — SUCCINYLCHOLINE CHLORIDE 200 MG/10ML IV SOSY
PREFILLED_SYRINGE | INTRAVENOUS | Status: AC
  Filled 2018-11-12: qty 10

## 2018-11-12 MED ORDER — IOHEXOL 300 MG/ML  SOLN
INTRAMUSCULAR | Status: DC | PRN
  Administered 2018-11-12: 5 mL

## 2018-11-12 MED ORDER — ONDANSETRON HCL 4 MG/2ML IJ SOLN
INTRAMUSCULAR | Status: DC | PRN
  Administered 2018-11-12: 4 mg via INTRAVENOUS

## 2018-11-12 MED ORDER — PROPOFOL 10 MG/ML IV BOLUS
INTRAVENOUS | Status: DC | PRN
  Administered 2018-11-12: 50 mg via INTRAVENOUS
  Administered 2018-11-12: 150 mg via INTRAVENOUS

## 2018-11-12 MED ORDER — CEFAZOLIN SODIUM-DEXTROSE 2-4 GM/100ML-% IV SOLN
2.0000 g | Freq: Once | INTRAVENOUS | Status: AC
  Administered 2018-11-12: 2 g via INTRAVENOUS
  Filled 2018-11-12: qty 100

## 2018-11-12 MED ORDER — LIDOCAINE 2% (20 MG/ML) 5 ML SYRINGE
INTRAMUSCULAR | Status: AC
  Filled 2018-11-12: qty 5

## 2018-11-12 MED ORDER — SODIUM CHLORIDE FLUSH 0.9 % IV SOLN
INTRAVENOUS | Status: DC | PRN
  Administered 2018-11-12: 10 mL via INTRAVENOUS

## 2018-11-12 MED ORDER — ACETAMINOPHEN 325 MG PO TABS: 325.0000 mg | ORAL_TABLET | Freq: Once | ORAL | Status: DC

## 2018-11-12 MED ORDER — FLEET ENEMA 7-19 GM/118ML RE ENEM
1.0000 | ENEMA | Freq: Once | RECTAL | Status: DC
  Filled 2018-11-12: qty 1

## 2018-11-12 MED ORDER — STERILE WATER FOR IRRIGATION IR SOLN
Status: DC | PRN
  Administered 2018-11-12: 500 mL

## 2018-11-12 MED ORDER — ROCURONIUM BROMIDE 10 MG/ML (PF) SYRINGE
PREFILLED_SYRINGE | INTRAVENOUS | Status: AC
  Filled 2018-11-12: qty 10

## 2018-11-12 MED ORDER — PROPOFOL 10 MG/ML IV BOLUS
INTRAVENOUS | Status: AC
  Filled 2018-11-12: qty 20

## 2018-11-12 MED ORDER — HYDROCODONE-ACETAMINOPHEN 5-325 MG PO TABS: 1.0000 | ORAL_TABLET | Freq: Four times a day (QID) | ORAL | 0 refills | Status: AC | PRN

## 2018-11-12 MED ORDER — PROMETHAZINE HCL 25 MG/ML IJ SOLN: 6.2500 mg | INTRAMUSCULAR | Status: DC | PRN

## 2018-11-12 MED ORDER — SUGAMMADEX SODIUM 200 MG/2ML IV SOLN
INTRAVENOUS | Status: DC | PRN
  Administered 2018-11-12: 200 mg via INTRAVENOUS

## 2018-11-12 MED ORDER — FENTANYL CITRATE (PF) 100 MCG/2ML IJ SOLN: 25.0000 ug | INTRAMUSCULAR | Status: DC | PRN

## 2018-11-12 SURGICAL SUPPLY — 26 items
BAG URINE DRAINAGE (UROLOGICAL SUPPLIES) ×1 IMPLANT
CATH FOLEY 2WAY SLVR  5CC 16FR (CATHETERS) ×2
CATH FOLEY 2WAY SLVR 5CC 16FR (CATHETERS) ×2 IMPLANT
CATH ROBINSON RED A/P 20FR (CATHETERS) ×3 IMPLANT
CONT SPEC 4OZ CLIKSEAL STRL BL (MISCELLANEOUS) ×6 IMPLANT
COVER MAYO STAND STRL (DRAPES) ×3 IMPLANT
COVER WAND RF STERILE (DRAPES) IMPLANT
DRSG TEGADERM 4X4.75 (GAUZE/BANDAGES/DRESSINGS) ×2 IMPLANT
DRSG TEGADERM 8X12 (GAUZE/BANDAGES/DRESSINGS) ×4 IMPLANT
GAUZE SPONGE 4X4 12PLY STRL (GAUZE/BANDAGES/DRESSINGS) ×2 IMPLANT
GLOVE BIO SURGEON STRL SZ7.5 (GLOVE) ×3 IMPLANT
GLOVE BIOGEL M STRL SZ7.5 (GLOVE) ×6 IMPLANT
GLOVE ECLIPSE 8.0 STRL XLNG CF (GLOVE) ×3 IMPLANT
GOWN STRL REUS W/TWL LRG LVL3 (GOWN DISPOSABLE) ×6 IMPLANT
HOLDER FOLEY CATH W/STRAP (MISCELLANEOUS) ×1 IMPLANT
I-SEED AGX100 Radionuclide Brachytherapy Source ×2 IMPLANT
IMPL SPACEOAR SYSTEM 10ML (Spacer) ×1 IMPLANT
IMPLANT SPACEOAR SYSTEM 10ML (Spacer) ×3 IMPLANT
KIT TURNOVER KIT A (KITS) ×2 IMPLANT
MARKER SKIN DUAL TIP RULER LAB (MISCELLANEOUS) ×3 IMPLANT
PACK CYSTO (CUSTOM PROCEDURE TRAY) ×3 IMPLANT
SURGILUBE 2OZ TUBE FLIPTOP (MISCELLANEOUS) ×3 IMPLANT
SYR 10ML LL (SYRINGE) ×3 IMPLANT
TAPE CLOTH SURG 4X10 WHT LF (GAUZE/BANDAGES/DRESSINGS) ×2 IMPLANT
TOWEL OR 17X26 10 PK STRL BLUE (TOWEL DISPOSABLE) ×3 IMPLANT
UNDERPAD 30X30 (UNDERPADS AND DIAPERS) ×6 IMPLANT

## 2018-11-12 NOTE — Discharge Instructions (Signed)
1 - You may have urinary urgency (bladder spasms) and bloody urine on / off for up to 2 weeks.  This is normal.  2 - Call MD or go to ER for fever >102, severe pain / nausea / vomiting not relieved by medications, or acute change in medical status  

## 2018-11-12 NOTE — Transfer of Care (Signed)
Immediate Anesthesia Transfer of Care Note  Patient: [name]  Procedure(s) Performed: RADIOACTIVE SEED IMPLANT/BRACHYTHERAPY IMPLANT (N/A ) SPACE OAR INSTILLATION (N/A )  Patient Location: PACU  Anesthesia Type:General  Level of Consciousness: awake, alert  and oriented  Airway & Oxygen Therapy: Patient Spontanous Breathing and Patient connected to face mask oxygen  Post-op Assessment: Report given to RN and Post -op Vital signs reviewed and stable  Post vital signs: Reviewed and stable  Last Vitals:  Vitals Value Taken Time  BP 140/81 11/12/2018  9:17 AM  Temp    Pulse 98 11/12/2018  9:18 AM  Resp 18 11/12/2018  9:18 AM  SpO2 98 % 11/12/2018  9:18 AM  Vitals shown include unvalidated device data.  Last Pain:  Vitals:   11/12/18 0555  TempSrc:   PainSc: 0-No pain         Complications: No apparent anesthesia complications

## 2018-11-12 NOTE — Op Note (Signed)
NAMEGWEN, [name] MEDICAL RECORD [id number] ACCOUNT [id number] DATE OF BIRTH:July 31, 1960 F[facility] LOCATION: [facility] PHYSICIAN:Zacharia Sowles, MD  OPERATIVE REPORT  DATE OF PROCEDURE:  11/12/2018  PREOPERATIVE DIAGNOSIS:  Moderate risk prostate cancer.  PROCEDURE: 1.  Cystoscopy. 2.  Brachytherapy radiation seed implantation. 3.  SpaceOAR perirectal gel matrix injection.  ESTIMATED BLOOD LOSS:  Nil.  MEDICATIONS:  None.  SPECIMENS:  None.  FINDINGS: 1.  Excellent placement of brachytherapy seeds as per prescribed dose. 2.  Unremarkable urinary bladder and prostate. 3.  Distal placement of gel matrix in the prerectal space with retrograde positioning of the rectum.  INDICATIONS:  The patient is a pleasant but comorbid 58 year old man with history of high-risk nephrolithiasis, as well as moderate-risk prostate cancer.  He was found on workup to have elevated PSA.  Options were discussed for management.  Given his  relatively small prostate comorbidity, he was selected to undergo primary brachytherapy.  He was evaluated by radiation oncology and found to be a suitable candidate.  He presents for this today.  Informed consent was obtained and placed in the medical  record.  PROCEDURE IN DETAIL:  The patient being identified, the procedure being cysto brachytherapy implantation and SpaceOAR placement was confirmed.  Procedure timeout was performed.  Intravenous antibiotics were administered.  General LMA anesthesia was  induced.  The patient was placed into a medium lithotomy position.  A Foley catheter was placed through the straight drain.  Intraoperative ultrasound was performed, and final planning dosing by radiation oncology as per separate note,  Next,  brachytherapy needles and seeds were placed as per prescribed dose and plan using exquisite care to avoid seed implantation, rectum or bladder, which did not occur with visualization of ultrasound.  After all seeds  had been placed, spot fluoroscopy  revealed the expected position.  The grid template was taken down.  Foley catheter was removed and cystourethroscopy was performed.  Cystoscopy revealed unremarkable anterior and posterior urethra.  No evidence of urethral trauma.  Inspection of urinary bladder revealed no diverticula, calcifications, or papillary lesions.  Ureteral orifices were singleton bilaterally.  There was no  evidence of intraluminal brachytherapy seeds within the bladder, prostate, or urethra retrograde.  Retroflexion revealed no additional findings.  The bladder was purposely left partially full and cystoscope removed.  The procedure was then terminated.   The patient tolerated the procedure well.  No immediate perioperative complications.  The patient was taken to postanesthesia care in stable condition with plan for discharge home after void.  LN/NUANCE  D:11/12/2018 T:11/12/2018 JOB:[id number]

## 2018-11-12 NOTE — Anesthesia Procedure Notes (Signed)
Procedure Name: Intubation Date/Time: 11/12/2018 7:48 AM Performed by: Niel Hummer, CRNA Pre-anesthesia Checklist: Patient being monitored, Suction available, Emergency Drugs available and Patient identified Patient Re-evaluated:Patient Re-evaluated prior to induction Oxygen Delivery Method: Circle system utilized Preoxygenation: Pre-oxygenation with 100% oxygen Induction Type: IV induction Ventilation: Two handed mask ventilation required Laryngoscope Size: Mac and 4 Grade View: Grade II Tube type: Oral Tube size: 7.0 mm Number of attempts: 1 Airway Equipment and Method: Stylet Placement Confirmation: ETT inserted through vocal cords under direct vision,  positive ETCO2 and breath sounds checked- equal and bilateral Secured at: 22 cm Tube secured with: Tape Dental Injury: Teeth and Oropharynx as per pre-operative assessment  Comments: Attempt to place LMA 4, significant air leak present. Attempt to place LMA 5, significant air leak present. Succs given and mask ventilation. DL x1 with grade 2b view. Tube placement successful.

## 2018-11-12 NOTE — Progress Notes (Signed)
Verified by phone, Lurena Joiner, is picking up pt after surgery.  She is a Industrial/product designer.  Gave her a time estimate of surgery end time.   Informed her recovery room nurse would call her when he is ready for d/c.  She voiced understanding.

## 2018-11-12 NOTE — Brief Op Note (Signed)
11/12/2018  9:02 AM  PATIENT:  [name]  58 y.o. male  PRE-OPERATIVE DIAGNOSIS:  PROSTATE CANCER  POST-OPERATIVE DIAGNOSIS:  PROSTATE CANCER  PROCEDURE:  Procedure(s) with comments: RADIOACTIVE SEED IMPLANT/BRACHYTHERAPY IMPLANT (N/A) - 90 MINS SPACE OAR INSTILLATION (N/A)  SURGEON:  Surgeon(s) and Role:    * [name], MD - Primary    * Tyler Pita, MD - Assisting  PHYSICIAN ASSISTANT:   ASSISTANTS: none   ANESTHESIA:   general  EBL:  minimal   BLOOD ADMINISTERED:none  DRAINS: none   LOCAL MEDICATIONS USED:  MARCAINE     SPECIMEN:  No Specimen  DISPOSITION OF SPECIMEN:  N/A  COUNTS:  YES  TOURNIQUET:  * No tourniquets in log *  DICTATION: .Other Dictation: Dictation Number [id number]  PLAN OF CARE: Discharge to home after PACU  PATIENT DISPOSITION:  PACU - hemodynamically stable.   Delay start of Pharmacological VTE agent (>24hrs) due to surgical blood loss or risk of bleeding: yes

## 2018-11-12 NOTE — Anesthesia Postprocedure Evaluation (Signed)
Anesthesia Post Note  Patient: [name]  Procedure(s) Performed: RADIOACTIVE SEED IMPLANT/BRACHYTHERAPY IMPLANT (N/A ) SPACE OAR INSTILLATION (N/A )     Patient location during evaluation: PACU Anesthesia Type: General Level of consciousness: awake and alert Pain management: pain level controlled Vital Signs Assessment: post-procedure vital signs reviewed and stable Respiratory status: spontaneous breathing, nonlabored ventilation, respiratory function stable and patient connected to nasal cannula oxygen Cardiovascular status: blood pressure returned to baseline and stable Postop Assessment: no apparent nausea or vomiting Anesthetic complications: no    Last Vitals:  Vitals:   11/12/18 1000 11/12/18 1010  BP: 128/78 140/83  Pulse: 90 87  Resp: 19 20  Temp: 36.4 C (!) 36.4 C  SpO2: 92% 95%    Last Pain:  Vitals:   11/12/18 1010  TempSrc:   PainSc: 0-No pain                 Effie Berkshire

## 2018-11-12 NOTE — H&P (Signed)
[name] is an 58 y.o. male.    Chief Complaint: Pre-op Brachytherapy  HPI:  1 - Nephrolithiasis - s/p Rt 2-stage PCNL 2014 for 4cm volume of Rt partial staghorn stone found on w/u of recurrent UTI. No prior stone episodes. Composition CaPO4. BMP normal. F/U CT post-op with some residual lowerpole stone that was not acessible due to extremely narrow infundibula.    Recent Course:   05/2017 - KUB stable Rt lower pole 1.5cm   05/2018 - KUB stable RLP stone 1.5cm   2 - Recurrent UTI - pt with multiple recent UTI's. Partial staghorn stone as per above. 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 PVR 08/2012 "62mL". Most Recent UCX's with e. coli and strep sens to cephalosporins (Keflex, Rocephin, Augmentin, Nitrofurantoin), but variably sensitive to others.    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 GFR stable.    5 - Moderate Risk Prostate Cancer - 3 cores up to 70% Grade 2 (Gleason 3+4=7) cancer (RLA, RLM, LLA) on eval rising PSA to 4.9 07/2018. TRUS 38 mL, no median lobe. After discussion with radiation oncology and with [place] about options he has chosen primary brachytherapy with SPACE-OAR, tentatively scheduled for 11/12/18.    PMH sig for CVA with residual weakness, left eye blindness from trauma, OA, HTN    Today [name] is seen to proceed with primary prostate brachytherapy. NO interval fevers.    Past Medical History:  Diagnosis Date  . Arthritis   . 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 COPD (chronic obstructive pulmonary disease) ([place])   . GERD (gastroesophageal reflux disease)   . 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 History of gout   . History of kidney stones   . Hyperlipidemia   . Hypertension   . Prostate cancer [facility]) urologist-- dr Darene Lamer. Tresa Moore;  oncologist-  dr [name]   dx 07-20-2017  . Renal calculi   . Stroke ([place]) sept 26,  20006    hx mini strokes in past, 1 large stroke  . Weakness of left side of body 03/2005   cva residual  . Weakness of one side of body    L sided weakness after stroke    Past Surgical History:  Procedure Laterality Date  . COLONOSCOPY WITH PROPOFOL N/A 09/27/2015   Procedure: COLONOSCOPY WITH PROPOFOL;  Surgeon: [name], MD;  [facility];  Service: Endoscopy;  Laterality: N/A;  . CYSTOSCOPY N/A 10/05/2012   Procedure: CYSTOSCOPY, retrograde and right stent placement;  Surgeon: [name], MD;  [facility];  Service: Urology;  Laterality: N/A;  . CYSTOSCOPY WITH RETROGRADE PYELOGRAM, URETEROSCOPY AND STENT PLACEMENT Right 04/13/2014   Procedure: CYSTOSCOPY WITH RETROGRADE PYELOGRAM, URETEROSCOPY AND STENT PLACEMENT, LASER ABLATION OF SCAR TISSUE;  Surgeon: [name], MD;  [facility];  Service: Urology;  Laterality: Right;  . EYE SURGERY Left age 1  . HOLMIUM LASER APPLICATION Right 92/42/6834   Procedure: HOLMIUM LASER APPLICATION;  Surgeon: [name], MD;  [facility];  Service: Urology;  Laterality: Right;  . NEPHROLITHOTOMY Right 10/05/2012   Procedure: NEPHROLITHOTOMY PERCUTANEOUS;  Surgeon: [name], MD;  Location[facility];  Service: Urology;  Laterality: Right;  WITH CYSTO AND SURGEON ACCESS   . NEPHROLITHOTOMY Right 10/07/2012   Procedure: NEPHROLITHOTOMY PERCUTANEOUS SECOND LOOK. Stent exchange,Ureteroscopy ;  Surgeon: [name], MD;  [facility];  Service: Urology;  Laterality: Right;  . TOENAIL EXCISION      Family History  Problem Relation Age of Onset  . Brain cancer Maternal Grandfather     Social History:  reports that he has been smoking cigarettes. He has a 7.50 pack-year smoking history. He has never used smokeless tobacco. He reports that he does not drink alcohol or use drugs.  Allergies: No Known Allergies  Medications Prior to Admission  Medication Sig Dispense Refill  . albuterol (PROVENTIL HFA;VENTOLIN HFA) 108 (90 BASE) MCG/ACT inhaler Inhale 1-2 puffs into the lungs every 6 (six) hours as needed for wheezing or shortness of breath. Reported on 07/31/2015    . allopurinol (ZYLOPRIM) 300 MG tablet Take 300 mg by mouth every morning.     [name] aspirin EC 81 MG tablet Take 81 mg by mouth every morning.     . baclofen (LIORESAL) 10 MG tablet Take 10 mg by mouth 3 (three) times daily.     . budesonide-formoterol (SYMBICORT) 160-4.5 MCG/ACT inhaler Inhale 2 puffs into the lungs 2 (two) times daily.    . cephALEXin (KEFLEX) 500 MG capsule Take 500 mg by mouth 2 (two) times daily.    . cetirizine (ZYRTEC) 10 MG tablet Take 10 mg by mouth every morning. Reported on 07/31/2015    . diclofenac sodium (VOLTAREN) 1 % GEL Apply 1 application topically 3 (three) times daily as needed (pain).    . furosemide (LASIX) 20 MG tablet Take 20 mg by mouth daily at 10 pm. Reported on 07/31/2015    . HYDROcodone-acetaminophen (NORCO/VICODIN) 5-325 MG tablet Take 1-2 tablets by mouth every 6 (six) hours as needed for severe pain. 15 tablet 0  . hydrOXYzine (ATARAX/VISTARIL) 25 MG tablet Take 25 mg by mouth 3 (three) times daily as needed.    [name] losartan-hydrochlorothiazide (HYZAAR) 100-12.5 MG tablet Take 1 tablet by mouth daily.    . Multiple Vitamin (MULTIVITAMIN WITH MINERALS) TABS tablet Take 1 tablet by mouth every morning.    . pantoprazole (PROTONIX) 40 MG tablet Take 40 mg by mouth daily.    . potassium citrate (UROCIT-K) 10 MEQ (1080 MG) SR tablet Take 10 mEq by mouth 2 (two) times daily at 10 AM and 5 PM. Reported on 07/31/2015    . simvastatin (ZOCOR) 20 MG tablet Take 20 mg by mouth at  bedtime.    . traMADol (ULTRAM) 50 MG tablet Take 50 mg by mouth every 6 (six) hours as needed.    . traZODone (DESYREL) 50 MG tablet Take 50 mg by mouth at bedtime.      Results for orders placed or performed during the hospital encounter of 11/11/18 (from the past 48 hour(s))  SARS Coronavirus 2 (CEPHEID - Performed in [facility] hospital lab), Hosp Order     Status: None   Collection Time: 11/11/18 11:50 AM  Result Value Ref Range   SARS Coronavirus 2 NEGATIVE NEGATIVE    Comment: (NOTE) If result is NEGATIVE SARS-CoV-2 target nucleic acids are NOT DETECTED. The SARS-CoV-2 RNA is generally detectable in upper and lower  respiratory specimens during the acute phase of infection. The lowest  concentration of SARS-CoV-2 viral copies this assay can detect is 250  copies / mL. A negative result does not preclude SARS-CoV-2 infection  and should not be used as the sole basis for treatment or other  patient management decisions.  A negative result may occur with  improper specimen collection / handling, submission of specimen other  than nasopharyngeal swab, presence of viral mutation(s) within the  areas targeted by this assay, and inadequate number of viral copies  (<250 copies / mL). A negative result must be combined with clinical  observations, patient history, and epidemiological information. If result is POSITIVE SARS-CoV-2 target nucleic acids are DETECTED. The SARS-CoV-2 RNA is generally detectable in upper and lower  respiratory specimens dur ing the acute phase of infection.  Positive  results are indicative of active infection with SARS-CoV-2.  Clinical  correlation with patient history and other diagnostic information is  necessary to determine patient infection status.  Positive results do  not rule out bacterial infection or co-infection with other viruses. If result is PRESUMPTIVE POSTIVE SARS-CoV-2 nucleic acids MAY BE PRESENT.   A presumptive positive result was  obtained on the submitted specimen  and confirmed on repeat testing.  While 2019 novel coronavirus  (SARS-CoV-2) nucleic acids may be present in the submitted sample  additional confirmatory testing may be necessary for epidemiological  and / or clinical management purposes  to differentiate between  SARS-CoV-2 and other Sarbecovirus currently known to infect humans.  If clinically indicated additional testing with an alternate test  methodology [phone]) is advised. The SARS-CoV-2 RNA is generally  detectable in upper and lower respiratory sp ecimens during the acute  phase of infection. The expected result is Negative. Fact Sheet for Patients:  StrictlyIdeas.no Fact Sheet for Healthcare Providers: BankingDealers.co.za This test is not yet approved or cleared by the [place] FDA and has been authorized for detection and/or diagnosis of SARS-CoV-2 by FDA under an Emergency Use Authorization (EUA).  This EUA will remain in effect (meaning this test can be used) for the duration of the COVID-19 declaration under Section 564(b)(1) of the Act, 21 U.S.C. section 360bbb-3(b)(1), unless the authorization is terminated or revoked sooner. Performed at [facility], [place] [address] [place], [place] [address]    No results found.  Review of Systems  Constitutional: Negative for chills and fever.  All other systems reviewed and are negative.   Blood pressure (!) 152/91, pulse 88, temperature 97.9 F (36.6 C), temperature source Oral, resp. rate 18, height 5' 5.5" (1.664 m), weight 98.1 kg, SpO2 99 %. Physical Exam  Constitutional: He appears well-developed.  Stable stigmata of priro CVA  Neck: Normal range of motion.  Cardiovascular: Normal rate.  Respiratory: Effort normal.  GI:  Stable obesity  Genitourinary:    Penis normal.   Neurological: He is alert.  Skin: Skin is warm.     Assessment/Plan  Proceed as  planned with primary prostate brachytehrapy.   [name], MD 11/12/2018, 7:09 AM

## 2018-11-13 ENCOUNTER — Encounter (HOSPITAL_COMMUNITY): Payer: Self-pay | Admitting: Urology

## 2018-11-13 NOTE — Progress Notes (Signed)
  Radiation Oncology         [phone] ________________________________  Name: DAELEN BELVEDERE MRN: [id number]  Date: 11/13/2018  DOB: 03/13/1961       Prostate Seed Implant  JS:[email or web address], Christean Grief, MD  No ref. provider found  DIAGNOSIS: 58 y.o. gentleman with Stage T2a adenocarcinoma of the prostate with Gleason score of 3+4, and PSA of 4.9.   PROCEDURE: Insertion of radioactive I-125 seeds into the prostate gland.  RADIATION DOSE: 145 Gy, definitive therapy.  TECHNIQUE: EUGENE ISADORE was brought to the operating room with the urologist. He was placed in the dorsolithotomy position. He was catheterized and a rectal tube was inserted. The perineum was shaved, prepped and draped. The ultrasound probe was then introduced into the rectum to see the prostate gland.  TREATMENT DEVICE: A needle grid was attached to the ultrasound probe stand and anchor needles were placed.  3D PLANNING: The prostate was imaged in 3D using a sagittal sweep of the prostate probe. These images were transferred to the planning computer. There, the prostate, urethra and rectum were defined on each axial reconstructed image. Then, the software created an optimized 3D plan and a few seed positions were adjusted. The quality of the plan was reviewed using [facility] information for the target and the following two organs at risk:  Urethra and Rectum.  Then the accepted plan was printed and handed off to the radiation therapist.  Under my supervision, the custom loading of the seeds and spacers was carried out and loaded into sealed vicryl sleeves.  These pre-loaded needles were then placed into the needle holder.[name]  PROSTATE VOLUME STUDY:  Using transrectal ultrasound the volume of the prostate was verified to be 34.3 cc.  SPECIAL TREATMENT PROCEDURE/SUPERVISION AND HANDLING: The pre-loaded needles were then delivered under sagittal guidance. A total of 24 needles were used to deposit 62 seeds in the prostate gland. The individual  seed activity was 0.491 mCi.  SpaceOAR:  Yes  COMPLEX SIMULATION: At the end of the procedure, an anterior radiograph of the pelvis was obtained to document seed positioning and count. Cystoscopy was performed to check the urethra and bladder.  MICRODOSIMETRY: At the end of the procedure, the patient was emitting 0.048 mR/hr at 1 meter. Accordingly, he was considered safe for hospital discharge.  PLAN: The patient will return to the radiation oncology clinic for post implant CT dosimetry in three weeks.   ________________________________  Sheral Apley [name], M.D.

## 2018-11-25 ENCOUNTER — Telehealth: Payer: Self-pay | Admitting: *Deleted

## 2018-11-25 NOTE — Telephone Encounter (Signed)
CALLED PATIENT TO REMIND OF POST SEED APPTS. FOR 11-26-18 AND HIS MRI FOR 11-26-18 - ARRIVAL TIME- 11:30 AM , NO RESTRICTIONS TO TEST, SPOKE WITH PATIENT AND HE IS AWARE OF THESE APPTS.

## 2018-11-26 ENCOUNTER — Other Ambulatory Visit: Payer: Self-pay

## 2018-11-26 ENCOUNTER — Ambulatory Visit
Admission: RE | Admit: 2018-11-26 | Discharge: 2018-11-26 | Disposition: A | Discharge status: Home / Self Care | Payer: Medicaid Other | Source: Ambulatory Visit | Attending: Radiation Oncology | Admitting: Radiation Oncology

## 2018-11-26 ENCOUNTER — Ambulatory Visit
Admission: RE | Admit: 2018-11-26 | Discharge: 2018-11-26 | Disposition: A | Discharge status: Home / Self Care | Payer: Medicaid Other | Source: Ambulatory Visit | Attending: Urology | Admitting: Urology

## 2018-11-26 ENCOUNTER — Ambulatory Visit (HOSPITAL_COMMUNITY)
Admission: RE | Admit: 2018-11-26 | Discharge: 2018-11-26 | Disposition: A | Discharge status: Home / Self Care | Payer: Medicaid Other | Source: Ambulatory Visit | Attending: Urology | Admitting: Urology

## 2018-11-26 ENCOUNTER — Encounter: Payer: Self-pay | Admitting: Urology

## 2018-11-26 VITALS — BP 125/75 | HR 84 | Temp 98.9°F | Resp 20 | Wt 217.2 lb

## 2018-11-26 DIAGNOSIS — Z510 Encounter for antineoplastic radiation therapy: Secondary | ICD-10-CM | POA: Insufficient documentation

## 2018-11-26 DIAGNOSIS — C61 Malignant neoplasm of prostate: Secondary | ICD-10-CM

## 2018-11-26 NOTE — Progress Notes (Signed)
Radiation Oncology         [phone]) [phone] ________________________________  Name: [name] MRN: [id number]  Date: 11/26/2018  DOB: 17-Apr-1961  Post-Seed Follow-Up Visit Note  CC: [name], MD  [name], MD  Diagnosis:   58 y.o.gentleman with Stage T2aadenocarcinoma of the prostate with Gleason score of 3+4, and PSA of4.9.     ICD-10-CM   1. Malignant neoplasm of prostate (HCC) C61     Interval Since Last Radiation: 2 weeks  11/12/18:  Insertion of radioactive I-125 seeds into the prostate gland; 145 Gy, definitive therapy with SpaceOAR gel placement.  Narrative:  I spoke with the patient to conduct his routine scheduled postseed follow up visit via telephone to spare the patient unnecessary potential exposure in the healthcare setting during the current COVID-19 pandemic.  The patient was notified in advance and gave permission to proceed with this visit format.  He is complaining of increased urinary frequency and urinary hesitation symptoms but reports that these are gradually improving.  He feels that he empties his bladder well on voiding and denies gross hematuria, dysuria, straining to void, fever, chills or incontinence. He filled out a questionnaire regarding urinary function today providing and overall IPSS score of 24 characterizing his symptoms as moderate-severe.  His pre-implant score was 3. He reports a healthy appetite and is maintaining his weight.  He denies abdominal pain, N/V, diarrhea or constipation.  Overall, he is quite pleased with his progress to date.  ALLERGIES:  has No Known Allergies.  Meds: Current Outpatient Medications  Medication Sig Dispense Refill  . albuterol (PROVENTIL HFA;VENTOLIN HFA) 108 (90 BASE) MCG/ACT inhaler Inhale 1-2 puffs into the lungs every 6 (six) hours as needed for wheezing or shortness of breath. Reported on 07/31/2015    . allopurinol (ZYLOPRIM) 300 MG tablet Take 300 mg by mouth every morning.     [name] aspirin EC 81  MG tablet Take 81 mg by mouth every morning.     . baclofen (LIORESAL) 10 MG tablet Take 10 mg by mouth 3 (three) times daily.     . budesonide-formoterol (SYMBICORT) 160-4.5 MCG/ACT inhaler Inhale 2 puffs into the lungs 2 (two) times daily.    . cephALEXin (KEFLEX) 500 MG capsule Take 500 mg by mouth 2 (two) times daily.    . cetirizine (ZYRTEC) 10 MG tablet Take 10 mg by mouth every morning. Reported on 07/31/2015    . chlorhexidine (PERIDEX) 0.12 % solution RINSE WITH 15ML FOR 30 SECONDS AND SPIT USE TWICE DAILY IN THE MORNING AND AFTERNOON AFTER BRUSHING. DO NOT SWALLOW    . diclofenac sodium (VOLTAREN) 1 % GEL Apply 1 application topically 3 (three) times daily as needed (pain).    . furosemide (LASIX) 20 MG tablet Take 20 mg by mouth daily at 10 pm. Reported on 07/31/2015    . HYDROcodone-acetaminophen (NORCO/VICODIN) 5-325 MG tablet Take 1-2 tablets by mouth every 6 (six) hours as needed for moderate pain or severe pain. 20 tablet 0  . hydrOXYzine (ATARAX/VISTARIL) 25 MG tablet Take 25 mg by mouth 3 (three) times daily as needed.    [name] losartan-hydrochlorothiazide (HYZAAR) 100-12.5 MG tablet Take 1 tablet by mouth daily.    . Multiple Vitamin (MULTIVITAMIN WITH MINERALS) TABS tablet Take 1 tablet by mouth every morning.    . pantoprazole (PROTONIX) 40 MG tablet Take 40 mg by mouth daily.    . potassium citrate (UROCIT-K) 10 MEQ (1080 MG) SR tablet Take 10 mEq by mouth 2 (two)  times daily at 10 AM and 5 PM. Reported on 07/31/2015    . simvastatin (ZOCOR) 20 MG tablet Take 20 mg by mouth at bedtime.    . traMADol (ULTRAM) 50 MG tablet Take 50 mg by mouth every 6 (six) hours as needed.    . traZODone (DESYREL) 50 MG tablet Take 50 mg by mouth at bedtime.     No current facility-administered medications for this encounter.     Physical Findings:  Vitals:   BP: 125/75 RA P: 84 T: 98.9 Resp: 20 O2: 96% Weight: 217 lbs  In general this is a well appearing African American male in no acute  distress. He's alert and oriented x4 and appropriate throughout the examination. Cardiopulmonary assessment is negative for acute distress and he exhibits normal effort.   Lab Findings: Lab Results  Component Value Date   WBC 6.8 11/02/2018   HGB 11.5 (L) 11/02/2018   HCT 39.1 11/02/2018   MCV 78.0 (L) 11/02/2018   PLT 359 11/02/2018    Radiographic Findings:  Patient underwent CT imaging in our clinic for post implant dosimetry. The CT will be reviewed by Dr. [name] to confirm an adequate distribution of radioactive seeds throughout the prostate gland and ensure are no seeds in or near the rectum. He will have a prostate MRI later today and these images will be fused with the CT images for further review.  We suspect the final radiation plan and dosimetry will show appropriate coverage of the prostate gland. He understands that we will call him should there be any unexpected findings on further evaluation.  Impression/Plan: 58 y.o.gentleman with Stage T2aadenocarcinoma of the prostate with Gleason score of 3+4, and PSA of4.9.  The patient is recovering from the effects of radiation. His urinary symptoms should gradually improve over the next 4-6 months. We talked about this today. He is encouraged by his improvement already and is otherwise pleased with his outcome. We also talked about long-term follow-up for prostate cancer following seed implant. He understands that ongoing PSA determinations and digital rectal exams will help perform surveillance to rule out disease recurrence. He has a follow up appointment scheduled with Dr. Tresa Moore on 02/09/19. He understands what to expect with his PSA measures. Patient was also educated today about some of the long-term effects from radiation including a small risk for rectal bleeding and possibly erectile dysfunction. We talked about some of the general management approaches to these potential complications. However, I did encourage the patient to contact  our office or return at any point if he has questions or concerns related to his previous radiation and prostate cancer.    Nicholos Johns, PA-C

## 2018-11-26 NOTE — Progress Notes (Signed)
  Radiation Oncology         [phone] ________________________________  Name: [name] MRN: [id number]  Date: 11/26/2018  DOB: 11-20-1960  COMPLEX SIMULATION NOTE  NARRATIVE:  The patient was brought to the [facility] today following prostate seed implantation approximately one month ago.  Identity was confirmed.  All relevant records and images related to the planned course of therapy were reviewed.  Then, the patient was set-up supine.  CT images were obtained.  The CT images were loaded into the planning software.  Then the prostate and rectum were contoured.  Treatment planning then occurred.  The implanted iodine 125 seeds were identified by the physics staff for projection of radiation distribution  I have requested : 3D Simulation  I have requested a DVH of the following structures: Prostate and rectum.    ________________________________  Sheral Apley [name], M.D.

## 2018-12-24 ENCOUNTER — Ambulatory Visit
Admission: RE | Admit: 2018-12-24 | Discharge: 2018-12-24 | Disposition: A | Discharge status: Home / Self Care | Payer: Medicaid Other | Source: Ambulatory Visit | Attending: Radiation Oncology | Admitting: Radiation Oncology

## 2018-12-24 ENCOUNTER — Encounter: Payer: Self-pay | Admitting: Radiation Oncology

## 2018-12-24 DIAGNOSIS — C61 Malignant neoplasm of prostate: Secondary | ICD-10-CM | POA: Insufficient documentation

## 2018-12-24 DIAGNOSIS — Z510 Encounter for antineoplastic radiation therapy: Secondary | ICD-10-CM | POA: Insufficient documentation

## 2019-01-06 ENCOUNTER — Other Ambulatory Visit: Payer: Self-pay

## 2019-01-06 ENCOUNTER — Encounter: Payer: Self-pay | Admitting: Podiatry

## 2019-01-06 ENCOUNTER — Ambulatory Visit: Payer: Medicaid Other | Admitting: Podiatry

## 2019-01-06 VITALS — Temp 98.2°F

## 2019-01-06 DIAGNOSIS — E1140 Type 2 diabetes mellitus with diabetic neuropathy, unspecified: Secondary | ICD-10-CM | POA: Diagnosis not present

## 2019-01-06 DIAGNOSIS — M79676 Pain in unspecified toe(s): Secondary | ICD-10-CM

## 2019-01-06 DIAGNOSIS — B351 Tinea unguium: Secondary | ICD-10-CM

## 2019-01-06 DIAGNOSIS — M79675 Pain in left toe(s): Secondary | ICD-10-CM

## 2019-01-06 DIAGNOSIS — E119 Type 2 diabetes mellitus without complications: Secondary | ICD-10-CM

## 2019-01-06 NOTE — Progress Notes (Signed)
Subjective: [name] presents today with painful, thick toenails 3-5 b/l that he cannot cut and which interfere with daily activities.  Pain is aggravated when wearing enclosed shoe gear.  [name] is asking for his calluses not to be trimmed today.  [name], MD is his PCP.   Current Outpatient Medications:  .  albuterol (PROVENTIL HFA;VENTOLIN HFA) 108 (90 BASE) MCG/ACT inhaler, Inhale 1-2 puffs into the lungs every 6 (six) hours as needed for wheezing or shortness of breath. Reported on 07/31/2015, Disp: , Rfl:  .  allopurinol (ZYLOPRIM) 300 MG tablet, Take 300 mg by mouth every morning. , Disp: , Rfl:  .  aspirin EC 81 MG tablet, Take 81 mg by mouth every morning. , Disp: , Rfl:  .  baclofen (LIORESAL) 10 MG tablet, Take 10 mg by mouth 3 (three) times daily. , Disp: , Rfl:  .  budesonide-formoterol (SYMBICORT) 160-4.5 MCG/ACT inhaler, Inhale 2 puffs into the lungs 2 (two) times daily., Disp: , Rfl:  .  cephALEXin (KEFLEX) 500 MG capsule, Take 500 mg by mouth 2 (two) times daily., Disp: , Rfl:  .  cetirizine (ZYRTEC) 10 MG tablet, Take 10 mg by mouth every morning. Reported on 07/31/2015, Disp: , Rfl:  .  chlorhexidine (PERIDEX) 0.12 % solution, RINSE WITH 15ML FOR 30 SECONDS AND SPIT USE TWICE DAILY IN THE MORNING AND AFTERNOON AFTER BRUSHING. DO NOT SWALLOW, Disp: , Rfl:  .  diclofenac sodium (VOLTAREN) 1 % GEL, Apply 1 application topically 3 (three) times daily as needed (pain)., Disp: , Rfl:  .  furosemide (LASIX) 20 MG tablet, Take 20 mg by mouth daily at 10 pm. Reported on 07/31/2015, Disp: , Rfl:  .  HYDROcodone-acetaminophen (NORCO/VICODIN) 5-325 MG tablet, Take 1-2 tablets by mouth every 6 (six) hours as needed for moderate pain or severe pain., Disp: 20 tablet, Rfl: 0 .  hydrOXYzine (ATARAX/VISTARIL) 25 MG tablet, Take 25 mg by mouth 3 (three) times daily as needed., Disp: , Rfl:  .  losartan-hydrochlorothiazide (HYZAAR) 100-12.5 MG tablet, Take 1 tablet by mouth daily.,  Disp: , Rfl:  .  Multiple Vitamin (MULTIVITAMIN WITH MINERALS) TABS tablet, Take 1 tablet by mouth every morning., Disp: , Rfl:  .  pantoprazole (PROTONIX) 40 MG tablet, Take 40 mg by mouth daily., Disp: , Rfl:  .  potassium citrate (UROCIT-K) 10 MEQ (1080 MG) SR tablet, Take 10 mEq by mouth 2 (two) times daily at 10 AM and 5 PM. Reported on 07/31/2015, Disp: , Rfl:  .  simvastatin (ZOCOR) 20 MG tablet, Take 20 mg by mouth at bedtime., Disp: , Rfl:  .  traMADol (ULTRAM) 50 MG tablet, Take 50 mg by mouth every 6 (six) hours as needed., Disp: , Rfl:  .  traZODone (DESYREL) 50 MG tablet, Take 50 mg by mouth at bedtime., Disp: , Rfl:   No Known Allergies  Objective: Vitals:   01/06/19 0809  Temp: 98.2 F (36.8 C)    Vascular Examination: Capillary refill time <3 seconds x 10 digits.  Dorsalis pedis and Posterior tibial pulses palpable b/l.  Digital hair sparse x 10 digits  Skin temperature gradient WNL b/l.  Dermatological Examination: Skin with normal turgor, texture and tone b/l.  Toenails 3-5 b/l discolored, thick, dystrophic with subungual debris and pain with palpation to nailbeds due to thickness of nails.  Anonychia b/l great and 2nd toe(s) with evidence of permanent total nail avulsion. Nailbed(s) completely epithelialized and intact.  Porokeratotic lesion submet head 5 b/l and plantarlateral  aspect 5th met base right foot.  Musculoskeletal: Muscle strength 5/5 to all LE muscle groups.  No gross bony deformities b/l.  No pain, crepitus or joint limitation noted with ROM.   Neurological: Sensation intact 5/5 b/l with 10 gram monofilament.  Vibratory sensation intact.  Assessment: Painful onychomycosis toenails 3-5 b/l  NIDDM Encounter for diabetic foot exam  Plan: 1. Toenails 1-5 b/l were debrided in length and girth without iatrogenic bleeding. 2. Diabetic foot exam performed today. 3. Per request of patient, porokeratoses not debrided. 4. Patient to  continue soft, supportive shoe gear daily. 5. Patient to report any pedal injuries to medical professional immediately. 6. Follow up 3 months.  7. Patient/POA to call should there be a concern in the interim.

## 2019-01-06 NOTE — Patient Instructions (Signed)

## 2019-01-17 NOTE — Progress Notes (Signed)
  Radiation Oncology         [phone] ________________________________  Name: [name] MRN: [id number]  Date: 12/24/2018  DOB: 02/21/1961  3D Planning Note   Prostate Brachytherapy Post-Implant Dosimetry  Diagnosis: 58 y.o. gentleman with Stage T2a adenocarcinoma of the prostate with Gleason score of 3+4, and PSA of 4.9  Narrative: On a previous date, [name] returned following prostate seed implantation for post implant planning. He underwent CT scan complex simulation to delineate the three-dimensional structures of the pelvis and demonstrate the radiation distribution.  Since that time, the seed localization, and complex isodose planning with dose volume histograms have now been completed.  Results:   Prostate Coverage - The dose of radiation delivered to the 90% or more of the prostate gland (D90) was 103.31% of the prescription dose. This exceeds our goal of greater than 90%. Rectal Sparing - The volume of rectal tissue receiving the prescription dose or higher was 0.0 cc. This falls under our thresholds tolerance of 1.0 cc.  Impression: The prostate seed implant appears to show adequate target coverage and appropriate rectal sparing.  Plan:  The patient will continue to follow with urology for ongoing PSA determinations. I would anticipate a high likelihood for local tumor control with minimal risk for rectal morbidity.  ________________________________  Sheral Apley [name], M.D.

## 2019-02-15 ENCOUNTER — Ambulatory Visit: Payer: Medicaid Other | Attending: Internal Medicine | Admitting: Physical Therapy

## 2019-02-15 ENCOUNTER — Ambulatory Visit: Payer: Medicaid Other | Admitting: Occupational Therapy

## 2019-02-15 ENCOUNTER — Encounter: Payer: Self-pay | Admitting: Occupational Therapy

## 2019-02-15 ENCOUNTER — Other Ambulatory Visit: Payer: Self-pay

## 2019-02-15 ENCOUNTER — Encounter: Payer: Self-pay | Admitting: Physical Therapy

## 2019-02-15 DIAGNOSIS — R293 Abnormal posture: Secondary | ICD-10-CM | POA: Diagnosis present

## 2019-02-15 DIAGNOSIS — R29818 Other symptoms and signs involving the nervous system: Secondary | ICD-10-CM | POA: Diagnosis present

## 2019-02-15 DIAGNOSIS — R2681 Unsteadiness on feet: Secondary | ICD-10-CM | POA: Insufficient documentation

## 2019-02-15 DIAGNOSIS — I69354 Hemiplegia and hemiparesis following cerebral infarction affecting left non-dominant side: Secondary | ICD-10-CM | POA: Diagnosis present

## 2019-02-15 DIAGNOSIS — R29898 Other symptoms and signs involving the musculoskeletal system: Secondary | ICD-10-CM | POA: Insufficient documentation

## 2019-02-15 DIAGNOSIS — R296 Repeated falls: Secondary | ICD-10-CM | POA: Diagnosis not present

## 2019-02-15 DIAGNOSIS — M25642 Stiffness of left hand, not elsewhere classified: Secondary | ICD-10-CM | POA: Insufficient documentation

## 2019-02-15 DIAGNOSIS — R2689 Other abnormalities of gait and mobility: Secondary | ICD-10-CM | POA: Insufficient documentation

## 2019-02-15 DIAGNOSIS — R262 Difficulty in walking, not elsewhere classified: Secondary | ICD-10-CM | POA: Insufficient documentation

## 2019-02-15 DIAGNOSIS — M25612 Stiffness of left shoulder, not elsewhere classified: Secondary | ICD-10-CM

## 2019-02-15 DIAGNOSIS — M6281 Muscle weakness (generalized): Secondary | ICD-10-CM

## 2019-02-15 NOTE — Therapy (Addendum)
[facility] [address] [place], [place], [address] Phone: [phone]   Fax:  [phone]  Occupational Therapy Evaluation  Patient Details  Name: [name] MRN: [id number] Date of Birth: 18-Feb-1961 Referring Provider (OT): Dr, Jeanie Cooks   Encounter Date: 02/15/2019  OT End of Session - 02/15/19 0953    Visit Number  1    Number of Visits  6   eval plus 5 visits   Date for OT Re-Evaluation  04/05/19    Authorization Type  Medicaid - await approval for first three visits and then will submit for remaining 2 visits.    Authorization - Visit Number  1    Authorization - Number of Visits  6    OT Start Time  0801    OT Stop Time  0102    OT Time Calculation (min)  43 min    Activity Tolerance  Patient tolerated treatment well       Past Medical History:  Diagnosis Date  . Arthritis   . 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 COPD (chronic obstructive pulmonary disease) ([place])   . GERD (gastroesophageal reflux disease)   . 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 History of gout   . History of kidney stones   . Hyperlipidemia   . Hypertension   . Prostate cancer [facility]) urologist-- dr Darene Lamer. [facility];  oncologist-  dr [name]   dx 07-20-2017  . Renal calculi   . Stroke ([place]) sept 26,  20006    hx mini strokes in past, 1 large stroke  . Weakness of left side of body 03/2005   cva residual  . Weakness of one side of body    L sided weakness after stroke    Past Surgical History:  Procedure Laterality Date  . COLONOSCOPY WITH PROPOFOL N/A 09/27/2015   Procedure: COLONOSCOPY WITH PROPOFOL;  Surgeon: [name], MD;  Location: [facility];  Service: Endoscopy;  Laterality: N/A;  . CYSTOSCOPY N/A 10/05/2012   Procedure: CYSTOSCOPY, retrograde and right stent placement;  Surgeon:  [name], MD;  Location: [facility];  Service: Urology;  Laterality: N/A;  . CYSTOSCOPY WITH RETROGRADE PYELOGRAM, URETEROSCOPY AND STENT PLACEMENT Right 04/13/2014   Procedure: CYSTOSCOPY WITH RETROGRADE PYELOGRAM, URETEROSCOPY AND STENT PLACEMENT, LASER ABLATION OF SCAR TISSUE;  Surgeon: [name], MD;  Location: [facility];  Service: Urology;  Laterality: Right;  . EYE SURGERY Left age 49  . HOLMIUM LASER APPLICATION Right 72/53/6644   Procedure: HOLMIUM LASER APPLICATION;  Surgeon: [name], MD;  Location: [facility];  Service: Urology;  Laterality: Right;  . NEPHROLITHOTOMY Right 10/05/2012   Procedure: NEPHROLITHOTOMY PERCUTANEOUS;  Surgeon: [name], MD;  Location: [facility];  Service: Urology;  Laterality: Right;  WITH CYSTO AND SURGEON ACCESS   . NEPHROLITHOTOMY Right 10/07/2012   Procedure: NEPHROLITHOTOMY PERCUTANEOUS SECOND LOOK. Stent exchange,Ureteroscopy ;  Surgeon: [name], MD;  Location: [facility];  Service: Urology;  Laterality: Right;  . RADIOACTIVE SEED IMPLANT N/A 11/12/2018   Procedure: RADIOACTIVE SEED IMPLANT/BRACHYTHERAPY IMPLANT;  Surgeon: [name], MD;  Location: [facility];  Service: Urology;  Laterality: N/A;  90 MINS  . SPACE OAR INSTILLATION N/A 11/12/2018   Procedure: SPACE OAR INSTILLATION;  Surgeon: [name], MD;  Location: [facility];  Service: Urology;  Laterality: N/A;  . TOENAIL EXCISION  There were no vitals filed for this visit.  Subjective Assessment - 02/15/19 0801    Subjective   It seems like my left side is getting weaker    Pertinent History  R CVA in 03/26/2005.  PMH:    Patient Stated Goals  I want to get stronger    Currently in Pain?  No/denies        [facility] OT Assessment - 02/15/19 0802      Assessment   Medical Diagnosis  Late effects of R CVA    Referring Provider (OT)  Dr, Jeanie Cooks    Onset Date/Surgical Date  02/10/19   referral date; CVA on 03/26/2005   Hand Dominance  Right    Prior Therapy  Pt has Pt and OT  previously at this clinic      Precautions   Precautions  Fall      Restrictions   Weight Bearing Restrictions  No      Balance Screen   Has the patient fallen in the past 6 months  Yes   Pt was seen for PT eval today   How many times?  --   several falls since [place] expects to be discharged to:  Private residence    Living Arrangements  Alone    Available Help at Discharge  Available PRN/intermittently   aide 2 hours 7 days per week to assist with self care   Type of [place]  One level    Bathroom Shower/Tub  Tub/Shower unit    [employer]  Standard    Additional Comments  Pt has transfer tub bench and hand held shower as well as a raised seat on toilet.  No grab bars      Prior Function   Level of Independence  Needs assistance with ADLs;Independent with household mobility with device    Leisure  likes to play games on his phone      ADL   Eating/Feeding  Modified independent   aide cuts up food for meals   Grooming  Modified independent    Upper Body Bathing  Minimal assistance    Lower Body Bathing  Minimal assistance    Upper Body Dressing  Minimal assistance    Lower Body Dressing  Minimal assistance   socks and shoes   Toilet Transfer  Modified independent   uses urinal at night, uses straight cane right now   Toileting - Clothing Manipulation  Modified independent    [place] Transfer  Min guard      IADL   Shopping  Takes care of all shopping needs independently   uses motorized cart   Light Housekeeping  Needs help with all home maintenance tasks   aide cleans and does laundry   Meal Prep  Plans, prepares and serves adequate meals independently    Investment banker, corporate own vehicle   pt cleared to drive by Dr Jeanie Cooks per pt   Medication Management  Is responsible for taking medication in correct dosages at correct time    Consulting civil engineer financial matters independently (budgets, writes checks, pays rent, bills goes to bank), collects and keeps track of income      Mobility   Mobility Status  History of falls    Mobility Status Comments  Pt was using power wheelchair that "stopped working" in December  2019 so pt currently using straight cane. Pt reports that someone came to see it and replaced switch but still not working.  Medicaid has approved new powerchair and chair supposed to be delivered to pt "sometime this week."       Written Expression   Dominant Hand  Right      Vision - History   Baseline Vision  Other (comment)    Additional Comments  Pt is blind in L eye but reports 20/20 vision in R eye.  Pt states MD has cleared him to drive.      Activity Tolerance   Activity Tolerance  Tolerate 30+ min activity without fatigue    Activity Tolerance Comments  Pt can do bathing and dressing with aide's help but then needs to take a rest.       Cognition   Overall Cognitive Status  Within Functional Limits for tasks assessed      Posture/Postural Control   Posture/Postural Control  Postural limitations    Postural Limitations  Rounded Shoulders;Posterior pelvic tilt    Posture Comments  L shoulder girdle malalignment - to be further assessed      Sensation   Light Touch  Impaired by gross assessment    Hot/Cold  Impaired by gross assessment      Coordination   Gross Motor Movements are Fluid and Coordinated  No    Fine Motor Movements are Fluid and Coordinated  No    Other  Unable to assess coordination due to very limited movement in L hand      Edema   Edema  mild in L hand      Tone   Assessment Location  Left Upper Extremity      ROM / Strength   AROM / PROM / Strength  PROM;AROM      AROM   Overall AROM   Deficits    Overall AROM Comments  Pt has very limited finger flexion and extension in thumb, index and middle finger of L hand. Pt can initiate elbow extension and flexion with  starting point at 90* of elbow flexion.      PROM   Overall PROM   Deficits    Overall PROM Comments  LUE: shoulder flexion/abduction to 90*, full elbow extension/flexion, full supination/pronation, full ER/IR, wrist extension to neutral only.  Pt has contractures at approximately 95* at MCP's of all digits.        Strength   Overall Strength Comments  Unable to assess strength in LUE due to severe spasticity however pt has very little functional movement in LUE.       Hand Function   Right Hand Gross Grasp  Functional    Left Hand Gross Grasp  Impaired    Comment  Pt states at times he can pinch something with L hand to hold it or place something in his L hand to stabilize it.        LUE Tone   LUE Tone  Severe;Modified Ashworth      LUE Tone   Modified Ashworth Scale for Grading Hypertonia LUE  Considerable increase in muschle tone, passive movement difficult                        OT Short Term Goals - 02/15/19 0941      OT SHORT TERM GOAL #1   Title  Pt will be mod I for basic HEP for LUE - 03/29/2019 (date adjusted to allow  for Medicaid approval)    Baseline  dependent    Status  New      OT SHORT TERM GOAL #2   Title  Pt will be mod I for splint wear and care    Baseline  dependent    Status  New        OT Long Term Goals - 02/15/19 0109      OT LONG TERM GOAL #1   Title  Pt will be mod I with upgraded HEP for LUE - 04/05/2019 (date adjusted to allow for Medicaid approval)    Baseline  dependent    Status  New      OT LONG TERM GOAL #2   Title  Pt will demonstate understanding of spasticity mgmt techniques for LUE    Baseline  dependent    Status  New      OT LONG TERM GOAL #4   Title  Pt will demonstrate ability for shoulder flexion to 100* with 0/10 pain for HEP    Baseline  90* of shoulder flexion    Status  New            Plan - 02/15/19 0943    Clinical Impression Statement  Pt is a 58 year old male s/p R CVA in 2006 who is  recently referred to OT for late effects of CVA. PMH: HTN, HLD, prostate cancer, COPD, blind in L eye. OA and gout.  Pt presents with the following deficits that impact independence and care of LUE:  abnormal posture, decreased PROM, signifcant LUE spasticity, impaired sensation, decreased balance. Given limited visits, PT to address balance and functional mobility and OT to address LUE impairments. P twill benefit from skilled OT to maximize independence and care of LUE.    OT Occupational Profile and History  Detailed Assessment- Review of Records and additional review of physical, cognitive, psychosocial history related to current functional performance    Occupational performance deficits (Please refer to evaluation for details):  ADL's;IADL's    Body Structure / Function / Physical Skills  ADL;Edema;Sensation;ROM    Rehab Potential  Good   for goals   Clinical Decision Making  Limited treatment options, no task modification necessary    Comorbidities Affecting Occupational Performance:  May have comorbidities impacting occupational performance    Modification or Assistance to Complete Evaluation   Min-Moderate modification of tasks or assist with assess necessary to complete eval    OT Frequency  1x / week    OT Duration  Other (comment)   5 weeks   OT Treatment/Interventions  Self-care/ADL training;Therapeutic exercise;Neuromuscular education;Passive range of motion;Splinting;Therapeutic activities;Patient/family education    Plan  initiate HEP, splint for LUE, address spasticity    Consulted and Agree with Plan of Care  Patient       Patient will benefit from skilled therapeutic intervention in order to improve the following deficits and impairments:   Body Structure / Function / Physical Skills: ADL, Edema, Sensation, ROM       Visit Diagnosis: 1. Abnormal posture   2. Other symptoms and signs involving the nervous system   3. Other symptoms and signs involving the musculoskeletal  system   4. Stiffness of left shoulder, not elsewhere classified   5. Stiffness of left hand, not elsewhere classified       Problem List Patient Active Problem List   Diagnosis Date Noted  . Malignant neoplasm of prostate ([place]) 08/19/2018    Quay Burow, OTR/L 02/15/2019, 9:59 AM  [facility] [address] [place], [place], [address] Phone: [phone]   Fax:  [phone]  Name: [name] MRN: [id number] Date of Birth: 03/14/1961

## 2019-02-15 NOTE — Therapy (Addendum)
[facility] [address] [place], [place], [address] Phone: [phone]   Fax:  [phone]  Physical Therapy Evaluation  Patient Details  Name: [name] MRN: [id number] Date of Birth: 18-Nov-1960 Referring Provider (PT): Ezekiel Ina, MD   Encounter Date: 02/15/2019  PT End of Session - 02/15/19 1245    Visit Number  1    Number of Visits  13 eval plus 12 visits   Date for PT Re-Evaluation  04/05/19    Authorization Type  Medicaid - awaiting approval of 16 visits    Authorization - Visit Number  --    Authorization - Number of Visits  --    PT Start Time  0715    PT Stop Time  0800    PT Time Calculation (min)  45 min    Equipment Utilized During Treatment  Gait belt    Activity Tolerance  Patient limited by fatigue    Behavior During Therapy  [facility] for tasks assessed/performed       Past Medical History:  Diagnosis Date  . Arthritis   . 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 COPD (chronic obstructive pulmonary disease) ([place])   . GERD (gastroesophageal reflux disease)   . 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 History of gout   . History of kidney stones   . Hyperlipidemia   . Hypertension   . Prostate cancer [facility]) urologist-- dr Darene Lamer. [facility];  oncologist-  dr [name]   dx 07-20-2017  . Renal calculi   . Stroke ([place]) sept 26,  20006    hx mini strokes in past, 1 large stroke  . Weakness of left side of body 03/2005   cva residual  . Weakness of one side of body    L sided weakness after stroke    Past Surgical History:  Procedure Laterality Date  . COLONOSCOPY WITH PROPOFOL N/A 09/27/2015   Procedure: COLONOSCOPY WITH PROPOFOL;  Surgeon: [name], MD;  Location: [facility];  Service: Endoscopy;  Laterality: N/A;  . CYSTOSCOPY N/A 10/05/2012   Procedure:  CYSTOSCOPY, retrograde and right stent placement;  Surgeon: [name], MD;  Location: [facility];  Service: Urology;  Laterality: N/A;  . CYSTOSCOPY WITH RETROGRADE PYELOGRAM, URETEROSCOPY AND STENT PLACEMENT Right 04/13/2014   Procedure: CYSTOSCOPY WITH RETROGRADE PYELOGRAM, URETEROSCOPY AND STENT PLACEMENT, LASER ABLATION OF SCAR TISSUE;  Surgeon: [name], MD;  Location: [facility];  Service: Urology;  Laterality: Right;  . EYE SURGERY Left age 53  . HOLMIUM LASER APPLICATION Right 56/43/3295   Procedure: HOLMIUM LASER APPLICATION;  Surgeon: [name], MD;  Location: [facility];  Service: Urology;  Laterality: Right;  . NEPHROLITHOTOMY Right 10/05/2012   Procedure: NEPHROLITHOTOMY PERCUTANEOUS;  Surgeon: [name], MD;  Location: [facility];  Service: Urology;  Laterality: Right;  WITH CYSTO AND SURGEON ACCESS   . NEPHROLITHOTOMY Right 10/07/2012   Procedure: NEPHROLITHOTOMY PERCUTANEOUS SECOND LOOK. Stent exchange,Ureteroscopy ;  Surgeon: [name], MD;  Location: [facility];  Service: Urology;  Laterality: Right;  . RADIOACTIVE SEED IMPLANT N/A 11/12/2018   Procedure: RADIOACTIVE SEED IMPLANT/BRACHYTHERAPY IMPLANT;  Surgeon: [name], MD;  Location: [facility];  Service: Urology;  Laterality: N/A;  90 MINS  . SPACE OAR INSTILLATION N/A 11/12/2018   Procedure: SPACE OAR INSTILLATION;  Surgeon: [name], MD;  Location: [facility];  Service: Urology;  Laterality: N/A;  . TOENAIL EXCISION      There were no vitals filed for this visit.     02/15/19 0719  Symptoms/Limitations  Subjective Has fallen 4 times. Left leg gives out. Most recent one was about a month ago - was going out to his mailbox and his leg gave out. A neighbor saw him fall and was able to help him up outside. Reports before he falls his L leg gets numb, he is unable to put any weight on it and then he falls. Has not been able to use his power w/c because the power is not working. His left leg is feeling more cold and is  getting worse as he is getting older.  Pertinent History R CVA (03/26/2005); blind in L eye and low vision in R eye; HLD, HTN, , COPD, prostate cancer  Limitations Walking;Standing  How long can you walk comfortably? to his mailbox (about 50 ft)  Patient Stated Goals wants to get his L leg working again  Pain Assessment  Currently in Pain? No/denies      [facility] PT Assessment - 02/15/19 0725      Assessment   Medical Diagnosis  Late effects of R CVA, frequent falls     Referring Provider (PT)  Ezekiel Ina, MD    Onset Date/Surgical Date  02/10/19   referral date; CVA on 03/26/2005   Hand Dominance  Right    Prior Therapy  previous PT/OT at this clinic      Precautions   Precautions  Fall      Balance Screen   Has the patient fallen in the past 6 months  Yes    How many times?  2    Has the patient had a decrease in activity level because of a fear of falling?   Yes    Is the patient reluctant to leave their home because of a fear of falling?   Yes      [place] residence    Living Arrangements  Alone   has an aide 7 days; 1 hr 45 mins a day    Available Help at Discharge  Available PRN/intermittently   aide    Type of [place] to enter    Entrance Stairs-Number of Steps  3-4    in front, ramp in back    Entrance Stairs-Rails  None    Home Layout  One level    [place] - single point;Wheelchair - power;Tub bench   currently w/c is not working    Additional Comments  Has not used power w/c since chirstmas d/t reported battery/swtich not working. Medicaid has approved new powerchair and chair supposed to be delivered to pt "sometime this week." . Has AFO for LLE, has not been wearing it for the past month because it doesn't fit in his shoes      Prior Function   Level of Independence  Needs assistance with ADLs;Independent with household mobility with device    Leisure  likes to play games on his  phone       Sensation   Light Touch  Impaired by gross assessment    Additional Comments  increased time to respond to light touch on LLE, more difficulty localizing      Coordination   Gross Motor Movements are Fluid and Coordinated  No    Coordination  and Movement Description  unable to perform LLE due to to weakness and increased tone       Posture/Postural Control   Posture/Postural Control  Postural limitations    Postural Limitations  Rounded Shoulders;Forward head;Posterior pelvic tilt      Tone   Assessment Location  Left Lower Extremity      ROM / Strength   AROM / PROM / Strength  Strength;AROM      AROM   Overall AROM Comments  Limited on L secondary to decreased strength      Strength   Overall Strength Comments  --    Strength Assessment Site  Hip;Knee;Ankle    Right/Left Hip  Right;Left    Right Hip Flexion  4/5    Left Hip Flexion  2+/5    Right/Left Knee  Right;Left    Right Knee Flexion  4/5    Right Knee Extension  4/5    Left Knee Flexion  2/5    Left Knee Extension  2/5    Right/Left Ankle  Right;Left    Right Ankle Dorsiflexion  4/5    Left Ankle Dorsiflexion  1/5    Left Ankle Plantar Flexion  0/5      Transfers   Comments  30 second chair stand: able to perform 2 from chair with single UE from RUE, needs min guard from therapist      Ambulation/Gait   Ambulation/Gait  Yes    Ambulation/Gait Assistance  4: Min assist;4: Min guard    Ambulation Distance (Feet)  100 Feet   approx. throughout session during eval   Assistive device  Straight cane    Gait Pattern  Decreased stance time - left;Decreased step length - left;Decreased arm swing - left;Decreased stride length;Decreased weight shift to left;Decreased dorsiflexion - left;Decreased hip/knee flexion - left;Trunk flexed;Poor foot clearance - left;Step-to pattern;Wide base of support;Left circumduction    Ambulation Surface  Level;Indoor      Balance   Balance Assessed  Yes       Standardized Balance Assessment   Standardized Balance Assessment  Timed Up and Go Test      Timed Up and Go Test   Normal TUG (seconds)  59.16    TUG Comments  high fall risk, using single point cane       LUE Tone   LUE Tone  --      LLE Tone   LLE Tone  Hypertonic                Objective measurements completed on examination: See above findings.              PT Education - 02/15/19 1245    Education Details  clinical findings, POC    Person(s) Educated  Patient    Methods  Explanation    Comprehension  Verbalized understanding;Need further instruction       PT Short Term Goals - 02/15/19 1300      PT SHORT TERM GOAL #1   Title  Patient will be independent with initial HEP for balance and LE strenghtening in order to build upon functional gains in therapy. ALL STGS DUE BY 6TH VISIT.    Baseline  currently dependent    Time  6    Period  --   6th visit   Status  New    Target Date  --      PT SHORT TERM GOAL #2   Title  Patient will decrease TUG score using  single point cane to at least 53 seconds in order to decrease risk of falls.    Baseline  59.16 seconds on 02/15/19    Time  --    Period  --    Status  New      PT SHORT TERM GOAL #3   Title  Patient will ambulate 41' with supervision with single point cane in order to improve mobility for household distances.    Baseline  min guard/ min A with single point cane on 02/15/19    Time  --    Period  --    Status  New      PT SHORT TERM GOAL #4   Title  Patient will perform w/c mobility with power w/c outside over paved/cement surfaces for 250' with mod I in order to improve community mobility and safety.    Baseline  not yet assessed; pt states he will be receiving new power w/c this week    Time  --    Period  --    Status  New      PT SHORT TERM GOAL #5   Title  Patient will tolerate static standing balance x3 minutes at countertop with supervision in order to increase endurance for  ADLs.    Baseline  not yet assessed.    Time  --    Period  --    Status  New        PT Long Term Goals - 02/15/19 1304      PT LONG TERM GOAL #1   Title  Patient will be independent with initial HEP for balance and LE strengthening in order to build upon functional gains in therapy. ALL LTGs DUE 04/05/19    Baseline  dependent with HEP    Time  6    Period  Weeks    Status  New    Target Date  04/05/19      PT LONG TERM GOAL #2   Title  Patient will ambulate 41' with supervision with single point cane in order to improve mobility for household distances.    Baseline  min guard/ min A with single point cane on 02/15/19    Time  --    Period  --    Status  New      PT LONG TERM GOAL #3   Title  Patient will tolerate static standing balance x5 minutes at countertop with supervision in order to increase endurance for ADLs.    Baseline  not yet assessed    Time  --    Period  --    Status  New      PT LONG TERM GOAL #4   Title  Patient will decrease TUG score using single point cane to at least 49 seconds in order to decrease risk of falls.    Baseline  59.16 seconds on 02/15/19    Time  --    Period  --    Status  New      PT LONG TERM GOAL #5   Title  Patient will perform at least 4 sit <> stands from arm chair with UE support during 30 second chair in order to demonstrate improved LE strength.    Baseline  2 sit <> stands during 30 seconds    Time  --    Period  --    Status  New      Additional Long Term Goals   Additional Long Term Goals  Yes      PT LONG TERM GOAL #6   Title  Patient will perform w/c mobility with power w/c outside over paved/cement surfaces and around obstacles for 500' with mod I in order to improve community mobility and safety.    Baseline  not yet assessed    Time  --    Period  --    Status  New      PT LONG TERM GOAL #7   Title  Patient will perform stand pivot t/f from power w/c <> mat table using single point cane and mod I in order  to improve safety at home.    Baseline  not yet assessed    Time  --    Period  --    Status  New             Plan - 02/15/19 1250    Clinical Impression Statement  Pt is a 58 year old male referred to Neuro OPPT for evaluation s/p R CVA in 2006 who is recently referred  for late effects of CVA and frequent falls. Pt's PMH is significant for: HTN, HLD, prostate cancer, COPD, blind in L eye. OA and gout.  The following deficits were present during the exam: impaired balance, increased tone of LLE, decreased LLE strength, difficulty walking, decreased endurance, abnormal gait, and decreased AROM. Pt's TUG scores indicate pt is at a high risk for falls. Due to decreased endurance and safety while ambulating with single point cane, was unable to perform additional outcome measures at PT evaluation today. Pt would benefit from skilled PT to address these impairments and functional limitations to maximize functional mobility independence.    Personal Factors and Comorbidities  Comorbidity 3+;Past/Current Experience;Time since onset of injury/illness/exacerbation    Comorbidities  HTN, HLD, prostate cancer, COPD, blind in L eye. OA and gout.    Examination-Activity Limitations  Locomotion Level;Stand;Transfers    Stability/Clinical Decision Making  Evolving/Moderate complexity    Clinical Decision Making  Moderate    Rehab Potential  Fair    PT Frequency  2x / week    PT Duration  6 weeks    PT Treatment/Interventions  ADLs/Self Care Home Management;DME Instruction;Gait training;Functional mobility training;Neuromuscular re-education;Balance training;Therapeutic exercise;Therapeutic activities;Patient/family education;Wheelchair mobility training;Passive range of motion;Orthotic Fit/Training;Energy conservation    PT Next Visit Plan  has patient gotten their power w/c delivered? stand pivot/ stand step transfers from w/c > mat table with SPC. Initial supine/seated HEP for LE strength/balance.  Standing balance at counter top for ADLs.    Consulted and Agree with Plan of Care  Patient       Patient will benefit from skilled therapeutic intervention in order to improve the following deficits and impairments:  Abnormal gait, Decreased activity tolerance, Decreased balance, Decreased endurance, Decreased coordination, Decreased mobility, Decreased range of motion, Difficulty walking, Decreased strength, Impaired tone, Postural dysfunction  Visit Diagnosis: 1. Repeated falls   2. Unsteadiness on feet   3. Other abnormalities of gait and mobility   4. Muscle weakness (generalized)   5. Difficulty in walking, not elsewhere classified   6. Hemiplegia and hemiparesis following cerebral infarction affecting left non-dominant side [facility])        Problem List Patient Active Problem List   Diagnosis Date Noted  . Malignant neoplasm of prostate ([place]) 08/19/2018    Arliss Journey, PT, DPT 02/15/2019, 4:21 PM  [facility] [address] [place], [place], [address] Phone: [phone]  Fax:  [phone]  Name: BOW BUNTYN MRN: [id number] Date of Birth: 1960/10/14

## 2019-02-17 NOTE — Addendum Note (Signed)
Addended by: Arliss Journey on: 02/17/2019 11:37 AM   Modules accepted: Orders

## 2019-03-01 ENCOUNTER — Ambulatory Visit: Payer: Medicaid Other | Admitting: Physical Therapy

## 2019-03-01 ENCOUNTER — Encounter: Payer: Self-pay | Admitting: Occupational Therapy

## 2019-03-01 ENCOUNTER — Encounter: Payer: Self-pay | Admitting: Physical Therapy

## 2019-03-01 ENCOUNTER — Other Ambulatory Visit: Payer: Self-pay

## 2019-03-01 ENCOUNTER — Ambulatory Visit: Payer: Medicaid Other | Admitting: Occupational Therapy

## 2019-03-01 VITALS — BP 152/88 | HR 87

## 2019-03-01 DIAGNOSIS — M25642 Stiffness of left hand, not elsewhere classified: Secondary | ICD-10-CM

## 2019-03-01 DIAGNOSIS — M25612 Stiffness of left shoulder, not elsewhere classified: Secondary | ICD-10-CM

## 2019-03-01 DIAGNOSIS — R296 Repeated falls: Secondary | ICD-10-CM | POA: Diagnosis not present

## 2019-03-01 DIAGNOSIS — R29818 Other symptoms and signs involving the nervous system: Secondary | ICD-10-CM

## 2019-03-01 DIAGNOSIS — I69354 Hemiplegia and hemiparesis following cerebral infarction affecting left non-dominant side: Secondary | ICD-10-CM

## 2019-03-01 DIAGNOSIS — R262 Difficulty in walking, not elsewhere classified: Secondary | ICD-10-CM

## 2019-03-01 DIAGNOSIS — R2681 Unsteadiness on feet: Secondary | ICD-10-CM

## 2019-03-01 DIAGNOSIS — M6281 Muscle weakness (generalized): Secondary | ICD-10-CM

## 2019-03-01 DIAGNOSIS — R293 Abnormal posture: Secondary | ICD-10-CM

## 2019-03-01 DIAGNOSIS — R29898 Other symptoms and signs involving the musculoskeletal system: Secondary | ICD-10-CM

## 2019-03-01 NOTE — Therapy (Signed)
[facility] [address] [place], [place], [address] Phone: [phone]   Fax:  [phone]  Physical Therapy Treatment  Patient Details  Name: [name] MRN: [id number] Date of Birth: [birth year] Referring Provider (PT): Ezekiel Ina, MD   Encounter Date: 03/01/2019  PT End of Session - 03/01/19 0943    Visit Number  2    Number of Visits  13   eval plus 12 visits   Date for PT Re-Evaluation  04/05/19    Authorization Type  Medicaid - awaiting approval of 12 visits    Authorization - Visit Number  1    Authorization - Number of Visits  12    PT Start Time  0716    PT Stop Time  0758    PT Time Calculation (min)  42 min    Activity Tolerance  Patient tolerated treatment well    Behavior During Therapy  [facility] for tasks assessed/performed       Past Medical History:  Diagnosis Date  . Arthritis   . 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 COPD (chronic obstructive pulmonary disease) ([place])   . GERD (gastroesophageal reflux disease)   . 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 History of gout   . History of kidney stones   . Hyperlipidemia   . Hypertension   . Prostate cancer [facility]) urologist-- dr Darene Lamer. Tresa Moore;  oncologist-  dr [name]   dx 07-20-2017  . Renal calculi   . Stroke ([place]) sept 26,  20006    hx mini strokes in past, 1 large stroke  . Weakness of left side of body 03/2005   cva residual  . Weakness of one side of body    L sided weakness after stroke    Past Surgical History:  Procedure Laterality Date  . COLONOSCOPY WITH PROPOFOL N/A 09/27/2015   Procedure: COLONOSCOPY WITH PROPOFOL;  Surgeon: [name], MD;  [facility];  Service: Endoscopy;  Laterality: N/A;  . CYSTOSCOPY N/A 10/05/2012   Procedure: CYSTOSCOPY, retrograde and right stent placement;   Surgeon: [name], MD;  [facility];  Service: Urology;  Laterality: N/A;  . CYSTOSCOPY WITH RETROGRADE PYELOGRAM, URETEROSCOPY AND STENT PLACEMENT Right 04/13/2014   Procedure: CYSTOSCOPY WITH RETROGRADE PYELOGRAM, URETEROSCOPY AND STENT PLACEMENT, LASER ABLATION OF SCAR TISSUE;  Surgeon: [name], MD;  [facility];  Service: Urology;  Laterality: Right;  . EYE SURGERY Left age 4  . HOLMIUM LASER APPLICATION Right [id number]   Procedure: HOLMIUM LASER APPLICATION;  Surgeon: [name], MD;  [facility];  Service: Urology;  Laterality: Right;  . NEPHROLITHOTOMY Right 10/05/2012   Procedure: NEPHROLITHOTOMY PERCUTANEOUS;  Surgeon: [name], MD;  [facility];  Service: Urology;  Laterality: Right;  WITH CYSTO AND SURGEON ACCESS   . NEPHROLITHOTOMY Right 10/07/2012   Procedure: NEPHROLITHOTOMY PERCUTANEOUS SECOND LOOK. Stent exchange,Ureteroscopy ;  Surgeon: [name], MD;  [facility];  Service: Urology;  Laterality: Right;  . RADIOACTIVE SEED IMPLANT N/A 11/12/2018   Procedure: RADIOACTIVE SEED IMPLANT/BRACHYTHERAPY IMPLANT;  Surgeon: [name], MD;  [facility];  Service: Urology;  Laterality: N/A;  90 MINS  . SPACE OAR INSTILLATION N/A 11/12/2018   Procedure: SPACE OAR INSTILLATION;  Surgeon: [name], MD;  [facility];  Service: Urology;  Laterality: N/A;  .  TOENAIL EXCISION      Vitals:   03/01/19 0729  BP: (!) 152/88  Pulse: 87    Subjective Assessment - 03/01/19 0720    Subjective  No almost falls/falls. Finally got his w/c- does not like it because it is a cheaper model. Got it from [facility]. The wheels are lower and smaller, has a joystick. Has been using it to go out to the mailbox. Does not use his w/c around the house d/t having too many turns to go around. Not able to bring it in to session d/t pt driving to and from therapy.   Pertinent History  R CVA (03/26/2005); blind in L eye and low vision  in R eye; HLD, HTN, , COPD, prostate cancer    Limitations  Walking;Standing    How long can you walk comfortably?  to his mailbox (about 50 ft)    Patient Stated Goals  wants to get his L leg working again                       [facility] Adult PT Treatment/Exercise - 03/01/19 0944      Bed Mobility   Bed Mobility  Rolling Right;Sit to Supine;Supine to Sit    Rolling Right  Supervision/verbal cueing    Supine to Sit  Supervision/Verbal cueing    Sit to Supine  Independent      Transfers   Transfers  Stand to Sit;Sit to Stand    Sit to Stand  5: Supervision    Stand to Sit  5: Supervision    Comments  Stand step transfer x2 from chair with arms <> mat table and use of SPC - cueing for LE placement, min guard      Neuro Re-ed    Neuro Re-ed Details   Supine on mat table, 4 x 5 reps bridging - cueing for technique, therapist providing manual facilitation through LLE for weight bearing. Mod A for LLE to get into position for bridging. In supine 1 x 10 hip ADD pillow squeezes - provided seated hip ADD pillow squeezes for HEP. Bent knee fall outs 2 x 5 reps B, cueing to keep LLE stable with intermittent manual cues from therapist when performing bent knee fall outs on R.        Exercises   Exercises  Knee/Hip;Ankle      Ankle Exercises: Stretches   Gastroc Stretch  4 reps;30 seconds    Gastroc Stretch Limitations  with pt lying supine with LLE straight, therapist assisted stretch, pt limited to approx. neutral DF with passive over pressure from therapist          Access Code: [place]: [email or web address]  Date: 03/01/2019  Prepared by: Janann August   Exercises Supine Bridge - 5 reps - 3 sets - 1x daily - 5x weekly Seated Hip Adduction Isometrics with Ball - 5 reps - 3 sets - 1x daily - 5x weekly     PT Education - 03/01/19 0942    Education Details  initial HEP for LE strengthening, educated pt to have pt's aide take pics of pt in w/c to  bring to therapy (d/t pt not being able to bring in his wheelchair), bring in L AFO to next visit.    Person(s) Educated  Patient    Methods  Explanation;Demonstration;Handout    Comprehension  Verbalized understanding;Returned demonstration       PT Short Term Goals - 02/15/19 1300  PT SHORT TERM GOAL #1   Title  Patient will be independent with initial HEP for balance and LE strenghtening in order to build upon functional gains in therapy. ALL STGS DUE BY 6TH VISIT.    Baseline  currently dependent    Time  6    Period  --   6th visit   Status  New    Target Date  --      PT SHORT TERM GOAL #2   Title  Patient will decrease TUG score using single point cane to at least 53 seconds in order to decrease risk of falls.    Baseline  59.16 seconds on 02/15/19    Time  --    Period  --    Status  New      PT SHORT TERM GOAL #3   Title  Patient will ambulate 39' with supervision with single point cane in order to improve mobility for household distances.    Baseline  min guard/ min A with single point cane on 02/15/19    Time  --    Period  --    Status  New      PT SHORT TERM GOAL #4   Title  Patient will perform w/c mobility with power w/c outside over paved/cement surfaces for 250' with mod I in order to improve community mobility and safety.    Baseline  not yet assessed; pt states he will be receiving new power w/c this week    Time  --    Period  --    Status  New      PT SHORT TERM GOAL #5   Title  Patient will tolerate static standing balance x3 minutes at countertop with supervision in order to increase endurance for ADLs.    Baseline  not yet assessed.    Time  --    Period  --    Status  New        PT Long Term Goals - 02/15/19 1304      PT LONG TERM GOAL #1   Title  Patient will be independent with initial HEP for balance and LE strengthening in order to build upon functional gains in therapy. ALL LTGs DUE 04/05/19    Baseline  dependent with HEP    Time   6    Period  Weeks    Status  New    Target Date  04/05/19      PT LONG TERM GOAL #2   Title  Patient will ambulate 58' with supervision with single point cane in order to improve mobility for household distances.    Baseline  min guard/ min A with single point cane on 02/15/19    Time  --    Period  --    Status  New      PT LONG TERM GOAL #3   Title  Patient will tolerate static standing balance x5 minutes at countertop with supervision in order to increase endurance for ADLs.    Baseline  not yet assessed    Time  --    Period  --    Status  New      PT LONG TERM GOAL #4   Title  Patient will decrease TUG score using single point cane to at least 49 seconds in order to decrease risk of falls.    Baseline  59.16 seconds on 02/15/19    Time  --    Period  --  Status  New      PT LONG TERM GOAL #5   Title  Patient will perform at least 4 sit <> stands from arm chair with UE support during 30 second chair in order to demonstrate improved LE strength.    Baseline  2 sit <> stands during 30 seconds    Time  --    Period  --    Status  New      Additional Long Term Goals   Additional Long Term Goals  Yes      PT LONG TERM GOAL #6   Title  Patient will perform w/c mobility with power w/c outside over paved/cement surfaces and around obstacles for 500' with mod I in order to improve community mobility and safety.    Baseline  not yet assessed    Time  --    Period  --    Status  New      PT LONG TERM GOAL #7   Title  Patient will perform stand pivot t/f from power w/c <> mat table using single point cane and mod I in order to improve safety at home.    Baseline  not yet assessed    Time  --    Period  --    Status  New            Plan - 03/01/19 1006    Clinical Impression Statement  Focus of today's session was supine ther-ex and beginnings of initial HEP. Pt tolerated well, therapist instructing pt to have aide perform with pt and home - to assist with set up of  bridging exercise. Due to pt being unable to bring his w/c with him to therapy sessions, instructed pt to have aide take a picture of him seated in his chair to bring to next therapy session. Will continue to progress towards LTGs.    Personal Factors and Comorbidities  Comorbidity 3+;Past/Current Experience;Time since onset of injury/illness/exacerbation    Comorbidities  HTN, HLD, prostate cancer, COPD, blind in L eye. OA and gout.    Examination-Activity Limitations  Locomotion Level;Stand;Transfers    Stability/Clinical Decision Making  Evolving/Moderate complexity    Rehab Potential  Fair    PT Frequency  2x / week    PT Duration  6 weeks    PT Treatment/Interventions  ADLs/Self Care Home Management;DME Instruction;Gait training;Functional mobility training;Neuromuscular re-education;Balance training;Therapeutic exercise;Therapeutic activities;Patient/family education;Wheelchair mobility training;Passive range of motion;Orthotic Fit/Training;Energy conservation    PT Next Visit Plan  look at pics of w/c, did pt bring AFO? stand pivot/ stand step transfers from w/c > mat table with SPC. continue Initial supine/seated HEP for LE strength/balance. Standing balance at counter top for ADLs.    PT Home Exercise Plan  [email or web address]    Consulted and Agree with Plan of Care  Patient       Patient will benefit from skilled therapeutic intervention in order to improve the following deficits and impairments:  Abnormal gait, Decreased activity tolerance, Decreased balance, Decreased endurance, Decreased coordination, Decreased mobility, Decreased range of motion, Difficulty walking, Decreased strength, Impaired tone, Postural dysfunction  Visit Diagnosis: Abnormal posture  Other symptoms and signs involving the nervous system  Repeated falls  Unsteadiness on feet  Muscle weakness (generalized)  Difficulty in walking, not elsewhere classified  Hemiplegia and hemiparesis following cerebral infarction  affecting left non-dominant side [facility])     Problem List Patient Active Problem List   Diagnosis Date Noted  . Malignant neoplasm of prostate ([place])  08/19/2018    Arliss Journey, PT, DPT 03/01/2019, 10:10 AM  [facility] [address] [place], [place], [address] Phone: [phone]   Fax:  [phone]  Name: [name] MRN: [id number] Date of Birth: [birth year]

## 2019-03-01 NOTE — Therapy (Addendum)
[facility] [address] [place], [place], [address] Phone: [phone]   Fax:  [phone]  Occupational Therapy Treatment  Patient Details  Name: [name] MRN: [id number] Date of Birth: [birth year] Referring Provider (OT): Dr, Jeanie Cooks   Encounter Date: 03/01/2019  OT End of Session - 03/01/19 1022    Visit Number  2    Number of Visits  6 eval plus 5 visits   Date for OT Re-Evaluation  04/05/19    Authorization Type  Medicaid 3 visits approved through 03/21/2019.  WIll then re submit for remaining 2 visits    Authorization - Visit Number  2    Authorization - Number of Visits  4   eval plus 3 visits   OT Start Time  0801    OT Stop Time  0843    OT Time Calculation (min)  42 min    Activity Tolerance  Patient tolerated treatment well       Past Medical History:  Diagnosis Date  . Arthritis   . 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 COPD (chronic obstructive pulmonary disease) ([place])   . GERD (gastroesophageal reflux disease)   . 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 History of gout   . History of kidney stones   . Hyperlipidemia   . Hypertension   . Prostate cancer [facility]) urologist-- dr Darene Lamer. Tresa Moore;  oncologist-  dr [name]   dx 07-20-2017  . Renal calculi   . Stroke ([place]) sept 26,  20006    hx mini strokes in past, 1 large stroke  . Weakness of left side of body 03/2005   cva residual  . Weakness of one side of body    L sided weakness after stroke    Past Surgical History:  Procedure Laterality Date  . COLONOSCOPY WITH PROPOFOL N/A 09/27/2015   Procedure: COLONOSCOPY WITH PROPOFOL;  Surgeon: [name], MD;  [facility];  Service: Endoscopy;  Laterality: N/A;  . CYSTOSCOPY N/A 10/05/2012   Procedure: CYSTOSCOPY, retrograde and right stent placement;   Surgeon: [name], MD;  [facility];  Service: Urology;  Laterality: N/A;  . CYSTOSCOPY WITH RETROGRADE PYELOGRAM, URETEROSCOPY AND STENT PLACEMENT Right 04/13/2014   Procedure: CYSTOSCOPY WITH RETROGRADE PYELOGRAM, URETEROSCOPY AND STENT PLACEMENT, LASER ABLATION OF SCAR TISSUE;  Surgeon: [name], MD;  [facility];  Service: Urology;  Laterality: Right;  . EYE SURGERY Left age 33  . HOLMIUM LASER APPLICATION Right [id number]   Procedure: HOLMIUM LASER APPLICATION;  Surgeon: [name], MD;  [facility];  Service: Urology;  Laterality: Right;  . NEPHROLITHOTOMY Right 10/05/2012   Procedure: NEPHROLITHOTOMY PERCUTANEOUS;  Surgeon: [name], MD;  [facility];  Service: Urology;  Laterality: Right;  WITH CYSTO AND SURGEON ACCESS   . NEPHROLITHOTOMY Right 10/07/2012   Procedure: NEPHROLITHOTOMY PERCUTANEOUS SECOND LOOK. Stent exchange,Ureteroscopy ;  Surgeon: [name], MD;  [facility];  Service: Urology;  Laterality: Right;  . RADIOACTIVE SEED IMPLANT N/A 11/12/2018   Procedure: RADIOACTIVE SEED IMPLANT/BRACHYTHERAPY IMPLANT;  Surgeon: [name], MD;  [facility];  Service: Urology;  Laterality: N/A;  90 MINS  . SPACE OAR INSTILLATION N/A 11/12/2018   Procedure: SPACE OAR INSTILLATION;  Surgeon: [name], MD;  [facility];  Service: Urology;  Laterality: N/A;  .  TOENAIL EXCISION      There were no vitals filed for this visit.  Subjective Assessment - 03/01/19 0802    Subjective   My arm feels better since I worked with you    Pertinent History  R CVA in 03/26/2005.  [id number], HLD, HTN, blind in L eye, prostate cancer, OA and gout    Patient Stated Goals  I want to get stronger    Currently in Pain?  No/denies                   OT Treatments/Exercises (OP) - 03/01/19 1025      ADLs   ADL Comments  Reviewed OT goals and POC and pt in agreement.  Sent second message to primary MD to ask for referral for  physiatry      Neurological Re-education Exercises   Other Exercises 2  Neuro re ed to initiate development of HEP for LUE.  After instruction and practice pt able to return demonstrate all activities without pain. See pt instruction section for details.              OT Education - 03/01/19 1019    Education Details  HEP for LUE    Person(s) Educated  Patient    Methods  Explanation;Demonstration;Handout    Comprehension  Verbalized understanding;Returned demonstration       OT Short Term Goals - 03/01/19 1020      OT SHORT TERM GOAL #1   Title  Pt will be mod I for basic HEP for LUE - 03/29/2019 (date adjusted to allow for Medicaid approval)    Baseline  dependent    Status  On-going      OT SHORT TERM GOAL #2   Title  Pt will be mod I for splint wear and care    Baseline  dependent    Status  On-going        OT Long Term Goals - 03/01/19 1020      OT LONG TERM GOAL #1   Title  Pt will be mod I with upgraded HEP for LUE - 04/05/2019 (date adjusted to allow for Medicaid approval)    Baseline  dependent    Status  On-going      OT LONG TERM GOAL #2   Title  Pt will demonstate understanding of spasticity mgmt techniques for LUE    Baseline  dependent    Status  On-going      OT LONG TERM GOAL #4   Title  Pt will demonstrate ability for shoulder flexion to 100* with 0/10 pain for HEP    Baseline  90* of shoulder flexion    Status  On-going            Plan - 03/01/19 1021    Clinical Impression Statement  Pt in agreement with OT goals and POC. Pt progressing toward goals.    OT Occupational Profile and History  Detailed Assessment- Review of Records and additional review of physical, cognitive, psychosocial history related to current functional performance    Occupational performance deficits (Please refer to evaluation for details):  ADL's;IADL's    Body Structure / Function / Physical Skills  ADL;Edema;Sensation;ROM    Rehab Potential  Good    Clinical  Decision Making  Limited treatment options, no task modification necessary    Comorbidities Affecting Occupational Performance:  May have comorbidities impacting occupational performance    Modification or Assistance to Complete Evaluation   Min-Moderate modification of tasks or  assist with assess necessary to complete eval    OT Frequency  1x / week    OT Duration  Other (comment)   5 weeks   OT Treatment/Interventions  Self-care/ADL training;Therapeutic exercise;Neuromuscular education;Passive range of motion;Splinting;Therapeutic activities;Patient/family education    Plan  check HEP, splint for LUE, address spasticity/pain free ROM of L shoulder, alignment of L shoulder girdle    Consulted and Agree with Plan of Care  Patient       Patient will benefit from skilled therapeutic intervention in order to improve the following deficits and impairments:   Body Structure / Function / Physical Skills: ADL, Edema, Sensation, ROM       Visit Diagnosis: Abnormal posture  Other symptoms and signs involving the nervous system  Other symptoms and signs involving the musculoskeletal system  Stiffness of left shoulder, not elsewhere classified  Stiffness of left hand, not elsewhere classified    Problem List Patient Active Problem List   Diagnosis Date Noted  . Malignant neoplasm of prostate ([place]) 08/19/2018    Quay Burow, OTR/L 03/01/2019, 10:49 AM  [facility] [address] [place], [place], [address] Phone: [phone]   Fax:  [phone]  Name: [name] MRN: [id number] Date of Birth: May 10, 1961

## 2019-03-01 NOTE — Patient Instructions (Signed)
Do these exercises for your arm at home. Do these every day. MOVE SLOWLY - quick movements will make your arm tighter. ONLY work in pain free ranges!!  Sit at table and place your arms on a towel.  1. Grasp your left arm at the wrist with your right arm. SLOWLY slide your arms out in front of you as far as you can as long as you don't have any pain. HOLD FOR A SLOW COUNT OF 5 then slide your hands back toward your belly. Do 10, rest then do 10 more.  2. Grasp your left arm at the wrist with your right hand. SLOWLY slide your arms out in front of you as far as you can go without pain. KEEPING YOUR ARMS STRAIGHT, slowly move from the middle to the left and back to the middle again like a windshield wiper. Do 10, rest then do 10 more.

## 2019-03-01 NOTE — Patient Instructions (Addendum)
Bring in a picture of you in your current w/c: 1) sitting from the front 2) side view   Bring in your AFO (ankle brace) to next visit   Access Code: [facility]  URL: [email or web address]  Date: 03/01/2019  Prepared by: Janann August   Exercises Supine Bridge - 5 reps - 3 sets - 1x daily - 5x weekly Seated Hip Adduction Isometrics with Ball - 5 reps - 3 sets - 1x daily - 5x weekly

## 2019-03-03 ENCOUNTER — Other Ambulatory Visit: Payer: Self-pay

## 2019-03-03 ENCOUNTER — Ambulatory Visit: Payer: Medicaid Other | Attending: Internal Medicine | Admitting: Physical Therapy

## 2019-03-03 ENCOUNTER — Encounter: Payer: Self-pay | Admitting: Physical Therapy

## 2019-03-03 DIAGNOSIS — M25642 Stiffness of left hand, not elsewhere classified: Secondary | ICD-10-CM | POA: Insufficient documentation

## 2019-03-03 DIAGNOSIS — R262 Difficulty in walking, not elsewhere classified: Secondary | ICD-10-CM | POA: Diagnosis present

## 2019-03-03 DIAGNOSIS — R296 Repeated falls: Secondary | ICD-10-CM | POA: Diagnosis present

## 2019-03-03 DIAGNOSIS — R293 Abnormal posture: Secondary | ICD-10-CM | POA: Diagnosis present

## 2019-03-03 DIAGNOSIS — R29898 Other symptoms and signs involving the musculoskeletal system: Secondary | ICD-10-CM | POA: Diagnosis present

## 2019-03-03 DIAGNOSIS — R29818 Other symptoms and signs involving the nervous system: Secondary | ICD-10-CM | POA: Diagnosis present

## 2019-03-03 DIAGNOSIS — R2689 Other abnormalities of gait and mobility: Secondary | ICD-10-CM | POA: Insufficient documentation

## 2019-03-03 DIAGNOSIS — R2681 Unsteadiness on feet: Secondary | ICD-10-CM | POA: Diagnosis present

## 2019-03-03 DIAGNOSIS — I69354 Hemiplegia and hemiparesis following cerebral infarction affecting left non-dominant side: Secondary | ICD-10-CM | POA: Diagnosis present

## 2019-03-03 DIAGNOSIS — M25612 Stiffness of left shoulder, not elsewhere classified: Secondary | ICD-10-CM | POA: Insufficient documentation

## 2019-03-03 DIAGNOSIS — M6281 Muscle weakness (generalized): Secondary | ICD-10-CM | POA: Diagnosis present

## 2019-03-03 NOTE — Patient Instructions (Signed)
Access Code: [email or web address]  URL: [email or web address]  Date: 03/03/2019  Prepared by: Janann August   Exercises Supine Bridge - 5 reps - 3 sets - 1x daily - 5x weekly Seated Hip Adduction Isometrics with Ball - 5 reps - 3 sets - 2x daily - 5x weekly Bent Knee Fallouts - 5 reps - 3 sets - 2x daily - 7x weekly Supine Quad Set - 10 reps - 2 sets - 2x daily - 7x weekly

## 2019-03-03 NOTE — Therapy (Signed)
[facility] [address] [place], [place], [address] Phone: [phone]   Fax:  [phone]  Physical Therapy Treatment  Patient Details  Name: [name] MRN: [id number] Date of Birth: 11-Jan-1961 Referring Provider (PT): Ezekiel Ina, MD   Encounter Date: 03/03/2019  PT End of Session - 03/03/19 0856    Visit Number  3    Number of Visits  13   eval plus 12 visits   Date for PT Re-Evaluation  04/05/19    Authorization Type  Medicaid - awaiting approval of 12 visits    Authorization - Visit Number  2    Authorization - Number of Visits  12    PT Start Time  0800    PT Stop Time  0845    PT Time Calculation (min)  45 min    Activity Tolerance  Patient tolerated treatment well    Behavior During Therapy  [facility] for tasks assessed/performed       Past Medical History:  Diagnosis Date  . Arthritis   . 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 COPD (chronic obstructive pulmonary disease) ([place])   . GERD (gastroesophageal reflux disease)   . 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 History of gout   . History of kidney stones   . Hyperlipidemia   . Hypertension   . Prostate cancer [facility]) urologist-- dr Darene Lamer. Tresa Moore;  oncologist-  dr [name]   dx 07-20-2017  . Renal calculi   . Stroke ([place]) sept 26,  20006    hx mini strokes in past, 1 large stroke  . Weakness of left side of body 03/2005   cva residual  . Weakness of one side of body    L sided weakness after stroke    Past Surgical History:  Procedure Laterality Date  . COLONOSCOPY WITH PROPOFOL N/A 09/27/2015   Procedure: COLONOSCOPY WITH PROPOFOL;  Surgeon: [name], MD;  [facility];  Service: Endoscopy;  Laterality: N/A;  . CYSTOSCOPY N/A 10/05/2012   Procedure: CYSTOSCOPY, retrograde and right stent placement;   Surgeon: [name], MD;  [facility];  Service: Urology;  Laterality: N/A;  . CYSTOSCOPY WITH RETROGRADE PYELOGRAM, URETEROSCOPY AND STENT PLACEMENT Right 04/13/2014   Procedure: CYSTOSCOPY WITH RETROGRADE PYELOGRAM, URETEROSCOPY AND STENT PLACEMENT, LASER ABLATION OF SCAR TISSUE;  Surgeon: [name], MD;  [facility];  Service: Urology;  Laterality: Right;  . EYE SURGERY Left age 31  . HOLMIUM LASER APPLICATION Right [id number]   Procedure: HOLMIUM LASER APPLICATION;  Surgeon: [name], MD;  [facility];  Service: Urology;  Laterality: Right;  . NEPHROLITHOTOMY Right 10/05/2012   Procedure: NEPHROLITHOTOMY PERCUTANEOUS;  Surgeon: [name], MD;  [facility];  Service: Urology;  Laterality: Right;  WITH CYSTO AND SURGEON ACCESS   . NEPHROLITHOTOMY Right 10/07/2012   Procedure: NEPHROLITHOTOMY PERCUTANEOUS SECOND LOOK. Stent exchange,Ureteroscopy ;  Surgeon: [name], MD;  [facility];  Service: Urology;  Laterality: Right;  . RADIOACTIVE SEED IMPLANT N/A 11/12/2018   Procedure: RADIOACTIVE SEED IMPLANT/BRACHYTHERAPY IMPLANT;  Surgeon: [name], MD;  [facility];  Service: Urology;  Laterality: N/A;  90 MINS  . SPACE OAR INSTILLATION N/A 11/12/2018   Procedure: SPACE OAR INSTILLATION;  Surgeon: [name], MD;  [facility];  Service: Urology;  Laterality: N/A;  .  TOENAIL EXCISION      There were no vitals filed for this visit.  Subjective Assessment - 03/03/19 0804    Subjective  No almost falls or falls. Arrives today wearing his L AFO. Brought in pictures of his wheelchair today from [facility]. Not pleased with his w/c, sits too low to the ground, is a lot harder to stand up from. His current w/c does not have wheels up towards the front, makes it harder to go up the ramp in his backyard. Company ordered the wheel chair based on his weight. Previous wheelchair from [employer] - they came out and measured him.     Pertinent History  R CVA (03/26/2005); blind in L eye and low vision in R eye; HLD, HTN, COPD, prostate cancer    Limitations  Walking;Standing    How long can you walk comfortably?  to his mailbox (about 50 ft)    Patient Stated Goals  wants to get his L leg working again                       [facility] Adult PT Treatment/Exercise - 03/03/19 0001      Transfers   Transfers  Stand to Sit;Sit to Stand    Sit to Stand  4: Min guard;4: Min assist    Sit to Stand Details (indicate cue type and reason)  Sit to stand from low blue mat table with RUE assist to mimic getting up and down from lower w/c surface - cueing for set up and positioning, min A facilitation from therapist to shift weight left before standing and before lowering. Cues for eccentric technique and increased WB on LLE - 1 x 5 reps. once in standing performing static standing balance x1 minute with no UE support with therapist faclilitating weight shifting towards L>R from hips, with increased holds on L      Ambulation/Gait   Ambulation/Gait  Yes    Ambulation/Gait Assistance  4: Min guard    Ambulation Distance (Feet)  100 Feet   approx. into clinic    Assistive device  Straight cane   L AFO   Gait Pattern  Decreased stance time - left;Decreased step length - left;Decreased arm swing - left;Decreased stride length;Decreased weight shift to left;Decreased dorsiflexion - left;Decreased hip/knee flexion - left;Trunk flexed;Poor foot clearance - left;Wide base of support;Left circumduction;Step-through pattern    Ambulation Surface  Level;Indoor    Gait Comments  Pt with improved gait mechanics today with use of L AFO - demonstrated step through pattern and increased L foot clearance. Educated pt to continue wearing brace for safety with ambulation at home and into and out of PT for increased safety with gait with SPC and to decrease risk of future falls.       Wheelchair Mobility   Comments  Pt brought in picture of  himself (from therapist's recommendation from last session) seated in his new w/c. Pt with improper positioning due to too low of a seat, improper seat depth, and foot plate placement. Pt demonstrated increased hip ABD, leaning to the R, posterior pelvic tilt, and inability to place feet flat on footplate. Discussed the importance of positioning with pt - asked if maybe one day we can arrange for transportation to bring pt here (so he can arrive in his w/c). Therapist also stated they would look into more information about the w/c company to see if they can get w/c adjusted to pt's measurements  Access Code: [email or web address]  URL: [email or web address]  Date: 03/03/2019  Prepared by: Janann August   Exercises Supine Bridge - 5 reps - 3 sets - 1x daily - 5x weekly --> reviewed technique  Seated Hip Adduction Isometrics with Ball - 5 reps - 3 sets - 2x daily - 5x weekly Bent Knee Fallouts - 5 reps - 3 sets - 2x daily - 7x weekly --> added to HEP  Supine Quad Set - 10 reps - 2 sets - 2x daily - 7x weekly --> added to HEP on LLE, verbal and manual cueing for technique      PT Education - 03/03/19 0855    Education Details  strength additions to HEP, therapist will get more information about wc/ wheelchair company to see what can be done about any wc adjustments, increased use of L AFO for safety during gait at home and in and out of therapy appointments    Person(s) Educated  Patient    Methods  Explanation;Handout;Demonstration    Comprehension  Verbalized understanding;Returned demonstration       PT Short Term Goals - 02/15/19 1300      PT SHORT TERM GOAL #1   Title  Patient will be independent with initial HEP for balance and LE strenghtening in order to build upon functional gains in therapy. ALL STGS DUE BY 6TH VISIT.    Baseline  currently dependent    Time  6    Period  --   6th visit   Status  New    Target Date  --      PT SHORT TERM GOAL #2   Title   Patient will decrease TUG score using single point cane to at least 53 seconds in order to decrease risk of falls.    Baseline  59.16 seconds on 02/15/19    Time  --    Period  --    Status  New      PT SHORT TERM GOAL #3   Title  Patient will ambulate 20' with supervision with single point cane in order to improve mobility for household distances.    Baseline  min guard/ min A with single point cane on 02/15/19    Time  --    Period  --    Status  New      PT SHORT TERM GOAL #4   Title  Patient will perform w/c mobility with power w/c outside over paved/cement surfaces for 250' with mod I in order to improve community mobility and safety.    Baseline  not yet assessed; pt states he will be receiving new power w/c this week    Time  --    Period  --    Status  New      PT SHORT TERM GOAL #5   Title  Patient will tolerate static standing balance x3 minutes at countertop with supervision in order to increase endurance for ADLs.    Baseline  not yet assessed.    Time  --    Period  --    Status  New        PT Long Term Goals - 02/15/19 1304      PT LONG TERM GOAL #1   Title  Patient will be independent with initial HEP for balance and LE strengthening in order to build upon functional gains in therapy. ALL LTGs DUE 04/05/19    Baseline  dependent with HEP    Time  6  Period  Weeks    Status  New    Target Date  04/05/19      PT LONG TERM GOAL #2   Title  Patient will ambulate 58' with supervision with single point cane in order to improve mobility for household distances.    Baseline  min guard/ min A with single point cane on 02/15/19    Time  --    Period  --    Status  New      PT LONG TERM GOAL #3   Title  Patient will tolerate static standing balance x5 minutes at countertop with supervision in order to increase endurance for ADLs.    Baseline  not yet assessed    Time  --    Period  --    Status  New      PT LONG TERM GOAL #4   Title  Patient will decrease TUG  score using single point cane to at least 49 seconds in order to decrease risk of falls.    Baseline  59.16 seconds on 02/15/19    Time  --    Period  --    Status  New      PT LONG TERM GOAL #5   Title  Patient will perform at least 4 sit <> stands from arm chair with UE support during 30 second chair in order to demonstrate improved LE strength.    Baseline  2 sit <> stands during 30 seconds    Time  --    Period  --    Status  New      Additional Long Term Goals   Additional Long Term Goals  Yes      PT LONG TERM GOAL #6   Title  Patient will perform w/c mobility with power w/c outside over paved/cement surfaces and around obstacles for 500' with mod I in order to improve community mobility and safety.    Baseline  not yet assessed    Time  --    Period  --    Status  New      PT LONG TERM GOAL #7   Title  Patient will perform stand pivot t/f from power w/c <> mat table using single point cane and mod I in order to improve safety at home.    Baseline  not yet assessed    Time  --    Period  --    Status  New            Plan - 03/03/19 0903    Clinical Impression Statement  Focus of today's session was reviewing HEP - supine and seated for improved LE strength. Pt needing cueing for proper technique. Advised pt to perform at home when aide is present in order to assist with correct technique, especially for bridging, or use belt loop around LLE to help assist LE get into position. Focused on sit <> stands today from a lower mat table and weight shifting laterally L>R and keeping weight towards L when eccentrically sitting down. Will continue to progress towards LTGs.    Personal Factors and Comorbidities  Comorbidity 3+;Past/Current Experience;Time since onset of injury/illness/exacerbation    Comorbidities  HTN, HLD, prostate cancer, COPD, blind in L eye. OA and gout.    Examination-Activity Limitations  Locomotion Level;Stand;Transfers    Stability/Clinical Decision  Making  Evolving/Moderate complexity    Rehab Potential  Fair    PT Frequency  2x / week  PT Duration  6 weeks    PT Treatment/Interventions  ADLs/Self Care Home Management;DME Instruction;Gait training;Functional mobility training;Neuromuscular re-education;Balance training;Therapeutic exercise;Therapeutic activities;Patient/family education;Wheelchair mobility training;Passive range of motion;Orthotic Fit/Training;Energy conservation    PT Next Visit Plan  any additional info about w/c adjustments? Gait training with SPC and L AFO for safety for household mobility. Standing balance at counter top for ADLs. Standing balance with weight shifting A/P and M/L (esp towards LLE). Sit to stands. continue/review supine/seated HEP for LE strength/balance.    PT Home Exercise Plan  [id number]    Consulted and Agree with Plan of Care  Patient       Patient will benefit from skilled therapeutic intervention in order to improve the following deficits and impairments:  Abnormal gait, Decreased activity tolerance, Decreased balance, Decreased endurance, Decreased coordination, Decreased mobility, Decreased range of motion, Difficulty walking, Decreased strength, Impaired tone, Postural dysfunction  Visit Diagnosis: Abnormal posture  Other symptoms and signs involving the nervous system  Repeated falls  Unsteadiness on feet  Muscle weakness (generalized)  Difficulty in walking, not elsewhere classified  Hemiplegia and hemiparesis following cerebral infarction affecting left non-dominant side [facility])     Problem List Patient Active Problem List   Diagnosis Date Noted  . Malignant neoplasm of prostate ([place]) 08/19/2018    Arliss Journey, PT, DPT 03/03/2019, 9:07 AM  [facility] [address] [place], [place], [address] Phone: [phone]   Fax:  [phone]  Name: [name] MRN: [id number] Date of Birth: 1960-08-19

## 2019-03-09 ENCOUNTER — Ambulatory Visit: Payer: Medicaid Other | Admitting: Physical Therapy

## 2019-03-09 ENCOUNTER — Ambulatory Visit: Payer: Medicaid Other | Admitting: Occupational Therapy

## 2019-03-09 ENCOUNTER — Encounter: Payer: Self-pay | Admitting: Physical Therapy

## 2019-03-09 ENCOUNTER — Other Ambulatory Visit: Payer: Self-pay

## 2019-03-09 DIAGNOSIS — R296 Repeated falls: Secondary | ICD-10-CM

## 2019-03-09 DIAGNOSIS — I69354 Hemiplegia and hemiparesis following cerebral infarction affecting left non-dominant side: Secondary | ICD-10-CM

## 2019-03-09 DIAGNOSIS — M6281 Muscle weakness (generalized): Secondary | ICD-10-CM

## 2019-03-09 DIAGNOSIS — R2689 Other abnormalities of gait and mobility: Secondary | ICD-10-CM

## 2019-03-09 DIAGNOSIS — R262 Difficulty in walking, not elsewhere classified: Secondary | ICD-10-CM

## 2019-03-09 DIAGNOSIS — R293 Abnormal posture: Secondary | ICD-10-CM | POA: Diagnosis not present

## 2019-03-09 DIAGNOSIS — R2681 Unsteadiness on feet: Secondary | ICD-10-CM

## 2019-03-09 DIAGNOSIS — R29898 Other symptoms and signs involving the musculoskeletal system: Secondary | ICD-10-CM

## 2019-03-09 DIAGNOSIS — M25642 Stiffness of left hand, not elsewhere classified: Secondary | ICD-10-CM

## 2019-03-09 NOTE — Therapy (Signed)
[facility] [address] [place], [place], [address] Phone: [phone]   Fax:  [phone]  Occupational Therapy Treatment  Patient Details  Name: [name] MRN: [id number] Date of Birth: [birth year] Referring Provider (OT): Dr, Jeanie Cooks   Encounter Date: 03/09/2019  OT End of Session - 03/09/19 1225    Visit Number  3    Number of Visits  6   eval plus 5 visits   Date for OT Re-Evaluation  04/05/19    Authorization Type  Medicaid 3 visits approved through 03/21/2019.  WIll then re submit for remaining 2 visits    Authorization - Visit Number  3    Authorization - Number of Visits  4   eval plus 3 visits   OT Start Time  0803    OT Stop Time  [id number]    OT Time Calculation (min)  41 min    Activity Tolerance  Patient tolerated treatment well       Past Medical History:  Diagnosis Date  . Arthritis   . 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 COPD (chronic obstructive pulmonary disease) ([place])   . GERD (gastroesophageal reflux disease)   . 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 History of gout   . History of kidney stones   . Hyperlipidemia   . Hypertension   . Prostate cancer [facility]) urologist-- dr Darene Lamer. Tresa Moore;  oncologist-  dr [name]   dx 07-20-2017  . Renal calculi   . Stroke ([place]) sept 26,  20006    hx mini strokes in past, 1 large stroke  . Weakness of left side of body 03/2005   cva residual  . Weakness of one side of body    L sided weakness after stroke    Past Surgical History:  Procedure Laterality Date  . COLONOSCOPY WITH PROPOFOL N/A 09/27/2015   Procedure: COLONOSCOPY WITH PROPOFOL;  Surgeon: [name], MD;  [facility];  Service: Endoscopy;  Laterality: N/A;  . CYSTOSCOPY N/A 10/05/2012   Procedure: CYSTOSCOPY, retrograde and right stent placement;   Surgeon: [name], MD;  [facility];  Service: Urology;  Laterality: N/A;  . CYSTOSCOPY WITH RETROGRADE PYELOGRAM, URETEROSCOPY AND STENT PLACEMENT Right 04/13/2014   Procedure: CYSTOSCOPY WITH RETROGRADE PYELOGRAM, URETEROSCOPY AND STENT PLACEMENT, LASER ABLATION OF SCAR TISSUE;  Surgeon: [name], MD;  [facility];  Service: Urology;  Laterality: Right;  . EYE SURGERY Left age 66  . HOLMIUM LASER APPLICATION Right [id number]   Procedure: HOLMIUM LASER APPLICATION;  Surgeon: [name], MD;  [facility];  Service: Urology;  Laterality: Right;  . NEPHROLITHOTOMY Right 10/05/2012   Procedure: NEPHROLITHOTOMY PERCUTANEOUS;  Surgeon: [name], MD;  [facility];  Service: Urology;  Laterality: Right;  WITH CYSTO AND SURGEON ACCESS   . NEPHROLITHOTOMY Right 10/07/2012   Procedure: NEPHROLITHOTOMY PERCUTANEOUS SECOND LOOK. Stent exchange,Ureteroscopy ;  Surgeon: [name], MD;  [facility];  Service: Urology;  Laterality: Right;  . RADIOACTIVE SEED IMPLANT N/A 11/12/2018   Procedure: RADIOACTIVE SEED IMPLANT/BRACHYTHERAPY IMPLANT;  Surgeon: [name], MD;  [facility];  Service: Urology;  Laterality: N/A;  90 MINS  . SPACE OAR INSTILLATION N/A 11/12/2018   Procedure: SPACE OAR INSTILLATION;  Surgeon: [name], MD;  [facility];  Service: Urology;  Laterality: N/A;  .  TOENAIL EXCISION      There were no vitals filed for this visit.  Subjective Assessment - 03/09/19 1225    Subjective   Denies pain    Pertinent History  R CVA in 03/26/2005.  [id number], HLD, HTN, blind in L eye, prostate cancer, OA and gout    Patient Stated Goals  I want to get stronger    Currently in Pain?  No/denies              Treatment: Therapist fitted pt with a custom resting hand splint for improved positioning. Splint was not issued yet due to time constraints and need for additional adjustments at 5th digit. Pt needs practice with  application.               OT Short Term Goals - 03/01/19 1020      OT SHORT TERM GOAL #1   Title  Pt will be mod I for basic HEP for LUE - 03/29/2019 (date adjusted to allow for Medicaid approval)    Baseline  dependent    Status  On-going      OT SHORT TERM GOAL #2   Title  Pt will be mod I for splint wear and care    Baseline  dependent    Status  On-going        OT Long Term Goals - 03/01/19 1020      OT LONG TERM GOAL #1   Title  Pt will be mod I with upgraded HEP for LUE - 04/05/2019 (date adjusted to allow for Medicaid approval)    Baseline  dependent    Status  On-going      OT LONG TERM GOAL #2   Title  Pt will demonstate understanding of spasticity mgmt techniques for LUE    Baseline  dependent    Status  On-going      OT LONG TERM GOAL #4   Title  Pt will demonstrate ability for shoulder flexion to 100* with 0/10 pain for HEP    Baseline  90* of shoulder flexion    Status  On-going            Plan - 03/09/19 1227    Clinical Impression Statement  Pt is progressing towards goals. Therapist initiated fabrication and fitting of resting hand splint. Therapist did not issue due to time constraints and additional modifications needed.    OT Occupational Profile and History  Detailed Assessment- Review of Records and additional review of physical, cognitive, psychosocial history related to current functional performance    Occupational performance deficits (Please refer to evaluation for details):  ADL's;IADL's    Body Structure / Function / Physical Skills  ADL;Edema;Sensation;ROM    Rehab Potential  Good    Clinical Decision Making  Limited treatment options, no task modification necessary    Comorbidities Affecting Occupational Performance:  May have comorbidities impacting occupational performance    Modification or Assistance to Complete Evaluation   Min-Moderate modification of tasks or assist with assess necessary to complete eval    OT  Frequency  1x / week    OT Duration  Other (comment)   5 weeks   OT Treatment/Interventions  Self-care/ADL training;Therapeutic exercise;Neuromuscular education;Passive range of motion;Splinting;Therapeutic activities;Patient/family education    Plan  complete splint fitting, educate pt in application and wear schedule    Consulted and Agree with Plan of Care  Patient       Patient will benefit from skilled therapeutic intervention in order to improve  the following deficits and impairments:   Body Structure / Function / Physical Skills: ADL, Edema, Sensation, ROM       Visit Diagnosis: No diagnosis found.    Problem List Patient Active Problem List   Diagnosis Date Noted  . Malignant neoplasm of prostate ([place]) 08/19/2018    Emelie Newsom 03/09/2019, 12:29 PM  [facility] [address] [place], [place], [address] Phone: [phone]   Fax:  [phone]  Name: [name] MRN: [id number] Date of Birth: 1960-07-17

## 2019-03-09 NOTE — Therapy (Signed)
[facility] [address] [place], [place], [address] Phone: [phone]   Fax:  [phone]  Physical Therapy Treatment  Patient Details  Name: [name] MRN: [id number] Date of Birth: May 17, 1961 Referring Provider (PT): Ezekiel Ina, MD   Encounter Date: 03/09/2019  PT End of Session - 03/09/19 0721    Visit Number  4    Number of Visits  13   eval plus 12 visits   Date for PT Re-Evaluation  04/05/19    Authorization Type  12 visits from 8/31 through 04/11/19    Authorization - Visit Number  3    Authorization - Number of Visits  12    PT Start Time  0716    PT Stop Time  0756    PT Time Calculation (min)  40 min    Activity Tolerance  Patient tolerated treatment well    Behavior During Therapy  [facility] for tasks assessed/performed       Past Medical History:  Diagnosis Date  . Arthritis   . 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 COPD (chronic obstructive pulmonary disease) ([place])   . GERD (gastroesophageal reflux disease)   . 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 History of gout   . History of kidney stones   . Hyperlipidemia   . Hypertension   . Prostate cancer [facility]) urologist-- dr Darene Lamer. Tresa Moore;  oncologist-  dr [name]   dx 07-20-2017  . Renal calculi   . Stroke ([place]) sept 26,  20006    hx mini strokes in past, 1 large stroke  . Weakness of left side of body 03/2005   cva residual  . Weakness of one side of body    L sided weakness after stroke    Past Surgical History:  Procedure Laterality Date  . COLONOSCOPY WITH PROPOFOL N/A 09/27/2015   Procedure: COLONOSCOPY WITH PROPOFOL;  Surgeon: [name], MD;  [facility];  Service: Endoscopy;  Laterality: N/A;  . CYSTOSCOPY N/A 10/05/2012   Procedure: CYSTOSCOPY, retrograde and right stent placement;   Surgeon: [name], MD;  [facility];  Service: Urology;  Laterality: N/A;  . CYSTOSCOPY WITH RETROGRADE PYELOGRAM, URETEROSCOPY AND STENT PLACEMENT Right 04/13/2014   Procedure: CYSTOSCOPY WITH RETROGRADE PYELOGRAM, URETEROSCOPY AND STENT PLACEMENT, LASER ABLATION OF SCAR TISSUE;  Surgeon: [name], MD;  [facility];  Service: Urology;  Laterality: Right;  . EYE SURGERY Left age 35  . HOLMIUM LASER APPLICATION Right [id number]   Procedure: HOLMIUM LASER APPLICATION;  Surgeon: [name], MD;  [facility];  Service: Urology;  Laterality: Right;  . NEPHROLITHOTOMY Right 10/05/2012   Procedure: NEPHROLITHOTOMY PERCUTANEOUS;  Surgeon: [name], MD;  [facility];  Service: Urology;  Laterality: Right;  WITH CYSTO AND SURGEON ACCESS   . NEPHROLITHOTOMY Right 10/07/2012   Procedure: NEPHROLITHOTOMY PERCUTANEOUS SECOND LOOK. Stent exchange,Ureteroscopy ;  Surgeon: [name], MD;  [facility];  Service: Urology;  Laterality: Right;  . RADIOACTIVE SEED IMPLANT N/A 11/12/2018   Procedure: RADIOACTIVE SEED IMPLANT/BRACHYTHERAPY IMPLANT;  Surgeon: [name], MD;  [facility];  Service: Urology;  Laterality: N/A;  90 MINS  . SPACE OAR INSTILLATION N/A 11/12/2018   Procedure: SPACE OAR INSTILLATION;  Surgeon: [name], MD;  [facility];  Service: Urology;  Laterality: N/A;  .  TOENAIL EXCISION      There were no vitals filed for this visit.  Subjective Assessment - 03/09/19 0720    Subjective  No new complaints.  No falls or pain to report. Did not wear brace to therapy today, reports he was "too tired" to put it on.    Pertinent History  R CVA (03/26/2005); blind in L eye and low vision in R eye; HLD, HTN, COPD, prostate cancer    How long can you walk comfortably?  to his mailbox (about 50 ft)    Patient Stated Goals  wants to get his L leg working again    Currently in Pain?  No/denies           [facility] Adult PT Treatment/Exercise -  03/09/19 0725      Transfers   Transfers  Stand to Sit;Sit to Stand    Sit to Stand  4: Min guard;With upper extremity assist;From bed;From chair/3-in-1    Stand to Sit  4: Min guard;With upper extremity assist;To bed;To chair/3-in-1    Number of Reps  10 reps;1 set    Comments  cues on weight shifting and controlled descent with sititng down.       Ambulation/Gait   Ambulation/Gait  Yes    Ambulation/Gait Assistance  4: Min guard    Ambulation/Gait Assistance Details  pt did not wear brace today. gait training with cane only with cues for increased right step length and left LE stance time.     Ambulation Distance (Feet)  80 Feet   x4    Assistive device  Straight cane    Gait Pattern  Decreased stance time - left;Decreased step length - left;Decreased arm swing - left;Decreased stride length;Decreased weight shift to left;Decreased dorsiflexion - left;Decreased hip/knee flexion - left;Trunk flexed;Poor foot clearance - left;Wide base of support;Left circumduction;Step-through pattern    Ambulation Surface  Level;Indoor      Knee/Hip Exercises: Aerobic   Other Aerobic  Scifit with LE's/right UE level 2.5 for 8 minutues with goal >/=40 rpm for strengthening and activity tolerance.       Knee/Hip Exercises: Standing   Heel Raises  Both;1 set;10 reps;Limitations    Heel Raises Limitations  right UE support for balance    Hip Abduction  AROM;Stengthening;Both;1 set;10 reps;Knee straight;Limitations    Abduction Limitations  right UE support for balance    Functional Squat  1 set;10 reps;Limitations    Functional Squat Limitations  mini squats with right UE support for balance.           Balance Exercises - 03/09/19 0743      Balance Exercises: Standing   Standing Eyes Closed  Narrow base of support (BOS);Wide (BOA);Head turns;Foam/compliant surface;Solid surface;Other reps (comment);30 secs;Limitations    SLS with Vectors  Solid surface;Other reps (comment);Limitations       Balance Exercises: Standing   Standing Eyes Closed Limitations  on floor wtih feet together: EC no head movements, progressing to EC head movements left<>right, up<>down with cues on posture and weight shifting for balance, up to min assist; progressed to on 1 inch foam with feet apart for EC no head movements, progressing to EC head movements left<>right, up<>down,  no UE support with min guard to min assist for balance    SLS with Vectors Limitations  2 foam bubbles on floor: alternating fwd foot taps, then alternating cross foot taps, no UE support with up to min assist for balance needed. cues for stance/base of support and weight shifting  needed as well.           PT Short Term Goals - 02/15/19 1300      PT SHORT TERM GOAL #1   Title  Patient will be independent with initial HEP for balance and LE strenghtening in order to build upon functional gains in therapy. ALL STGS DUE BY 6TH VISIT.    Baseline  currently dependent    Time  6    Period  --   6th visit   Status  New    Target Date  --      PT SHORT TERM GOAL #2   Title  Patient will decrease TUG score using single point cane to at least 53 seconds in order to decrease risk of falls.    Baseline  59.16 seconds on 02/15/19    Time  --    Period  --    Status  New      PT SHORT TERM GOAL #3   Title  Patient will ambulate 76' with supervision with single point cane in order to improve mobility for household distances.    Baseline  min guard/ min A with single point cane on 02/15/19    Time  --    Period  --    Status  New      PT SHORT TERM GOAL #4   Title  Patient will perform w/c mobility with power w/c outside over paved/cement surfaces for 250' with mod I in order to improve community mobility and safety.    Baseline  not yet assessed; pt states he will be receiving new power w/c this week    Time  --    Period  --    Status  New      PT SHORT TERM GOAL #5   Title  Patient will tolerate static standing balance x3  minutes at countertop with supervision in order to increase endurance for ADLs.    Baseline  not yet assessed.    Time  --    Period  --    Status  New        PT Long Term Goals - 02/15/19 1304      PT LONG TERM GOAL #1   Title  Patient will be independent with initial HEP for balance and LE strengthening in order to build upon functional gains in therapy. ALL LTGs DUE 04/05/19    Baseline  dependent with HEP    Time  6    Period  Weeks    Status  New    Target Date  04/05/19      PT LONG TERM GOAL #2   Title  Patient will ambulate 51' with supervision with single point cane in order to improve mobility for household distances.    Baseline  min guard/ min A with single point cane on 02/15/19    Time  --    Period  --    Status  New      PT LONG TERM GOAL #3   Title  Patient will tolerate static standing balance x5 minutes at countertop with supervision in order to increase endurance for ADLs.    Baseline  not yet assessed    Time  --    Period  --    Status  New      PT LONG TERM GOAL #4   Title  Patient will decrease TUG score using single point cane to at least 49 seconds in order to  decrease risk of falls.    Baseline  59.16 seconds on 02/15/19    Time  --    Period  --    Status  New      PT LONG TERM GOAL #5   Title  Patient will perform at least 4 sit <> stands from arm chair with UE support during 30 second chair in order to demonstrate improved LE strength.    Baseline  2 sit <> stands during 30 seconds    Time  --    Period  --    Status  New      Additional Long Term Goals   Additional Long Term Goals  Yes      PT LONG TERM GOAL #6   Title  Patient will perform w/c mobility with power w/c outside over paved/cement surfaces and around obstacles for 500' with mod I in order to improve community mobility and safety.    Baseline  not yet assessed    Time  --    Period  --    Status  New      PT LONG TERM GOAL #7   Title  Patient will perform stand pivot  t/f from power w/c <> mat table using single point cane and mod I in order to improve safety at home.    Baseline  not yet assessed    Time  --    Period  --    Status  New           Plan - 03/09/19 0721    Clinical Impression Statement  Today's skilled session focused on strengthening, gait and balance with rest breaks needed due to fatigue. The pt is progressing toward goals and should benefit from continued PT to progress toward unmet goals.    Personal Factors and Comorbidities  Comorbidity 3+;Past/Current Experience;Time since onset of injury/illness/exacerbation    Comorbidities  HTN, HLD, prostate cancer, COPD, blind in L eye. OA and gout.    Examination-Activity Limitations  Locomotion Level;Stand;Transfers    Stability/Clinical Decision Making  Evolving/Moderate complexity    Rehab Potential  Fair    PT Frequency  2x / week    PT Duration  6 weeks    PT Treatment/Interventions  ADLs/Self Care Home Management;DME Instruction;Gait training;Functional mobility training;Neuromuscular re-education;Balance training;Therapeutic exercise;Therapeutic activities;Patient/family education;Wheelchair mobility training;Passive range of motion;Orthotic Fit/Training;Energy conservation    PT Next Visit Plan  Gait training with SPC and L AFO for safety for household mobility. Standing balance at counter top for ADLs. Standing balance with weight shifting A/P and M/L (esp towards LLE). Sit to stands. continue/review supine/seated HEP for LE strength/balance.    PT Home Exercise Plan  [id number]    Consulted and Agree with Plan of Care  Patient       Patient will benefit from skilled therapeutic intervention in order to improve the following deficits and impairments:  Abnormal gait, Decreased activity tolerance, Decreased balance, Decreased endurance, Decreased coordination, Decreased mobility, Decreased range of motion, Difficulty walking, Decreased strength, Impaired tone, Postural  dysfunction  Visit Diagnosis: Repeated falls  Unsteadiness on feet  Muscle weakness (generalized)  Difficulty in walking, not elsewhere classified  Other abnormalities of gait and mobility  Hemiplegia and hemiparesis following cerebral infarction affecting left non-dominant side [facility])     Problem List Patient Active Problem List   Diagnosis Date Noted  . Malignant neoplasm of prostate ([place]) 08/19/2018   Willow Ora, PTA, [facility] [address], [address]  [place], [place] [address] [phone] 03/09/19, 3:12 PM   Name: [name] MRN: [id number] Date of Birth: [birth year]

## 2019-03-11 ENCOUNTER — Ambulatory Visit: Payer: Medicaid Other | Admitting: Physical Therapy

## 2019-03-11 ENCOUNTER — Encounter: Payer: Self-pay | Admitting: Physical Therapy

## 2019-03-11 ENCOUNTER — Other Ambulatory Visit: Payer: Self-pay

## 2019-03-11 DIAGNOSIS — R2689 Other abnormalities of gait and mobility: Secondary | ICD-10-CM

## 2019-03-11 DIAGNOSIS — R293 Abnormal posture: Secondary | ICD-10-CM | POA: Diagnosis not present

## 2019-03-11 DIAGNOSIS — R2681 Unsteadiness on feet: Secondary | ICD-10-CM

## 2019-03-11 DIAGNOSIS — R262 Difficulty in walking, not elsewhere classified: Secondary | ICD-10-CM

## 2019-03-11 DIAGNOSIS — I69354 Hemiplegia and hemiparesis following cerebral infarction affecting left non-dominant side: Secondary | ICD-10-CM

## 2019-03-11 DIAGNOSIS — M6281 Muscle weakness (generalized): Secondary | ICD-10-CM

## 2019-03-12 NOTE — Therapy (Signed)
[facility] [address] [place], [place], [address] Phone: [phone]   Fax:  [phone]  Physical Therapy Treatment  Patient Details  Name: [name] MRN: [id number] Date of Birth: 02/20/1961 Referring Provider (PT): Ezekiel Ina, MD   Encounter Date: 03/11/2019  PT End of Session - 03/11/19 0720    Visit Number  5    Number of Visits  13   eval plus 12 visits   Date for PT Re-Evaluation  04/05/19    Authorization Type  12 visits from 8/31 through 04/11/19    Authorization - Visit Number  4    Authorization - Number of Visits  12    PT Start Time  0717    PT Stop Time  0758    PT Time Calculation (min)  41 min    Equipment Utilized During Treatment  Gait belt    Activity Tolerance  Patient tolerated treatment well    Behavior During Therapy  [facility] for tasks assessed/performed       Past Medical History:  Diagnosis Date  . Arthritis   . 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 COPD (chronic obstructive pulmonary disease) ([place])   . GERD (gastroesophageal reflux disease)   . 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 History of gout   . History of kidney stones   . Hyperlipidemia   . Hypertension   . Prostate cancer [facility]) urologist-- dr Darene Lamer. Tresa Moore;  oncologist-  dr [name]   dx 07-20-2017  . Renal calculi   . Stroke ([place]) sept 26,  20006    hx mini strokes in past, 1 large stroke  . Weakness of left side of body 03/2005   cva residual  . Weakness of one side of body    L sided weakness after stroke    Past Surgical History:  Procedure Laterality Date  . COLONOSCOPY WITH PROPOFOL N/A 09/27/2015   Procedure: COLONOSCOPY WITH PROPOFOL;  Surgeon: [name], MD;  [facility];  Service: Endoscopy;  Laterality: N/A;  . CYSTOSCOPY N/A 10/05/2012   Procedure:  CYSTOSCOPY, retrograde and right stent placement;  Surgeon: [name], MD;  [facility];  Service: Urology;  Laterality: N/A;  . CYSTOSCOPY WITH RETROGRADE PYELOGRAM, URETEROSCOPY AND STENT PLACEMENT Right 04/13/2014   Procedure: CYSTOSCOPY WITH RETROGRADE PYELOGRAM, URETEROSCOPY AND STENT PLACEMENT, LASER ABLATION OF SCAR TISSUE;  Surgeon: [name], MD;  [facility];  Service: Urology;  Laterality: Right;  . EYE SURGERY Left age 16  . HOLMIUM LASER APPLICATION Right [id number]   Procedure: HOLMIUM LASER APPLICATION;  Surgeon: [name], MD;  [facility];  Service: Urology;  Laterality: Right;  . NEPHROLITHOTOMY Right 10/05/2012   Procedure: NEPHROLITHOTOMY PERCUTANEOUS;  Surgeon: [name], MD;  [facility];  Service: Urology;  Laterality: Right;  WITH CYSTO AND SURGEON ACCESS   . NEPHROLITHOTOMY Right 10/07/2012   Procedure: NEPHROLITHOTOMY PERCUTANEOUS SECOND LOOK. Stent exchange,Ureteroscopy ;  Surgeon: [name], MD;  [facility];  Service: Urology;  Laterality: Right;  . RADIOACTIVE SEED IMPLANT N/A 11/12/2018   Procedure: RADIOACTIVE SEED IMPLANT/BRACHYTHERAPY IMPLANT;  Surgeon: [name], MD;  [facility];  Service: Urology;  Laterality: N/A;  90 MINS  . SPACE OAR INSTILLATION N/A 11/12/2018   Procedure: SPACE OAR INSTILLATION;  Surgeon: [name], MD;  [facility];  Service: Urology;  Laterality: N/A;  . TOENAIL EXCISION      There were no vitals filed for this visit.  Subjective Assessment - 03/11/19 0719    Subjective  No new complaints. Has AFO on today. No pain or falls to report.    Pertinent History  R CVA (03/26/2005); blind in L eye and low vision in R eye; HLD, HTN, COPD, prostate cancer    Limitations  Walking;Standing    How long can you walk comfortably?  to his mailbox (about 50 ft)    Patient Stated Goals  wants to get his L leg working again    Currently in Pain?  No/denies              [facility]  Adult PT Treatment/Exercise - 03/11/19 0721      Transfers   Transfers  Stand to Sit;Sit to Stand    Sit to Stand  5: Supervision;With upper extremity assist;From chair/3-in-1;From bed    Stand to Sit  5: Supervision;With upper extremity assist;To bed;To chair/3-in-1      Ambulation/Gait   Ambulation/Gait  Yes    Ambulation/Gait Assistance  5: Supervision;4: Min guard    Ambulation/Gait Assistance Details  cues for increased right step length and left stance time with gai. improved foot clearance noted with use of brace vs previous session when pt did not have brace on.     Ambulation Distance (Feet)  115 Feet   x1, plus into/out of/around gym   Assistive device  Straight cane   brace on left LE   Gait Pattern  Decreased stance time - left;Decreased step length - left;Decreased arm swing - left;Decreased stride length;Decreased weight shift to left;Decreased dorsiflexion - left;Decreased hip/knee flexion - left;Trunk flexed;Poor foot clearance - left;Wide base of support;Left circumduction;Step-through pattern    Ambulation Surface  Level;Indoor      High Level Balance   High Level Balance Activities  Side stepping;Tandem walking   tandem fwd/bwd   High Level Balance Comments  in parallel bars with single UE support- 3 laps each with light UE support on bars with cues on ex form and technique.       Knee/Hip Exercises: Aerobic   Other Aerobic  Scifit with LE's/right UE level 2.5 for 8 minutues with goal >/=40 rpm for strengthening and activity tolerance.           Balance Exercises - 03/11/19 0738      Balance Exercises: Standing   Standing Eyes Closed  Narrow base of support (BOS);Wide (BOA);Head turns;Foam/compliant surface;Other reps (comment);30 secs;Limitations    Balance Beam  standing across red foam beam: alternating fwd stepping to floor/back onto beam, then alternating bwd stepping to floor/back onto beam with intermittent right UE support for balance. 10 reps each on  each side with min guard to min assist, cues for increased step length/height.        Balance Exercises: Standing   Standing Eyes Closed Limitations  on airex with no UE support- feet together EC no head movements, progressing to EC head movements right<>left, up<>down, and diagonals both ways. up to min assist for balance.           PT Short Term Goals - 02/15/19 1300      PT SHORT TERM GOAL #1   Title  Patient will be independent with initial HEP for balance and LE strenghtening in order to build upon functional gains in therapy. ALL STGS DUE BY 6TH VISIT.  Baseline  currently dependent    Time  6    Period  --   6th visit   Status  New    Target Date  --      PT SHORT TERM GOAL #2   Title  Patient will decrease TUG score using single point cane to at least 53 seconds in order to decrease risk of falls.    Baseline  59.16 seconds on 02/15/19    Time  --    Period  --    Status  New      PT SHORT TERM GOAL #3   Title  Patient will ambulate 56' with supervision with single point cane in order to improve mobility for household distances.    Baseline  min guard/ min A with single point cane on 02/15/19    Time  --    Period  --    Status  New      PT SHORT TERM GOAL #4   Title  Patient will perform w/c mobility with power w/c outside over paved/cement surfaces for 250' with mod I in order to improve community mobility and safety.    Baseline  not yet assessed; pt states he will be receiving new power w/c this week    Time  --    Period  --    Status  New      PT SHORT TERM GOAL #5   Title  Patient will tolerate static standing balance x3 minutes at countertop with supervision in order to increase endurance for ADLs.    Baseline  not yet assessed.    Time  --    Period  --    Status  New        PT Long Term Goals - 02/15/19 1304      PT LONG TERM GOAL #1   Title  Patient will be independent with initial HEP for balance and LE strengthening in order to build upon  functional gains in therapy. ALL LTGs DUE 04/05/19    Baseline  dependent with HEP    Time  6    Period  Weeks    Status  New    Target Date  04/05/19      PT LONG TERM GOAL #2   Title  Patient will ambulate 29' with supervision with single point cane in order to improve mobility for household distances.    Baseline  min guard/ min A with single point cane on 02/15/19    Time  --    Period  --    Status  New      PT LONG TERM GOAL #3   Title  Patient will tolerate static standing balance x5 minutes at countertop with supervision in order to increase endurance for ADLs.    Baseline  not yet assessed    Time  --    Period  --    Status  New      PT LONG TERM GOAL #4   Title  Patient will decrease TUG score using single point cane to at least 49 seconds in order to decrease risk of falls.    Baseline  59.16 seconds on 02/15/19    Time  --    Period  --    Status  New      PT LONG TERM GOAL #5   Title  Patient will perform at least 4 sit <> stands from arm chair with UE support during 30 second chair  in order to demonstrate improved LE strength.    Baseline  2 sit <> stands during 30 seconds    Time  --    Period  --    Status  New      Additional Long Term Goals   Additional Long Term Goals  Yes      PT LONG TERM GOAL #6   Title  Patient will perform w/c mobility with power w/c outside over paved/cement surfaces and around obstacles for 500' with mod I in order to improve community mobility and safety.    Baseline  not yet assessed    Time  --    Period  --    Status  New      PT LONG TERM GOAL #7   Title  Patient will perform stand pivot t/f from power w/c <> mat table using single point cane and mod I in order to improve safety at home.    Baseline  not yet assessed    Time  --    Period  --    Status  New            Plan - 03/11/19 0721    Clinical Impression Statement  Today's skilled session focused on strengthening, gait and balance reactions with rest  breaks needed due to fatigue. No pain reported. The pt is progressing well toward goals and should benefit from continued PT to progress toward unmet goals.    Personal Factors and Comorbidities  Comorbidity 3+;Past/Current Experience;Time since onset of injury/illness/exacerbation    Comorbidities  HTN, HLD, prostate cancer, COPD, blind in L eye. OA and gout.    Examination-Activity Limitations  Locomotion Level;Stand;Transfers    Stability/Clinical Decision Making  Evolving/Moderate complexity    Rehab Potential  Fair    PT Frequency  2x / week    PT Duration  6 weeks    PT Treatment/Interventions  ADLs/Self Care Home Management;DME Instruction;Gait training;Functional mobility training;Neuromuscular re-education;Balance training;Therapeutic exercise;Therapeutic activities;Patient/family education;Wheelchair mobility training;Passive range of motion;Orthotic Fit/Training;Energy conservation    PT Next Visit Plan  STGs due next visit (#6); continue to work on gait training wiht AFO (when he wears it). continue to work on activity tolerance, LE strengthening and balanace reactions.    PT Home Exercise Plan  [email or web address]    Consulted and Agree with Plan of Care  Patient       Patient will benefit from skilled therapeutic intervention in order to improve the following deficits and impairments:  Abnormal gait, Decreased activity tolerance, Decreased balance, Decreased endurance, Decreased coordination, Decreased mobility, Decreased range of motion, Difficulty walking, Decreased strength, Impaired tone, Postural dysfunction  Visit Diagnosis: Unsteadiness on feet  Muscle weakness (generalized)  Difficulty in walking, not elsewhere classified  Other abnormalities of gait and mobility  Hemiplegia and hemiparesis following cerebral infarction affecting left non-dominant side [facility])     Problem List Patient Active Problem List   Diagnosis Date Noted  . Malignant neoplasm of prostate ([place])  08/19/2018    Willow Ora, PTA, [facility] [address] [place], [place] [address] [phone] 03/12/19, 9:49 AM   Name: [name] MRN: [id number] Date of Birth: 04-20-1961

## 2019-03-15 ENCOUNTER — Ambulatory Visit: Payer: Medicaid Other | Admitting: Occupational Therapy

## 2019-03-15 ENCOUNTER — Encounter: Payer: Self-pay | Admitting: Physical Therapy

## 2019-03-15 ENCOUNTER — Other Ambulatory Visit: Payer: Self-pay

## 2019-03-15 ENCOUNTER — Ambulatory Visit: Payer: Medicaid Other | Admitting: Physical Therapy

## 2019-03-15 ENCOUNTER — Encounter: Payer: Self-pay | Admitting: Occupational Therapy

## 2019-03-15 DIAGNOSIS — M25642 Stiffness of left hand, not elsewhere classified: Secondary | ICD-10-CM

## 2019-03-15 DIAGNOSIS — M6281 Muscle weakness (generalized): Secondary | ICD-10-CM

## 2019-03-15 DIAGNOSIS — R262 Difficulty in walking, not elsewhere classified: Secondary | ICD-10-CM

## 2019-03-15 DIAGNOSIS — R29898 Other symptoms and signs involving the musculoskeletal system: Secondary | ICD-10-CM

## 2019-03-15 DIAGNOSIS — R296 Repeated falls: Secondary | ICD-10-CM

## 2019-03-15 DIAGNOSIS — R29818 Other symptoms and signs involving the nervous system: Secondary | ICD-10-CM

## 2019-03-15 DIAGNOSIS — R2681 Unsteadiness on feet: Secondary | ICD-10-CM

## 2019-03-15 DIAGNOSIS — M25612 Stiffness of left shoulder, not elsewhere classified: Secondary | ICD-10-CM

## 2019-03-15 DIAGNOSIS — R293 Abnormal posture: Secondary | ICD-10-CM

## 2019-03-15 DIAGNOSIS — R2689 Other abnormalities of gait and mobility: Secondary | ICD-10-CM

## 2019-03-15 NOTE — Therapy (Signed)
[facility] [address] [place], [place], [address] Phone: [phone]   Fax:  [phone]  Physical Therapy Treatment  Patient Details  Name: [name] MRN: [id number] Date of Birth: July 29, 1960 Referring Provider (PT): Ezekiel Ina, MD   Encounter Date: 03/15/2019  PT End of Session - 03/15/19 1004    Visit Number  6    Number of Visits  13   eval plus 12 visits   Date for PT Re-Evaluation  04/05/19    Authorization Type  12 visits from 8/31 through 04/11/19    Authorization - Visit Number  4    Authorization - Number of Visits  12    PT Start Time  0718    PT Stop Time  0801    PT Time Calculation (min)  43 min    Equipment Utilized During Treatment  Gait belt    Activity Tolerance  Patient tolerated treatment well    Behavior During Therapy  [facility] for tasks assessed/performed       Past Medical History:  Diagnosis Date  . Arthritis   . 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 COPD (chronic obstructive pulmonary disease) ([place])   . GERD (gastroesophageal reflux disease)   . 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 History of gout   . History of kidney stones   . Hyperlipidemia   . Hypertension   . Prostate cancer [facility]) urologist-- dr Darene Lamer. Tresa Moore;  oncologist-  dr [name]   dx 07-20-2017  . Renal calculi   . Stroke ([place]) sept 26,  20006    hx mini strokes in past, 1 large stroke  . Weakness of left side of body 03/2005   cva residual  . Weakness of one side of body    L sided weakness after stroke    Past Surgical History:  Procedure Laterality Date  . COLONOSCOPY WITH PROPOFOL N/A 09/27/2015   Procedure: COLONOSCOPY WITH PROPOFOL;  Surgeon: [name], MD;  [facility]: [facility];  Service: Endoscopy;  Laterality: N/A;  . CYSTOSCOPY N/A 10/05/2012   Procedure:  CYSTOSCOPY, retrograde and right stent placement;  Surgeon: [name], MD;  Location: [facility];  Service: Urology;  Laterality: N/A;  . CYSTOSCOPY WITH RETROGRADE PYELOGRAM, URETEROSCOPY AND STENT PLACEMENT Right 04/13/2014   Procedure: CYSTOSCOPY WITH RETROGRADE PYELOGRAM, URETEROSCOPY AND STENT PLACEMENT, LASER ABLATION OF SCAR TISSUE;  Surgeon: [name], MD;  [facility];  Service: Urology;  Laterality: Right;  . EYE SURGERY Left age 9  . HOLMIUM LASER APPLICATION Right 01/77/9390   Procedure: HOLMIUM LASER APPLICATION;  Surgeon: [name], MD;  [facility];  Service: Urology;  Laterality: Right;  . NEPHROLITHOTOMY Right 10/05/2012   Procedure: NEPHROLITHOTOMY PERCUTANEOUS;  Surgeon: [name], MD;  [facility];  Service: Urology;  Laterality: Right;  WITH CYSTO AND SURGEON ACCESS   . NEPHROLITHOTOMY Right 10/07/2012   Procedure: NEPHROLITHOTOMY PERCUTANEOUS SECOND LOOK. Stent exchange,Ureteroscopy ;  Surgeon: [name], MD;  [facility];  Service: Urology;  Laterality: Right;  . RADIOACTIVE SEED IMPLANT N/A 11/12/2018   Procedure: RADIOACTIVE SEED IMPLANT/BRACHYTHERAPY IMPLANT;  Surgeon: [name], MD;  [facility];  Service: Urology;  Laterality: N/A;  90 MINS  . SPACE OAR INSTILLATION N/A 11/12/2018   Procedure: SPACE OAR INSTILLATION;  Surgeon: [name], MD;  [facility]:  [facility];  Service: Urology;  Laterality: N/A;  . TOENAIL EXCISION      There were no vitals filed for this visit.  Subjective Assessment - 03/15/19 0720    Subjective  Running late today and is not wearing his AFO. States that his L ankle has been hurting after PT last time. No falls or almost falls.    Pertinent History  R CVA (03/26/2005); blind in L eye and low vision in R eye; HLD, HTN, COPD, prostate cancer    Limitations  Walking;Standing    How long can you walk comfortably?  to his mailbox (about 50 ft)    Patient Stated Goals  wants to get his L leg working  again    Currently in Pain?  Yes    Pain Score  5     Pain Location  Ankle    Pain Orientation  Left    Pain Relieving Factors  not putting any weight on it for a while         [facility] PT Assessment - 03/15/19 0001      Timed Up and Go Test   Normal TUG (seconds)  29.88    TUG Comments  using SPC, no L AFO                [facility] Adult PT Treatment/Exercise - 03/15/19 0001      Ambulation/Gait   Ambulation/Gait  Yes    Ambulation/Gait Assistance  5: Supervision;4: Min guard    Ambulation/Gait Assistance Details  Pt did not wear his brace today, slower gait speed today and decreased weight shifting to LLE    Ambulation Distance (Feet)  70 Feet   approx. throughout session   Assistive device  Straight cane    Gait Pattern  Decreased stance time - left;Decreased step length - left;Decreased arm swing - left;Decreased stride length;Decreased weight shift to left;Decreased dorsiflexion - left;Decreased hip/knee flexion - left;Trunk flexed;Poor foot clearance - left;Wide base of support;Left circumduction;Step-through pattern    Ambulation Surface  Level;Indoor      Balance   Balance Assessed  Yes      Self-Care   Self-Care  Other Self-Care Comments    Other Self-Care Comments   Discussed pt's power w/c (after pt had questions about it to primary PT a couple sessions ago). Primary PT asked another PT at this clinic what would be the best plan -Due to Medicaid guidelines with a power w/c and that pt had just received a new one, therapist suggesting that they will try to figure out a time where pt can receive transportation to appointment (to bring power w/c with him) and will try to see if pt's seat height could be adjusted on current chair. Pt verbalized understanding.       Exercises   Exercises  Other Exercises    Other Exercises   Reviewed pt's current HEP - pt unable to perform bridging correctly without assistance for LLE to be flat on the mat table. Pt has not been  performing at home with his aide. Therapist discussed with pt that on mornings when he is not at therapy to do the supine exercises with his aide present to make sure that he is doing the correct form. Pt needed mod-max cueing for quad set exercise and to prevent LLE from externally rotating. Reviewed and performing sitting hip ADD with pillow 1 x 10 reps (pt stating he has not been doing this exercise at home). Educated pt on importance of  getting up/performing sit <> stands throughout the day if he is watching TV to increase circulation and blood flow.           Balance Exercises - 03/15/19 1127      Balance Exercises: Standing   Other Standing Exercises  Static standing balance at countertop with RUE x3 minutes with supervision - to increase tolerance for ADLs. At countertop with RUE support: performed medial lateral weight shifting 2 x 10 reps - initially with therapist facilitation for weight shifting towards LLE. Staggered stance weight shifting B - multimodal cues for technique and sequencing, and for upright posture, 1 x 10 reps each. Both exercises added to pt's HEP.          Access Code: [email or web address]  URL: [email or web address]  Date: 03/15/2019  Prepared by: Janann August   Exercises Supine Bridge - 5 reps - 3 sets - 1x daily - 5x weekly Seated Hip Adduction Isometrics with Ball - 5 reps - 3 sets - 2x daily - 5x weekly Bent Knee Fallouts - 5 reps - 3 sets - 2x daily - 7x weekly Supine Quad Set - 10 reps - 2 sets - 2x daily - 7x weekly Standing Weight Shift Side to Side - 10 reps - 2 sets - 2x daily - 7x weekly --> new addition to HEP Staggered Stance Forward Backward Weight Shift with Counter Support - 10 reps - 3 sets - 1x daily - 7x weekly --> new addition to HEP    PT Education - 03/15/19 0937    Education Details  progress towards goals, weight shifting additions to HEP, performing supine exercises only with aide there to ensure proper form and technique     Person(s) Educated  Patient    Methods  Explanation;Demonstration;Handout    Comprehension  Verbalized understanding;Returned demonstration       PT Short Term Goals - 03/15/19 0731      PT SHORT TERM GOAL #1   Title  Patient will be independent with initial HEP for balance and LE strenghtening in order to build upon functional gains in therapy. ALL STGS DUE BY 6TH VISIT.    Baseline  needs assistance getting into position for bridging, needs max cueing for quad sets.    Time  6    Period  --   6th visit   Status  Not Met      PT SHORT TERM GOAL #2   Title  Patient will decrease TUG score using single point cane to at least 53 seconds in order to decrease risk of falls.    Baseline  29.88 seconds on 03/15/19 with SPC and not using AFO    Status  Achieved      PT SHORT TERM GOAL #3   Title  Patient will ambulate 27' with supervision with single point cane in order to improve mobility for household distances.    Baseline  supervision/min guard for 20' with use of SPC, no AFO    Status  Partially Met      PT SHORT TERM GOAL #4   Title  Patient will perform w/c mobility with power w/c outside over paved/cement surfaces for 250' with mod I in order to improve community mobility and safety.    Baseline  pt not able to bring in power w/c to session due to pt driving to and from appointments    Status  Not Met      PT SHORT TERM GOAL #5   Title  Patient will tolerate  static standing balance x3 minutes at countertop with supervision in order to increase endurance for ADLs.    Baseline  met on 03/15/19 with single RUE support    Status  Achieved        PT Long Term Goals - 03/15/19 1006      PT LONG TERM GOAL #1   Title  Patient will be independent with initial HEP for balance and LE strengthening in order to build upon functional gains in therapy. ALL LTGs DUE 04/05/19    Baseline  dependent with HEP    Time  6    Period  Weeks    Status  New      PT LONG TERM GOAL #2   Title   Patient will ambulate 32-80' with supervision with single point  cane and AFO in order to improve mobility for household distances.    Baseline  min guard/ min A with single point cane on 02/15/19    Status  New      PT LONG TERM GOAL #3   Title  Patient will tolerate static standing balance x5 minutes at countertop with supervision in order to increase endurance for ADLs.    Baseline  not yet assessed    Status  New      PT LONG TERM GOAL #4   Title  Patient will decrease TUG score using single point cane and AFO to at least 25 seconds in order to decrease risk of falls.    Baseline  29.88 seconds on 02/15/19    Status  Revised      PT LONG TERM GOAL #5   Title  Patient will perform at least 4 sit <> stands from arm chair with UE support during 30 second chair in order to demonstrate improved LE strength.    Baseline  2 sit <> stands during 30 seconds    Status  New      PT LONG TERM GOAL #6   Title  Patient will perform w/c mobility with power w/c outside over paved/cement surfaces and around obstacles for 500' with mod I in order to improve community mobility and safety.    Baseline  not yet assessed    Status  New      PT LONG TERM GOAL #7   Title  Patient will perform stand pivot t/f from power w/c <> mat table using single point cane and mod I in order to improve safety at home.    Baseline  not yet assessed    Status  New            Plan - 03/15/19 1151    Clinical Impression Statement  Today's session focused on assessing STGs. Pt has met STG #2 and #5. Upgraded pt's LTG due to pt's 30 second decrease time in TUG score using SPC but no AFO - however, his time continues to put him at a higher risk for falls. Pt needs mod-max cueing for supine ther ex and assist from therapist for pt to remain in proper positioning - educated pt to perform supine exercises on mornings when aide is present to assist with positioning. Pt did not have his AFO on today to assist with foot  clearance - partially met STG #3, pt required min guard with gait with SPC. At beginning of session, pt reported 5/10 L ankle pain. After weight shifting, ambulating, and standing exercises, pt's pain decreased when he was done with session. Will continue to progress towards LTGs.  Personal Factors and Comorbidities  Comorbidity 3+;Past/Current Experience;Time since onset of injury/illness/exacerbation    Comorbidities  HTN, HLD, prostate cancer, COPD, blind in L eye. OA and gout.    Examination-Activity Limitations  Locomotion Level;Stand;Transfers    Stability/Clinical Decision Making  Evolving/Moderate complexity    Rehab Potential  Fair    PT Frequency  2x / week    PT Duration  6 weeks    PT Treatment/Interventions  ADLs/Self Care Home Management;DME Instruction;Gait training;Functional mobility training;Neuromuscular re-education;Balance training;Therapeutic exercise;Therapeutic activities;Patient/family education;Wheelchair mobility training;Passive range of motion;Orthotic Fit/Training;Energy conservation    PT Next Visit Plan  Contiue to work on gait training wiht AFO (when he wears it). continue to work on activity tolerance and standing weight shifting, LE strengthening and balanace reactions.    PT Home Exercise Plan  [email or web address]    Consulted and Agree with Plan of Care  Patient       Patient will benefit from skilled therapeutic intervention in order to improve the following deficits and impairments:  Abnormal gait, Decreased activity tolerance, Decreased balance, Decreased endurance, Decreased coordination, Decreased mobility, Decreased range of motion, Difficulty walking, Decreased strength, Impaired tone, Postural dysfunction  Visit Diagnosis: Unsteadiness on feet  Muscle weakness (generalized)  Difficulty in walking, not elsewhere classified  Other abnormalities of gait and mobility  Repeated falls  Abnormal posture  Other symptoms and signs involving the nervous  system     Problem List Patient Active Problem List   Diagnosis Date Noted  . Malignant neoplasm of prostate ([place]) 08/19/2018    Arliss Journey, PT, DPT 03/15/2019, 11:58 AM  [facility] [address] [place], [place], [address] Phone: [phone]   Fax:  [phone]  Name: [name] MRN: [id number] Date of Birth: 19-May-1961

## 2019-03-15 NOTE — Therapy (Signed)
[facility] [address] [place], [place], [address] Phone: [phone]   Fax:  [phone]  Occupational Therapy Treatment  Patient Details  Name: [name] MRN: [id number] Date of Birth: 1961-05-14 Referring Provider (OT): Dr, Jeanie Cooks   Encounter Date: 03/15/2019  OT End of Session - 03/15/19 1019    Visit Number  4    Number of Visits  6   eval plus 5 treatments   Date for OT Re-Evaluation  04/05/19    Authorization Type  Medicaid 3 visits approved through 03/21/2019.  Submitted for remaining 2 visits and await approval    Authorization - Visit Number  4    Authorization - Number of Visits  6    OT Start Time  0802    OT Stop Time  9417    OT Time Calculation (min)  42 min    Activity Tolerance  Patient tolerated treatment well       Past Medical History:  Diagnosis Date  . Arthritis   . 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 COPD (chronic obstructive pulmonary disease) ([place])   . GERD (gastroesophageal reflux disease)   . 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 History of gout   . History of kidney stones   . Hyperlipidemia   . Hypertension   . Prostate cancer [facility]) urologist-- dr Darene Lamer. Tresa Moore;  oncologist-  dr [name]   dx 07-20-2017  . Renal calculi   . Stroke ([place]) sept 26,  20006    hx mini strokes in past, 1 large stroke  . Weakness of left side of body 03/2005   cva residual  . Weakness of one side of body    L sided weakness after stroke    Past Surgical History:  Procedure Laterality Date  . COLONOSCOPY WITH PROPOFOL N/A 09/27/2015   Procedure: COLONOSCOPY WITH PROPOFOL;  Surgeon: [name], MD;  [facility];  Service: Endoscopy;  Laterality: N/A;  . CYSTOSCOPY N/A 10/05/2012   Procedure: CYSTOSCOPY, retrograde and right stent placement;   Surgeon: [name], MD;  [facility];  Service: Urology;  Laterality: N/A;  . CYSTOSCOPY WITH RETROGRADE PYELOGRAM, URETEROSCOPY AND STENT PLACEMENT Right 04/13/2014   Procedure: CYSTOSCOPY WITH RETROGRADE PYELOGRAM, URETEROSCOPY AND STENT PLACEMENT, LASER ABLATION OF SCAR TISSUE;  Surgeon: [name], MD;  [facility];  Service: Urology;  Laterality: Right;  . EYE SURGERY Left age 50  . HOLMIUM LASER APPLICATION Right 40/81/4481   Procedure: HOLMIUM LASER APPLICATION;  Surgeon: [name], MD;  [facility];  Service: Urology;  Laterality: Right;  . NEPHROLITHOTOMY Right 10/05/2012   Procedure: NEPHROLITHOTOMY PERCUTANEOUS;  Surgeon: [name], MD;  [facility];  Service: Urology;  Laterality: Right;  WITH CYSTO AND SURGEON ACCESS   . NEPHROLITHOTOMY Right 10/07/2012   Procedure: NEPHROLITHOTOMY PERCUTANEOUS SECOND LOOK. Stent exchange,Ureteroscopy ;  Surgeon: [name], MD;  [facility];  Service: Urology;  Laterality: Right;  . RADIOACTIVE SEED IMPLANT N/A 11/12/2018   Procedure: RADIOACTIVE SEED IMPLANT/BRACHYTHERAPY IMPLANT;  Surgeon: [name], MD;  [facility];  Service: Urology;  Laterality: N/A;  90 MINS  . SPACE OAR INSTILLATION N/A 11/12/2018   Procedure: SPACE OAR INSTILLATION;  Surgeon: [name], MD;  [facility];  Service: Urology;  Laterality: N/A;  . TOENAIL EXCISION  There were no vitals filed for this visit.  Subjective Assessment - 03/15/19 0804    Pertinent History  R CVA in 03/26/2005.  OFB:PZWC, HLD, HTN, blind in L eye, prostate cancer, OA and gout    Patient Stated Goals  I want to get stronger    Currently in Pain?  Yes    Pain Score  3     Pain Location  Ankle    Pain Orientation  Left    Pain Descriptors / Indicators  Aching    Pain Type  Chronic pain    Pain Onset  More than a month ago    Pain Frequency  Intermittent    Aggravating Factors   I think its because of poor circulation its always  worse in the morning    Pain Relieving Factors  once I start moving around and get the circulation going it starts easing off                   OT Treatments/Exercises (OP) - 03/15/19 0001      Splinting   Splinting  Adjusted resting hand splint for LUE - practiced donning and doffing splint. Pt with increased ease if LUE is supported on pillow sitting at table. Pt instructed to allow hand to relax into splint prior to placing straps. Pt verbalized understanding and able to return demonstrate.  Also educated pt on wearing schedule, what to monitor and care of splint. Provided info in writing and pt able to verbalize understanding.              OT Education - 03/15/19 1015    Education Details  wear and care of resting hand splint for LUE    Person(s) Educated  Patient    Methods  Explanation;Demonstration;Handout    Comprehension  Verbalized understanding;Returned demonstration       OT Short Term Goals - 03/15/19 1016      OT SHORT TERM GOAL #1   Title  Pt will be mod I for basic HEP for LUE - 03/29/2019 (date adjusted to allow for Medicaid approval)    Baseline  dependent    Status  Achieved      OT SHORT TERM GOAL #2   Title  Pt will be mod I for splint wear and care    Baseline  dependent    Status  Achieved        OT Long Term Goals - 03/15/19 1016      OT LONG TERM GOAL #1   Title  Pt will be mod I with upgraded HEP for LUE - 04/05/2019 (date adjusted to allow for Medicaid approval)    Baseline  dependent    Status  On-going      OT LONG TERM GOAL #2   Title  Pt will demonstate understanding of spasticity mgmt techniques for LUE    Baseline  dependent    Status  On-going      OT LONG TERM GOAL #4   Title  Pt will demonstrate ability for shoulder flexion to 100* with 0/10 pain for HEP    Baseline  90* of shoulder flexion    Status  On-going            Plan - 03/15/19 1016    Clinical Impression Statement  Pt has met STG's and is now  progressing to LTG's. Pt initially approved for 3 OT visits and will submit for approval of remaining 2 OT visits per POC.  Pt  has information to call for appt with rehab md for spasiticity mgmt and referral has been made by primary MD.  Contact info for rehab MD provided in writing and message sent to MD and his scheduler that pt would be calling    OT Occupational Profile and History  Detailed Assessment- Review of Records and additional review of physical, cognitive, psychosocial history related to current functional performance    Occupational performance deficits (Please refer to evaluation for details):  ADL's;IADL's    Body Structure / Function / Physical Skills  ADL;Edema;Sensation;ROM    Rehab Potential  Good    Clinical Decision Making  Limited treatment options, no task modification necessary    Modification or Assistance to Complete Evaluation   Min-Moderate modification of tasks or assist with assess necessary to complete eval    OT Frequency  1x / week    OT Duration  Other (comment)   eval plus 5 weeks   OT Treatment/Interventions  Self-care/ADL training;Therapeutic exercise;Neuromuscular education;Passive range of motion;Splinting;Therapeutic activities;Patient/family education    Plan  check on splint wearing; upgrade HEP as possible    Consulted and Agree with Plan of Care  Patient       Patient will benefit from skilled therapeutic intervention in order to improve the following deficits and impairments:   Body Structure / Function / Physical Skills: ADL, Edema, Sensation, ROM       Visit Diagnosis: Other symptoms and signs involving the musculoskeletal system  Stiffness of left hand, not elsewhere classified  Other symptoms and signs involving the nervous system  Stiffness of left shoulder, not elsewhere classified    Problem List Patient Active Problem List   Diagnosis Date Noted  . Malignant neoplasm of prostate ([place]) 08/19/2018    Quay Burow,  OTR/L 03/15/2019, 11:56 AM  [facility] [address] [place], [place], [address] Phone: [phone]   Fax:  [phone]  Name: RAJAN BURGARD MRN: [id number] Date of Birth: 05/14/1961

## 2019-03-15 NOTE — Patient Instructions (Signed)
Your Splint This splint should initially be fitted by a healthcare practitioner.  The healthcare practitioner is responsible for providing wearing instructions and precautions to the patient, other healthcare practitioners and care provider involved in the patient's care.  This splint was custom made for you. Please read the following instructions to learn about wearing and caring for your splint.  Precautions Should your splint cause any of the following problems, remove the splint immediately, STOP wearing it and contact your therapist.   Swelling  Severe Pain  Pressure Areas  Stiffness  Numbness   When To Wear Your Splint: At first you will wear your splint for short periods of time during the day to build up a tolerance to it.  Here is what you will do: Monday: Day One you will wear it one hour in the morning and one hour in the late afternoon. Check your skin - If no problems then Tuesday: Day Two you will wear it TWO hours in the morning and TWO hours in the late afternoon. If no problems then Wednesday: Day Three you will wear it THREE hours in the morning and THREE hours in the afternoon. If no problems then Thursday: Day Four you will wear it FOUR hours in the morning and FOUR hours in the afternoon. If no problems then  Friday: Day Five you will ONLY wear it when you go to sleep at night. Do not wear it during the day at this point.   Care and Cleaning of Your Splint 1. Keep your splint away from open flames. 2. Your splint will lose its shape in temperatures over 135 degrees Farenheit, ( in car windows, near radiators, ovens or in hot water).  Never make any adjustments to your splint, if the splint needs adjusting remove it and make an appointment to see your therapist. 3. Your splint, including the cushion liner may be cleaned with soap and lukewarm water.  Do not immerse in hot water over 135 degrees Farenheit. 4. Straps may be washed with soap and water, but do not moisten  the self-adhesive portion. 5. For ink or hard to remove spots use a scouring cleanser which contains chlorine.  Rinse the splint thoroughly after using chlorine cleanser.

## 2019-03-17 ENCOUNTER — Encounter: Payer: Self-pay | Admitting: Physical Therapy

## 2019-03-17 ENCOUNTER — Ambulatory Visit: Payer: Medicaid Other | Admitting: Physical Therapy

## 2019-03-17 ENCOUNTER — Other Ambulatory Visit: Payer: Self-pay

## 2019-03-17 DIAGNOSIS — R262 Difficulty in walking, not elsewhere classified: Secondary | ICD-10-CM

## 2019-03-17 DIAGNOSIS — R293 Abnormal posture: Secondary | ICD-10-CM

## 2019-03-17 DIAGNOSIS — R2681 Unsteadiness on feet: Secondary | ICD-10-CM

## 2019-03-17 DIAGNOSIS — R29818 Other symptoms and signs involving the nervous system: Secondary | ICD-10-CM

## 2019-03-17 DIAGNOSIS — M6281 Muscle weakness (generalized): Secondary | ICD-10-CM

## 2019-03-17 DIAGNOSIS — R296 Repeated falls: Secondary | ICD-10-CM

## 2019-03-17 DIAGNOSIS — R2689 Other abnormalities of gait and mobility: Secondary | ICD-10-CM

## 2019-03-17 NOTE — Patient Instructions (Signed)
Access Code: [email or web address]  URL: [email or web address]  Date: 03/17/2019  Prepared by: Janann August   Exercises Supine Bridge - 5 reps - 3 sets - 1x daily - 5x weekly Seated Hip Adduction Isometrics with Ball - 5 reps - 3 sets - 2x daily - 5x weekly Bent Knee Fallouts - 5 reps - 3 sets - 2x daily - 7x weekly Supine Quad Set - 10 reps - 2 sets - 2x daily - 7x weekly Standing Weight Shift Side to Side - 10 reps - 2 sets - 2x daily - 7x weekly Staggered Stance Forward Backward Weight Shift with Counter Support - 10 reps - 3 sets - 1x daily - 7x weekly Sit to Stand with Counter Support - 5 reps - 2 sets - 2x daily - 7x weekly

## 2019-03-17 NOTE — Therapy (Signed)
[facility] [address] [place], [place], [address] Phone: [phone]   Fax:  [phone]  Physical Therapy Treatment  Patient Details  Name: [name] MRN: [id number] Date of Birth: 1961-04-18 Referring Provider (PT): Ezekiel Ina, MD   Encounter Date: 03/17/2019  PT End of Session - 03/17/19 0854    Visit Number  7    Number of Visits  13   eval plus 12 visits   Date for PT Re-Evaluation  04/05/19    Authorization Type  12 visits from 8/31 through 04/11/19    Authorization - Visit Number  4    Authorization - Number of Visits  12    PT Start Time  0804    PT Stop Time  0845    PT Time Calculation (min)  41 min    Equipment Utilized During Treatment  Gait belt    Activity Tolerance  Patient tolerated treatment well    Behavior During Therapy  [facility] for tasks assessed/performed       Past Medical History:  Diagnosis Date  . Arthritis   . 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 COPD (chronic obstructive pulmonary disease) ([place])   . GERD (gastroesophageal reflux disease)   . 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 History of gout   . History of kidney stones   . Hyperlipidemia   . Hypertension   . Prostate cancer [facility]) urologist-- dr Darene Lamer. Tresa Moore;  oncologist-  dr [name]   dx 07-20-2017  . Renal calculi   . Stroke ([place]) sept 26,  20006    hx mini strokes in past, 1 large stroke  . Weakness of left side of body 03/2005   cva residual  . Weakness of one side of body    L sided weakness after stroke    Past Surgical History:  Procedure Laterality Date  . COLONOSCOPY WITH PROPOFOL N/A 09/27/2015   Procedure: COLONOSCOPY WITH PROPOFOL;  Surgeon: [name], MD;  [facility];  Service: Endoscopy;  Laterality: N/A;  . CYSTOSCOPY N/A 10/05/2012   Procedure:  CYSTOSCOPY, retrograde and right stent placement;  Surgeon: [name], MD;  [facility];  Service: Urology;  Laterality: N/A;  . CYSTOSCOPY WITH RETROGRADE PYELOGRAM, URETEROSCOPY AND STENT PLACEMENT Right 04/13/2014   Procedure: CYSTOSCOPY WITH RETROGRADE PYELOGRAM, URETEROSCOPY AND STENT PLACEMENT, LASER ABLATION OF SCAR TISSUE;  Surgeon: [name], MD;  [facility];  Service: Urology;  Laterality: Right;  . EYE SURGERY Left age 4  . HOLMIUM LASER APPLICATION Right 87/68/1157   Procedure: HOLMIUM LASER APPLICATION;  Surgeon: [name], MD;  [facility];  Service: Urology;  Laterality: Right;  . NEPHROLITHOTOMY Right 10/05/2012   Procedure: NEPHROLITHOTOMY PERCUTANEOUS;  Surgeon: [name], MD;  [facility];  Service: Urology;  Laterality: Right;  WITH CYSTO AND SURGEON ACCESS   . NEPHROLITHOTOMY Right 10/07/2012   Procedure: NEPHROLITHOTOMY PERCUTANEOUS SECOND LOOK. Stent exchange,Ureteroscopy ;  Surgeon: [name], MD;  [facility];  Service: Urology;  Laterality: Right;  . RADIOACTIVE SEED IMPLANT N/A 11/12/2018   Procedure: RADIOACTIVE SEED IMPLANT/BRACHYTHERAPY IMPLANT;  Surgeon: [name], MD;  [facility];  Service: Urology;  Laterality: N/A;  90 MINS  . SPACE OAR INSTILLATION N/A 11/12/2018   Procedure: SPACE OAR INSTILLATION;  Surgeon: [name], MD;  [facility];  Service: Urology;  Laterality: N/A;  . TOENAIL EXCISION      There were no vitals filed for this visit.  Subjective Assessment - 03/17/19 0807    Subjective  L ankle is feeling better today. Wearing his AFO. No falls. Wants to review the standing exercises that we did last time.    Pertinent History  R CVA (03/26/2005); blind in L eye and low vision in R eye; HLD, HTN, COPD, prostate cancer    Limitations  Walking;Standing    How long can you walk comfortably?  to his mailbox (about 50 ft)    Patient Stated Goals  wants to get his L leg working again     Currently in Pain?  No/denies                       [facility] Adult PT Treatment/Exercise - 03/17/19 0001      Transfers   Transfers  Stand to Sit;Sit to Stand    Sit to Stand  5: Supervision    Sit to Stand Details  Verbal cues for sequencing;Verbal cues for technique;Visual cues/gestures for sequencing    Sit to Stand Details (indicate cue type and reason)  Sit <> stands massed practice at countertop from chair with RUE pushing off from chair. Cues to keep feet even and feet apart to increase weight bearing through LLE. Pt with tendency to perform with RLE staggered behind L.        Ambulation/Gait   Ambulation/Gait  Yes    Ambulation/Gait Assistance  5: Supervision;4: Min guard    Ambulation/Gait Assistance Details  Pt wore L AFO today. Cues for increased right step length and sequencing with cane. Pt with occassional difficulty with keeping cane with LLE.    Ambulation Distance (Feet)  150 Feet   approx. throughout session   Assistive device  Straight cane    Gait Pattern  Decreased stance time - left;Decreased step length - left;Decreased arm swing - left;Decreased stride length;Decreased weight shift to left;Decreased dorsiflexion - left;Decreased hip/knee flexion - left;Trunk flexed;Poor foot clearance - left;Wide base of support;Left circumduction;Step-through pattern    Ambulation Surface  Level;Indoor      Neuro Re-ed    Neuro Re-ed Details   Reviewed standing weight shifting exercise from prior HEP: 1 x 10 reps lateral weight shifting, 2 x 10 reps each A/P weight shifting in staggered stance - with cues for upright posture when shifting forward and shifting weight posteriorly on back leg. All performed at countertop with min guard and RUE suport      Exercises   Exercises  Other Exercises    Other Exercises   At countertop with single RUE support: 2 x 10 reps marching with focus on SLS, 6 reps down and back side stepping - cues for increased step length              PT Education - 03/17/19 0851    Education Details  sit to stand addition to HEP    Person(s) Educated  Patient    Methods  Explanation;Demonstration;Handout    Comprehension  Verbalized understanding;Returned demonstration;Need further instruction       PT Short Term Goals - 03/15/19 0731      PT SHORT TERM GOAL #1   Title  Patient will be independent with initial HEP for balance and LE strenghtening in order to build upon functional gains in therapy. ALL STGS DUE BY 6TH VISIT.    Baseline  needs assistance getting into position for bridging, needs max cueing for quad sets.    Time  6    Period  --   6th visit   Status  Not Met      PT SHORT TERM GOAL #2   Title  Patient will decrease TUG score using single point cane to at least 53 seconds in order to decrease risk of falls.    Baseline  29.88 seconds on 03/15/19 with SPC and not using AFO    Status  Achieved      PT SHORT TERM GOAL #3   Title  Patient will ambulate 87' with supervision with single point cane in order to improve mobility for household distances.    Baseline  supervision/min guard for 20' with use of SPC, no AFO    Status  Partially Met      PT SHORT TERM GOAL #4   Title  Patient will perform w/c mobility with power w/c outside over paved/cement surfaces for 250' with mod I in order to improve community mobility and safety.    Baseline  pt not able to bring in power w/c to session due to pt driving to and from appointments    Status  Not Met      PT SHORT TERM GOAL #5   Title  Patient will tolerate static standing balance x3 minutes at countertop with supervision in order to increase endurance for ADLs.    Baseline  met on 03/15/19 with single RUE support    Status  Achieved        PT Long Term Goals - 03/15/19 1006      PT LONG TERM GOAL #1   Title  Patient will be independent with initial HEP for balance and LE strengthening in order to build upon functional gains in therapy. ALL LTGs DUE  04/05/19    Baseline  dependent with HEP    Time  6    Period  Weeks    Status  New      PT LONG TERM GOAL #2   Title  Patient will ambulate 16-80' with supervision with single point  cane and AFO in order to improve mobility for household distances.    Baseline  min guard/ min A with single point cane on 02/15/19    Status  New      PT LONG TERM GOAL #3   Title  Patient will tolerate static standing balance x5 minutes at countertop with supervision in order to increase endurance for ADLs.    Baseline  not yet assessed    Status  New      PT LONG TERM GOAL #4   Title  Patient will decrease TUG score using single point cane and AFO to at least 25 seconds in order to decrease risk of falls.    Baseline  29.88 seconds on 02/15/19    Status  Revised      PT LONG TERM GOAL #5   Title  Patient will perform at least 4 sit <> stands from arm chair with UE support during 30 second chair in order to demonstrate improved LE strength.    Baseline  2 sit <> stands during 30 seconds    Status  New      PT LONG TERM GOAL #6   Title  Patient will perform w/c mobility with power w/c outside over paved/cement surfaces and around obstacles for 500' with mod I in order to improve community mobility and safety.  Baseline  not yet assessed    Status  New      PT LONG TERM GOAL #7   Title  Patient will perform stand pivot t/f from power w/c <> mat table using single point cane and mod I in order to improve safety at home.    Baseline  not yet assessed    Status  New            Plan - 03/17/19 0854    Clinical Impression Statement  Today's skilled session focused on gait training with SPC and AFO, LE strengthening, and weight shifting pre-gait activities. Practiced sit to stands with feet apart and feet evenly spread apart for increased weight bearing through LLE - normally pt staggers RLE behind L. Pt able to demonstrate correctly at end of session. Cues needed throughout session for cane  sequencing with LLE. Will continue to progress towards LTGs.    Personal Factors and Comorbidities  Comorbidity 3+;Past/Current Experience;Time since onset of injury/illness/exacerbation    Comorbidities  HTN, HLD, prostate cancer, COPD, blind in L eye. OA and gout.    Examination-Activity Limitations  Locomotion Level;Stand;Transfers    Stability/Clinical Decision Making  Evolving/Moderate complexity    Rehab Potential  Fair    PT Frequency  2x / week    PT Duration  6 weeks    PT Treatment/Interventions  ADLs/Self Care Home Management;DME Instruction;Gait training;Functional mobility training;Neuromuscular re-education;Balance training;Therapeutic exercise;Therapeutic activities;Patient/family education;Wheelchair mobility training;Passive range of motion;Orthotic Fit/Training;Energy conservation    PT Next Visit Plan  Balance on compliant surfaces, stepping strategies. Contiue to work on Personnel officer wiht AFO (when he wears it). continue to work on activity tolerance and standing weight shifting, LE strengthening.    PT Home Exercise Plan  [email or web address]    Consulted and Agree with Plan of Care  Patient       Patient will benefit from skilled therapeutic intervention in order to improve the following deficits and impairments:  Abnormal gait, Decreased activity tolerance, Decreased balance, Decreased endurance, Decreased coordination, Decreased mobility, Decreased range of motion, Difficulty walking, Decreased strength, Impaired tone, Postural dysfunction  Visit Diagnosis: Unsteadiness on feet  Muscle weakness (generalized)  Difficulty in walking, not elsewhere classified  Other abnormalities of gait and mobility  Repeated falls  Other symptoms and signs involving the nervous system  Abnormal posture     Problem List Patient Active Problem List   Diagnosis Date Noted  . Malignant neoplasm of prostate ([place]) 08/19/2018    Arliss Journey, PT, DPT  03/17/2019, 9:08 AM  [facility] [address] [place], [place], [address] Phone: [phone]   Fax:  [phone]  Name: JUANMANUEL MAROHL MRN: [id number] Date of Birth: 09-Jun-1961

## 2019-03-22 ENCOUNTER — Ambulatory Visit: Payer: Medicaid Other | Admitting: Physical Therapy

## 2019-03-22 ENCOUNTER — Ambulatory Visit: Payer: Medicaid Other | Admitting: Occupational Therapy

## 2019-03-22 ENCOUNTER — Other Ambulatory Visit: Payer: Self-pay

## 2019-03-22 ENCOUNTER — Encounter: Payer: Self-pay | Admitting: Physical Therapy

## 2019-03-22 ENCOUNTER — Encounter: Payer: Self-pay | Admitting: Occupational Therapy

## 2019-03-22 DIAGNOSIS — R293 Abnormal posture: Secondary | ICD-10-CM

## 2019-03-22 DIAGNOSIS — M25612 Stiffness of left shoulder, not elsewhere classified: Secondary | ICD-10-CM

## 2019-03-22 DIAGNOSIS — R2689 Other abnormalities of gait and mobility: Secondary | ICD-10-CM

## 2019-03-22 DIAGNOSIS — M6281 Muscle weakness (generalized): Secondary | ICD-10-CM

## 2019-03-22 DIAGNOSIS — R262 Difficulty in walking, not elsewhere classified: Secondary | ICD-10-CM

## 2019-03-22 DIAGNOSIS — R296 Repeated falls: Secondary | ICD-10-CM

## 2019-03-22 DIAGNOSIS — R29818 Other symptoms and signs involving the nervous system: Secondary | ICD-10-CM

## 2019-03-22 DIAGNOSIS — M25642 Stiffness of left hand, not elsewhere classified: Secondary | ICD-10-CM

## 2019-03-22 DIAGNOSIS — R2681 Unsteadiness on feet: Secondary | ICD-10-CM

## 2019-03-22 DIAGNOSIS — R29898 Other symptoms and signs involving the musculoskeletal system: Secondary | ICD-10-CM

## 2019-03-22 NOTE — Therapy (Signed)
[facility] [address] [place], [place], [address] Phone: [phone]   Fax:  [phone]  Occupational Therapy Treatment  Patient Details  Name: [name] MRN: [id number] Date of Birth: [birth year] Referring Provider (OT): Dr, Jeanie Cooks   Encounter Date: 03/22/2019  OT End of Session - 03/22/19 0907    Visit Number  5    Number of Visits  6   eval plus 5 visits   Date for OT Re-Evaluation  04/05/19    Authorization Type  Medicaid 3 visits approved through 03/21/2019. Additional 2 visits approved by 04/04/2019    Authorization - Visit Number  5    Authorization - Number of Visits  6    OT Start Time  0802    OT Stop Time  [id number]    OT Time Calculation (min)  42 min    Activity Tolerance  Patient tolerated treatment well       Past Medical History:  Diagnosis Date  . Arthritis   . 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 COPD (chronic obstructive pulmonary disease) ([place])   . GERD (gastroesophageal reflux disease)   . 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 History of gout   . History of kidney stones   . Hyperlipidemia   . Hypertension   . Prostate cancer [facility]) urologist-- dr Darene Lamer. Tresa Moore;  oncologist-  dr [name]   dx 07-20-2017  . Renal calculi   . Stroke ([place]) sept 26,  20006    hx mini strokes in past, 1 large stroke  . Weakness of left side of body 03/2005   cva residual  . Weakness of one side of body    L sided weakness after stroke    Past Surgical History:  Procedure Laterality Date  . COLONOSCOPY WITH PROPOFOL N/A 09/27/2015   Procedure: COLONOSCOPY WITH PROPOFOL;  Surgeon: [name], MD;  [facility];  Service: Endoscopy;  Laterality: N/A;  . CYSTOSCOPY N/A 10/05/2012   Procedure: CYSTOSCOPY, retrograde and right stent placement;  Surgeon: [name], MD;  [facility];  Service: Urology;  Laterality: N/A;  . CYSTOSCOPY WITH RETROGRADE PYELOGRAM, URETEROSCOPY AND STENT PLACEMENT Right 04/13/2014   Procedure: CYSTOSCOPY WITH RETROGRADE PYELOGRAM, URETEROSCOPY AND STENT PLACEMENT, LASER ABLATION OF SCAR TISSUE;  Surgeon: [name], MD;  [facility];  Service: Urology;  Laterality: Right;  . EYE SURGERY Left age 37  . HOLMIUM LASER APPLICATION Right [id number]   Procedure: HOLMIUM LASER APPLICATION;  Surgeon: [name], MD;  [facility];  Service: Urology;  Laterality: Right;  . NEPHROLITHOTOMY Right 10/05/2012   Procedure: NEPHROLITHOTOMY PERCUTANEOUS;  Surgeon: [name], MD;  [facility];  Service: Urology;  Laterality: Right;  WITH CYSTO AND SURGEON ACCESS   . NEPHROLITHOTOMY Right 10/07/2012   Procedure: NEPHROLITHOTOMY PERCUTANEOUS SECOND LOOK. Stent exchange,Ureteroscopy ;  Surgeon: [name], MD;  [facility];  Service: Urology;  Laterality: Right;  . RADIOACTIVE SEED IMPLANT N/A 11/12/2018   Procedure: RADIOACTIVE SEED IMPLANT/BRACHYTHERAPY IMPLANT;  Surgeon: [name], MD;  [facility];  Service: Urology;  Laterality: N/A;  90 MINS  . SPACE OAR INSTILLATION N/A 11/12/2018   Procedure: SPACE OAR INSTILLATION;  Surgeon: [name], MD;  [facility];  Service: Urology;  Laterality: N/A;  . TOENAIL EXCISION  There were no vitals filed for this visit.  Subjective Assessment - 03/22/19 0804    Subjective   I have achiness due to the cold weather from athritis.  I wouldn't say it's true pain just achiness in my knees and shoulders.    Pertinent History  R CVA in 03/26/2005.  [id number], HLD, HTN, blind in L eye, prostate cancer, OA and gout    Patient Stated Goals  I want to get stronger    Currently in Pain?  No/denies                   OT Treatments/Exercises (OP) - 03/22/19 0001      ADLs   ADL Comments  Pt reports he contacted physiatry for appt for  spasticity mgmt early last week and was told they would call with appt and that they had referral from primary MD. To date pt has not heard anything. Sent follow up message to MD and scheduler and asked pt to call again tomorrow.  Pt verbalized understanding and stated he would.       Neurological Re-education Exercises   Other Exercises 2  Pt reports daily compliance with HEP.  Pt currently able to tolerate 140* of shoulder flexion and 100* of abduction in supine without pain.  Pt able to participate in [facility] for shoulder flexion/extension, ab/adduction and limited elbow flexion/extension. Pt also with limited ability to partial open and close hand.  Pt reports that he feels the arm is "less stiff and doesn't draw up as much when I do my exercises."      Splinting   Splinting  Pt reports that he wore splint per schedule without dificulty and slept in splint last night. Upon examination, pt has small open area on volar side of pinky at PIP joint. Pt reports this was not there yesterday and that he "aggressively cleaned" his L hand yesterday. Pt feels he opened the skin when he cleaned his hand. Given that pt was wearing splint for 8 hours on Friday and Saturday without issues, and given that pinky is relaxed in splint feel it is unlikely that splint caused skin issue. Cleaned hand will with soap and water and dried well. Small area left open to the air. Will hold splint here in clinic until healed in the unlikely even that the splint aggravated his skin.  Instructed pt on vigiliant monitoring and pt knows to call MD should area look worse vs better. Demonstrated to pt how to "dry" hand with rolled up washcloth without rubbing skin - pt able to return demonstrate. Pt knows to keep area clean, dry and open the air.  Will have PT examine when pt comes for PT on Wednesday.  Pt verbalized understanding of all information.       Manual Therapy   Manual Therapy  Joint mobilization;Soft tissue mobilization;Scapular  mobilization    Manual therapy comments  joint, soft tisue and scap mob to LUE prior to neuro re ed  for activation of LUE. Tolerated well and pt's LUE with decreased tightness and spasticity.              OT Education - 03/22/19 0904    Education Details  care of skin on L hand, monitoring small open area,    Person(s) Educated  Patient    Methods  Explanation;Demonstration;Verbal cues    Comprehension  Verbalized understanding;Returned demonstration       OT Short Term Goals - 03/22/19 [id number]  OT SHORT TERM GOAL #1   Title  Pt will be mod I for basic HEP for LUE - 03/29/2019 (date adjusted to allow for Medicaid approval)    Baseline  dependent    Status  Achieved      OT SHORT TERM GOAL #2   Title  Pt will be mod I for splint wear and care    Baseline  dependent    Status  Achieved        OT Long Term Goals - 03/22/19 0905      OT LONG TERM GOAL #1   Title  Pt will be mod I with upgraded HEP for LUE - 04/05/2019 (date adjusted to allow for Medicaid approval)    Baseline  dependent    Status  On-going      OT LONG TERM GOAL #2   Title  Pt will demonstate understanding of spasticity mgmt techniques for LUE    Baseline  dependent    Status  On-going      OT LONG TERM GOAL #4   Title  Pt will demonstrate ability for shoulder flexion to 100* with 0/10 pain for HEP    Baseline  90* of shoulder flexion    Status  On-going            Plan - 03/22/19 0905    Clinical Impression Statement  Pt progressing toward goals. WIll monitor smal open area on L hand and reintroduce splint when appropriate.  See note for details.    OT Occupational Profile and History  Detailed Assessment- Review of Records and additional review of physical, cognitive, psychosocial history related to current functional performance    Occupational performance deficits (Please refer to evaluation for details):  ADL's;IADL's    Body Structure / Function / Physical Skills   ADL;Edema;Sensation;ROM    Rehab Potential  Good    Clinical Decision Making  Limited treatment options, no task modification necessary    Comorbidities Affecting Occupational Performance:  May have comorbidities impacting occupational performance    Modification or Assistance to Complete Evaluation   Min-Moderate modification of tasks or assist with assess necessary to complete eval    OT Frequency  1x / week    OT Duration  Other (comment)   eval plus 5 weeks   OT Treatment/Interventions  Self-care/ADL training;Therapeutic exercise;Neuromuscular education;Passive range of motion;Splinting;Therapeutic activities;Patient/family education    Plan  check skin on L hand, reintroduce splint if able; upgrade HEP as possible.  Next visit is last approved OT visist - may need to request additional visits if further splinting needs to be addresed.    Consulted and Agree with Plan of Care  Patient       Patient will benefit from skilled therapeutic intervention in order to improve the following deficits and impairments:   Body Structure / Function / Physical Skills: ADL, Edema, Sensation, ROM       Visit Diagnosis: Other symptoms and signs involving the nervous system  Other symptoms and signs involving the musculoskeletal system  Stiffness of left hand, not elsewhere classified  Stiffness of left shoulder, not elsewhere classified  Abnormal posture    Problem List Patient Active Problem List   Diagnosis Date Noted  . Malignant neoplasm of prostate ([place]) 08/19/2018    Quay Burow, OTR/L 03/22/2019, 9:10 AM  [facility] [address] [place], [place], [address] Phone: [phone]   Fax:  [phone]  Name: [name] MRN: [id number] Date  of Birth: 20-Dec-1960

## 2019-03-22 NOTE — Therapy (Signed)
[facility] [address] [place], [place], [address] Phone: [phone]   Fax:  [phone]  Physical Therapy Treatment  Patient Details  Name: [name] MRN: [id number] Date of Birth: 11-14-1960 Referring Provider (PT): Ezekiel Ina, MD   Encounter Date: 03/22/2019  PT End of Session - 03/22/19 0940    Visit Number  8    Number of Visits  13   eval plus 12 visits   Date for PT Re-Evaluation  04/05/19    Authorization Type  12 visits from 8/31 through 04/11/19    Authorization - Visit Number  4    Authorization - Number of Visits  12    PT Start Time  0718    PT Stop Time  0800    PT Time Calculation (min)  42 min    Equipment Utilized During Treatment  Gait belt    Activity Tolerance  Patient tolerated treatment well    Behavior During Therapy  [facility] for tasks assessed/performed       Past Medical History:  Diagnosis Date  . Arthritis   . 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 COPD (chronic obstructive pulmonary disease) ([place])   . GERD (gastroesophageal reflux disease)   . 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 History of gout   . History of kidney stones   . Hyperlipidemia   . Hypertension   . Prostate cancer [facility]) urologist-- dr Darene Lamer. Tresa Moore;  oncologist-  dr [name]   dx 07-20-2017  . Renal calculi   . Stroke ([place]) sept 26,  20006    hx mini strokes in past, 1 large stroke  . Weakness of left side of body 03/2005   cva residual  . Weakness of one side of body    L sided weakness after stroke    Past Surgical History:  Procedure Laterality Date  . COLONOSCOPY WITH PROPOFOL N/A 09/27/2015   Procedure: COLONOSCOPY WITH PROPOFOL;  Surgeon: [name], MD;  [facility]: [facility];  Service: Endoscopy;  Laterality: N/A;  . CYSTOSCOPY N/A 10/05/2012   Procedure:  CYSTOSCOPY, retrograde and right stent placement;  Surgeon: [name], MD;  [facility]: [facility];  Service: Urology;  Laterality: N/A;  . CYSTOSCOPY WITH RETROGRADE PYELOGRAM, URETEROSCOPY AND STENT PLACEMENT Right 04/13/2014   Procedure: CYSTOSCOPY WITH RETROGRADE PYELOGRAM, URETEROSCOPY AND STENT PLACEMENT, LASER ABLATION OF SCAR TISSUE;  Surgeon: [name], MD;  [facility];  Service: Urology;  Laterality: Right;  . EYE SURGERY Left age 66  . HOLMIUM LASER APPLICATION Right 00/76/2263   Procedure: HOLMIUM LASER APPLICATION;  Surgeon: [name], MD;  [facility];  Service: Urology;  Laterality: Right;  . NEPHROLITHOTOMY Right 10/05/2012   Procedure: NEPHROLITHOTOMY PERCUTANEOUS;  Surgeon: [name], MD;  [facility];  Service: Urology;  Laterality: Right;  WITH CYSTO AND SURGEON ACCESS   . NEPHROLITHOTOMY Right 10/07/2012   Procedure: NEPHROLITHOTOMY PERCUTANEOUS SECOND LOOK. Stent exchange,Ureteroscopy ;  Surgeon: [name], MD;  [facility];  Service: Urology;  Laterality: Right;  . RADIOACTIVE SEED IMPLANT N/A 11/12/2018   Procedure: RADIOACTIVE SEED IMPLANT/BRACHYTHERAPY IMPLANT;  Surgeon: [name], MD;  [facility];  Service: Urology;  Laterality: N/A;  90 MINS  . SPACE OAR INSTILLATION N/A 11/12/2018   Procedure: SPACE OAR INSTILLATION;  Surgeon: [name], MD;  [facility];  Service: Urology;  Laterality: N/A;  . TOENAIL EXCISION      There were no vitals filed for this visit.  Subjective Assessment - 03/22/19 0721    Subjective  Knees, hips, and shoulders are aching today. States that when the weather gets colder, his joints start to hurt. No falls.    Pertinent History  R CVA (03/26/2005); blind in L eye and low vision in R eye; HLD, HTN, COPD, prostate cancer    Limitations  Walking;Standing    How long can you walk comfortably?  to his mailbox (about 50 ft)    Patient Stated Goals  wants to get his L leg working again     Currently in Pain?  Yes    Pain Score  5    hips, knees, shoulders - from arthritis   Pain Descriptors / Indicators  --   arthritis pain                      [facility] Adult PT Treatment/Exercise - 03/22/19 0741      Transfers   Transfers  Stand to Sit;Sit to Stand    Sit to Stand  5: Supervision    Sit to Stand Details  Verbal cues for sequencing;Verbal cues for technique;Visual cues/gestures for sequencing    Sit to Stand Details (indicate cue type and reason)  throughout session - cues for feet apart vs. RLE staggered behind LLE in order to increase weight bearing through LLE    Comments  In // bars first 2 sets performed with single UE support, last set with no UE support: 3 x 5 reps mini squats - cues for technique and weight shifting to LLE as well as quad and glute activation in standing. Needed brief seated rest break after 2 sets       Ambulation/Gait   Ambulation/Gait  Yes    Ambulation/Gait Assistance  5: Supervision;4: Min guard    Ambulation/Gait Assistance Details  With L AFO donned - cues for increased step length with RLE and upright posture    Ambulation Distance (Feet)  115 Feet    Assistive device  Straight cane    Gait Pattern  Decreased stance time - left;Decreased step length - left;Decreased arm swing - left;Decreased stride length;Decreased weight shift to left;Decreased dorsiflexion - left;Decreased hip/knee flexion - left;Trunk flexed;Poor foot clearance - left;Wide base of support;Left circumduction;Step-through pattern    Ambulation Surface  Level;Indoor      High Level Balance   High Level Balance Comments  In // bars with no UE support on blue foam: feet together eyes closed 3 x 30 seconds, feet apart eyes closed vertical head nods 1 x 10 reps, horizontal head turns 1 x 10 reps. pt needing cues for "soft knee" on RLE (tends to be held in genu recurvatum) - verbal and tactile cues for quad activation on LLE      Knee/Hip Exercises: Stretches    Active Hamstring Stretch  2 reps;30 seconds    Active Hamstring Stretch Limitations  Taught pt seated hamstring stretch to perform at home on L due to severe hypomobility    Passive Hamstring Stretch  Left;3 reps;30 seconds    Other Knee/Hip Stretches  Passive stretching for hip ABD on LLE in supine semi-reclined with wedge: 3 x 30 seconds      Knee/Hip Exercises: Aerobic   Other Aerobic  SciFit with LEs/right UE level 2.0 for 5 minutes with goal >/=40 rpm for strengthening,  ROM, and activity tolerance.             PT Education - 03/22/19 0940    Education Details  seated hamstring stretch for home    Person(s) Educated  Patient    Methods  Explanation;Demonstration    Comprehension  Verbalized understanding;Returned demonstration       PT Short Term Goals - 03/15/19 0731      PT SHORT TERM GOAL #1   Title  Patient will be independent with initial HEP for balance and LE strenghtening in order to build upon functional gains in therapy. ALL STGS DUE BY 6TH VISIT.    Baseline  needs assistance getting into position for bridging, needs max cueing for quad sets.    Time  6    Period  --   6th visit   Status  Not Met      PT SHORT TERM GOAL #2   Title  Patient will decrease TUG score using single point cane to at least 53 seconds in order to decrease risk of falls.    Baseline  29.88 seconds on 03/15/19 with SPC and not using AFO    Status  Achieved      PT SHORT TERM GOAL #3   Title  Patient will ambulate 15' with supervision with single point cane in order to improve mobility for household distances.    Baseline  supervision/min guard for 20' with use of SPC, no AFO    Status  Partially Met      PT SHORT TERM GOAL #4   Title  Patient will perform w/c mobility with power w/c outside over paved/cement surfaces for 250' with mod I in order to improve community mobility and safety.    Baseline  pt not able to bring in power w/c to session due to pt driving to and from  appointments    Status  Not Met      PT SHORT TERM GOAL #5   Title  Patient will tolerate static standing balance x3 minutes at countertop with supervision in order to increase endurance for ADLs.    Baseline  met on 03/15/19 with single RUE support    Status  Achieved        PT Long Term Goals - 03/15/19 1006      PT LONG TERM GOAL #1   Title  Patient will be independent with initial HEP for balance and LE strengthening in order to build upon functional gains in therapy. ALL LTGs DUE 04/05/19    Baseline  dependent with HEP    Time  6    Period  Weeks    Status  New      PT LONG TERM GOAL #2   Title  Patient will ambulate 53-80' with supervision with single point  cane and AFO in order to improve mobility for household distances.    Baseline  min guard/ min A with single point cane on 02/15/19    Status  New      PT LONG TERM GOAL #3   Title  Patient will tolerate static standing balance x5 minutes at countertop with supervision in order to increase endurance for ADLs.    Baseline  not yet assessed    Status  New      PT LONG TERM GOAL #4   Title  Patient will decrease TUG score using single point cane and AFO to at least 25 seconds in order to decrease risk of falls.  Baseline  29.88 seconds on 02/15/19    Status  Revised      PT LONG TERM GOAL #5   Title  Patient will perform at least 4 sit <> stands from arm chair with UE support during 30 second chair in order to demonstrate improved LE strength.    Baseline  2 sit <> stands during 30 seconds    Status  New      PT LONG TERM GOAL #6   Title  Patient will perform w/c mobility with power w/c outside over paved/cement surfaces and around obstacles for 500' with mod I in order to improve community mobility and safety.    Baseline  not yet assessed    Status  New      PT LONG TERM GOAL #7   Title  Patient will perform stand pivot t/f from power w/c <> mat table using single point cane and mod I in order to improve safety  at home.    Baseline  not yet assessed    Status  New            Plan - 03/22/19 0941    Clinical Impression Statement  Pt tolerated treatment well today - focus on gait training, LE strengthening, ROM, and balance. At end of session, pt reported a decrease in arthritic pain in his hips and his knees. With static standing balance on foam pt demonstrated increased genu recurvatum on R and increased knee flexion on L - pt able to correct with cues for "soft knee" on right, however difficulty with quad activation on LLE. With increased repetitions, pt able to perform balance with eyes closed on foam for 30 seconds with no LOB. Will continue to progress towards LTGs.    Personal Factors and Comorbidities  Comorbidity 3+;Past/Current Experience;Time since onset of injury/illness/exacerbation    Comorbidities  HTN, HLD, prostate cancer, COPD, blind in L eye. OA and gout.    Examination-Activity Limitations  Locomotion Level;Stand;Transfers    Stability/Clinical Decision Making  Evolving/Moderate complexity    Rehab Potential  Fair    PT Frequency  2x / week    PT Duration  6 weeks    PT Treatment/Interventions  ADLs/Self Care Home Management;DME Instruction;Gait training;Functional mobility training;Neuromuscular re-education;Balance training;Therapeutic exercise;Therapeutic activities;Patient/family education;Wheelchair mobility training;Passive range of motion;Orthotic Fit/Training;Energy conservation    PT Next Visit Plan  Balance on compliant surfaces, stepping strategies. Contiue to work on Personnel officer with AFO (when he wears it). continue to work on activity tolerance and standing weight shifting, LE strengthening.    PT Home Exercise Plan  [id number]    Consulted and Agree with Plan of Care  Patient       Patient will benefit from skilled therapeutic intervention in order to improve the following deficits and impairments:  Abnormal gait, Decreased activity tolerance, Decreased balance,  Decreased endurance, Decreased coordination, Decreased mobility, Decreased range of motion, Difficulty walking, Decreased strength, Impaired tone, Postural dysfunction  Visit Diagnosis: Other symptoms and signs involving the nervous system  Unsteadiness on feet  Abnormal posture  Muscle weakness (generalized)  Difficulty in walking, not elsewhere classified  Other abnormalities of gait and mobility  Repeated falls     Problem List Patient Active Problem List   Diagnosis Date Noted  . Malignant neoplasm of prostate ([place]) 08/19/2018    Arliss Journey, PT, DPT 03/22/2019, 9:45 AM  [facility] [address] [place], [place], [address] Phone: [phone]   Fax:  [phone]  Name: [name] MRN: [id number] Date of Birth: 13-Apr-1961

## 2019-03-24 ENCOUNTER — Encounter: Payer: Self-pay | Admitting: Physical Therapy

## 2019-03-24 ENCOUNTER — Ambulatory Visit: Payer: Medicaid Other | Admitting: Physical Therapy

## 2019-03-24 ENCOUNTER — Other Ambulatory Visit: Payer: Self-pay

## 2019-03-24 DIAGNOSIS — R2681 Unsteadiness on feet: Secondary | ICD-10-CM

## 2019-03-24 DIAGNOSIS — R29818 Other symptoms and signs involving the nervous system: Secondary | ICD-10-CM

## 2019-03-24 DIAGNOSIS — R2689 Other abnormalities of gait and mobility: Secondary | ICD-10-CM

## 2019-03-24 DIAGNOSIS — R262 Difficulty in walking, not elsewhere classified: Secondary | ICD-10-CM

## 2019-03-24 DIAGNOSIS — R296 Repeated falls: Secondary | ICD-10-CM

## 2019-03-24 DIAGNOSIS — M6281 Muscle weakness (generalized): Secondary | ICD-10-CM

## 2019-03-24 DIAGNOSIS — R293 Abnormal posture: Secondary | ICD-10-CM | POA: Diagnosis not present

## 2019-03-24 NOTE — Therapy (Signed)
[facility] [address] [place], [place], [address] Phone: [phone]   Fax:  [phone]  Physical Therapy Treatment  Patient Details  Name: [name] MRN: [id number] Date of Birth: [birth year] Referring Provider (PT): Ezekiel Ina, MD   Encounter Date: 03/24/2019  PT End of Session - 03/24/19 1042    Visit Number  9    Number of Visits  13   eval plus 12 visits   Date for PT Re-Evaluation  04/05/19    Authorization Type  12 visits from 8/31 through 04/11/19    Authorization - Visit Number  4    Authorization - Number of Visits  12    PT Start Time  0800    PT Stop Time  0845    PT Time Calculation (min)  45 min    Equipment Utilized During Treatment  Gait belt    Activity Tolerance  Patient tolerated treatment well    Behavior During Therapy  [facility] for tasks assessed/performed       Past Medical History:  Diagnosis Date  . Arthritis   . 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 COPD (chronic obstructive pulmonary disease) ([place])   . GERD (gastroesophageal reflux disease)   . 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 History of gout   . History of kidney stones   . Hyperlipidemia   . Hypertension   . Prostate cancer [facility]) urologist-- dr Darene Lamer. Tresa Moore;  oncologist-  dr [name]   dx 07-20-2017  . Renal calculi   . Stroke ([place]) sept 26,  20006    hx mini strokes in past, 1 large stroke  . Weakness of left side of body 03/2005   cva residual  . Weakness of one side of body    L sided weakness after stroke    Past Surgical History:  Procedure Laterality Date  . COLONOSCOPY WITH PROPOFOL N/A 09/27/2015   Procedure: COLONOSCOPY WITH PROPOFOL;  Surgeon: [name], MD;  [facility];  Service: Endoscopy;  Laterality: N/A;  . CYSTOSCOPY N/A 10/05/2012   Procedure:  CYSTOSCOPY, retrograde and right stent placement;  Surgeon: [name], MD;  [facility];  Service: Urology;  Laterality: N/A;  . CYSTOSCOPY WITH RETROGRADE PYELOGRAM, URETEROSCOPY AND STENT PLACEMENT Right 04/13/2014   Procedure: CYSTOSCOPY WITH RETROGRADE PYELOGRAM, URETEROSCOPY AND STENT PLACEMENT, LASER ABLATION OF SCAR TISSUE;  Surgeon: [name], MD;  [facility];  Service: Urology;  Laterality: Right;  . EYE SURGERY Left age 65  . HOLMIUM LASER APPLICATION Right 74/16/3845   Procedure: HOLMIUM LASER APPLICATION;  Surgeon: [name], MD;  [facility];  Service: Urology;  Laterality: Right;  . NEPHROLITHOTOMY Right 10/05/2012   Procedure: NEPHROLITHOTOMY PERCUTANEOUS;  Surgeon: [name], MD;  [facility];  Service: Urology;  Laterality: Right;  WITH CYSTO AND SURGEON ACCESS   . NEPHROLITHOTOMY Right 10/07/2012   Procedure: NEPHROLITHOTOMY PERCUTANEOUS SECOND LOOK. Stent exchange,Ureteroscopy ;  Surgeon: [name], MD;  [facility];  Service: Urology;  Laterality: Right;  . RADIOACTIVE SEED IMPLANT N/A 11/12/2018   Procedure: RADIOACTIVE SEED IMPLANT/BRACHYTHERAPY IMPLANT;  Surgeon: [name], MD;  [facility];  Service: Urology;  Laterality: N/A;  90 MINS  . SPACE OAR INSTILLATION N/A 11/12/2018   Procedure: SPACE OAR INSTILLATION;  Surgeon: [name], MD;  [facility];  Service: Urology;  Laterality: N/A;  . TOENAIL EXCISION      There were no vitals filed for this visit.  Subjective Assessment - 03/24/19 0804    Subjective  Feeling better this morning and better after last session. No falls.    Pertinent History  R CVA (03/26/2005); blind in L eye and low vision in R eye; HLD, HTN, COPD, prostate cancer    Limitations  Walking;Standing    How long can you walk comfortably?  to his mailbox (about 50 ft)    Patient Stated Goals  wants to get his L leg working again    Currently in Pain?  No/denies                        [facility] Adult PT Treatment/Exercise - 03/24/19 0001      Transfers   Transfers  Stand to Sit;Sit to Stand    Sit to Stand  5: Supervision    Sit to Stand Details  Verbal cues for sequencing;Verbal cues for technique;Visual cues/gestures for sequencing    Comments  Sit <> stands multiple reps with 2" block under RLE to increase weight bearing through LLE from blue mat table . Focus on eccentric control - initial reps with use of RUE to help lower to mat and progressing to no UE use (takes increased time). Mirror in front of pt for visual feedback in standing after sit > stand for midline orientation in standing and weight shifting through L.        Ambulation/Gait   Ambulation/Gait  Yes    Ambulation/Gait Assistance  5: Supervision    Ambulation/Gait Assistance Details  With L AFO donned, noted improvement with R step length after pre-gait weight shifting activities to LLE.    Ambulation Distance (Feet)  115 Feet    Assistive device  Straight cane   with L AFO   Gait Pattern  Decreased stance time - left;Decreased step length - left;Decreased arm swing - left;Decreased stride length;Decreased weight shift to left;Decreased dorsiflexion - left;Decreased hip/knee flexion - left;Trunk flexed;Poor foot clearance - left;Wide base of support;Left circumduction;Step-through pattern    Ambulation Surface  Level;Indoor      Neuro Re-ed    Neuro Re-ed Details   Massed practice weight shifts to LLE and stepping forward with RLE to purple disc on floor in order to improve step length on R and stance time on L. Weight shifts to LLE and 2" step taps with RLE progressing to holding RLE on top of step and no UE support for balance - holds for approx. 15-20 seconds. SLS stance taps on foam bubbles with weight shifting towards LLE, holding on to chair for support with RUE forward taps and cross taps multiple reps, progressing to no UE support and single forward tap with RLE - cues  for weight shifting towards LLE and standing tall - 10 reps.  At countertop with red mat for compliant surface: down and back 4 reps with min guard                PT Short Term Goals - 03/15/19 0731      PT SHORT TERM GOAL #1   Title  Patient will be independent with initial HEP for balance and LE strenghtening in order to build upon functional gains in therapy. ALL STGS DUE BY 6TH VISIT.    Baseline  needs assistance getting into position for bridging, needs max cueing for quad  sets.    Time  6    Period  --   6th visit   Status  Not Met      PT SHORT TERM GOAL #2   Title  Patient will decrease TUG score using single point cane to at least 53 seconds in order to decrease risk of falls.    Baseline  29.88 seconds on 03/15/19 with SPC and not using AFO    Status  Achieved      PT SHORT TERM GOAL #3   Title  Patient will ambulate 22' with supervision with single point cane in order to improve mobility for household distances.    Baseline  supervision/min guard for 20' with use of SPC, no AFO    Status  Partially Met      PT SHORT TERM GOAL #4   Title  Patient will perform w/c mobility with power w/c outside over paved/cement surfaces for 250' with mod I in order to improve community mobility and safety.    Baseline  pt not able to bring in power w/c to session due to pt driving to and from appointments    Status  Not Met      PT SHORT TERM GOAL #5   Title  Patient will tolerate static standing balance x3 minutes at countertop with supervision in order to increase endurance for ADLs.    Baseline  met on 03/15/19 with single RUE support    Status  Achieved        PT Long Term Goals - 03/15/19 1006      PT LONG TERM GOAL #1   Title  Patient will be independent with initial HEP for balance and LE strengthening in order to build upon functional gains in therapy. ALL LTGs DUE 04/05/19    Baseline  dependent with HEP    Time  6    Period  Weeks    Status  New      PT LONG  TERM GOAL #2   Title  Patient will ambulate 12-80' with supervision with single point  cane and AFO in order to improve mobility for household distances.    Baseline  min guard/ min A with single point cane on 02/15/19    Status  New      PT LONG TERM GOAL #3   Title  Patient will tolerate static standing balance x5 minutes at countertop with supervision in order to increase endurance for ADLs.    Baseline  not yet assessed    Status  New      PT LONG TERM GOAL #4   Title  Patient will decrease TUG score using single point cane and AFO to at least 25 seconds in order to decrease risk of falls.    Baseline  29.88 seconds on 02/15/19    Status  Revised      PT LONG TERM GOAL #5   Title  Patient will perform at least 4 sit <> stands from arm chair with UE support during 30 second chair in order to demonstrate improved LE strength.    Baseline  2 sit <> stands during 30 seconds    Status  New      PT LONG TERM GOAL #6   Title  Patient will perform w/c mobility with power w/c outside over paved/cement surfaces and around obstacles for 500' with mod I in order to improve community mobility and safety.    Baseline  not yet assessed    Status  New      PT LONG TERM GOAL #7   Title  Patient will perform stand pivot t/f from power w/c <> mat table using single point cane and mod I in order to improve safety at home.    Baseline  not yet assessed    Status  New            Plan - 03/24/19 1054    Clinical Impression Statement  Pt with good participation throughout today's session and highly motivated. Pt able to demonstrate improved R step length today after pre-gait activities with focus on LLE weight shifting. Without block placed under RLE for increased weight shift to L, pt with tendency to stagger RLE behind to perform sit <> stands. Pt able to demonstrate improved eccentric control today without use of UE to sit back down to mat table. Will continue to progress towards LTGs.     Personal Factors and Comorbidities  Comorbidity 3+;Past/Current Experience;Time since onset of injury/illness/exacerbation    Comorbidities  HTN, HLD, prostate cancer, COPD, blind in L eye. OA and gout.    Examination-Activity Limitations  Locomotion Level;Stand;Transfers    Stability/Clinical Decision Making  Evolving/Moderate complexity    Rehab Potential  Fair    PT Frequency  2x / week    PT Duration  6 weeks    PT Treatment/Interventions  ADLs/Self Care Home Management;DME Instruction;Gait training;Functional mobility training;Neuromuscular re-education;Balance training;Therapeutic exercise;Therapeutic activities;Patient/family education;Wheelchair mobility training;Passive range of motion;Orthotic Fit/Training;Energy conservation    PT Next Visit Plan  Sit <> stands with focus on LLE weight shifting and no UE when sitting. Gait training outdoors / around chairs/obstacles in gym. Balance strategies on compliant surfaces. Activity tolerance in standing and weight shifting.    PT Home Exercise Plan  [email or web address]    Consulted and Agree with Plan of Care  Patient       Patient will benefit from skilled therapeutic intervention in order to improve the following deficits and impairments:  Abnormal gait, Decreased activity tolerance, Decreased balance, Decreased endurance, Decreased coordination, Decreased mobility, Decreased range of motion, Difficulty walking, Decreased strength, Impaired tone, Postural dysfunction  Visit Diagnosis: Other symptoms and signs involving the nervous system  Unsteadiness on feet  Abnormal posture  Muscle weakness (generalized)  Difficulty in walking, not elsewhere classified  Other abnormalities of gait and mobility  Repeated falls     Problem List Patient Active Problem List   Diagnosis Date Noted  . Malignant neoplasm of prostate ([place]) 08/19/2018    Arliss Journey, PT, DPT 03/24/2019, 10:57 AM  [facility] [address] [place], [place], [address] Phone: [phone]   Fax:  [phone]  Name: KELDRICK POMPLUN MRN: [id number] Date of Birth: 11-29-1960

## 2019-03-29 ENCOUNTER — Encounter: Payer: Self-pay | Admitting: Occupational Therapy

## 2019-03-29 ENCOUNTER — Other Ambulatory Visit: Payer: Self-pay

## 2019-03-29 ENCOUNTER — Ambulatory Visit: Payer: Medicaid Other | Admitting: Occupational Therapy

## 2019-03-29 DIAGNOSIS — M25612 Stiffness of left shoulder, not elsewhere classified: Secondary | ICD-10-CM

## 2019-03-29 DIAGNOSIS — R293 Abnormal posture: Secondary | ICD-10-CM | POA: Diagnosis not present

## 2019-03-29 DIAGNOSIS — R29818 Other symptoms and signs involving the nervous system: Secondary | ICD-10-CM

## 2019-03-29 DIAGNOSIS — R29898 Other symptoms and signs involving the musculoskeletal system: Secondary | ICD-10-CM

## 2019-03-29 DIAGNOSIS — M25642 Stiffness of left hand, not elsewhere classified: Secondary | ICD-10-CM

## 2019-03-29 NOTE — Therapy (Signed)
[facility] [address] [place], [place], [address] Phone: [phone]   Fax:  [phone]  Occupational Therapy Treatment  Patient Details  Name: [name] MRN: [id number] Date of Birth: August 03, 1960 Referring Provider (OT): Dr, Jeanie Cooks   Encounter Date: 03/29/2019  OT End of Session - 03/29/19 0853    Visit Number  6    Number of Visits  6    Date for OT Re-Evaluation  04/05/19    Authorization Type  Medicaid 3 visits approved through 03/21/2019. Additional 2 visits approved by 04/04/2019    Authorization - Visit Number  6    Authorization - Number of Visits  6    OT Start Time  0801    OT Stop Time  0843    OT Time Calculation (min)  42 min    Activity Tolerance  Patient tolerated treatment well       Past Medical History:  Diagnosis Date  . Arthritis   . 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 COPD (chronic obstructive pulmonary disease) ([place])   . GERD (gastroesophageal reflux disease)   . 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 History of gout   . History of kidney stones   . Hyperlipidemia   . Hypertension   . Prostate cancer [facility]) urologist-- dr Darene Lamer. Tresa Moore;  oncologist-  dr [name]   dx 07-20-2017  . Renal calculi   . Stroke ([place]) sept 26,  20006    hx mini strokes in past, 1 large stroke  . Weakness of left side of body 03/2005   cva residual  . Weakness of one side of body    L sided weakness after stroke    Past Surgical History:  Procedure Laterality Date  . COLONOSCOPY WITH PROPOFOL N/A 09/27/2015   Procedure: COLONOSCOPY WITH PROPOFOL;  Surgeon: [name], MD;  [facility];  Service: Endoscopy;  Laterality: N/A;  . CYSTOSCOPY N/A 10/05/2012   Procedure: CYSTOSCOPY, retrograde and right stent placement;  Surgeon: [name], MD;  [facility];  Service: Urology;  Laterality: N/A;  . CYSTOSCOPY WITH RETROGRADE PYELOGRAM, URETEROSCOPY AND STENT PLACEMENT Right 04/13/2014   Procedure: CYSTOSCOPY WITH RETROGRADE PYELOGRAM, URETEROSCOPY AND STENT PLACEMENT, LASER ABLATION OF SCAR TISSUE;  Surgeon: [name], MD;  [facility];  Service: Urology;  Laterality: Right;  . EYE SURGERY Left age 64  . HOLMIUM LASER APPLICATION Right 46/28/6381   Procedure: HOLMIUM LASER APPLICATION;  Surgeon: [name], MD;  [facility];  Service: Urology;  Laterality: Right;  . NEPHROLITHOTOMY Right 10/05/2012   Procedure: NEPHROLITHOTOMY PERCUTANEOUS;  Surgeon: [name], MD;  [facility];  Service: Urology;  Laterality: Right;  WITH CYSTO AND SURGEON ACCESS   . NEPHROLITHOTOMY Right 10/07/2012   Procedure: NEPHROLITHOTOMY PERCUTANEOUS SECOND LOOK. Stent exchange,Ureteroscopy ;  Surgeon: [name], MD;  [facility];  Service: Urology;  Laterality: Right;  . RADIOACTIVE SEED IMPLANT N/A 11/12/2018   Procedure: RADIOACTIVE SEED IMPLANT/BRACHYTHERAPY IMPLANT;  Surgeon: [name], MD;  [facility];  Service: Urology;  Laterality: N/A;  90 MINS  . SPACE OAR INSTILLATION N/A 11/12/2018   Procedure: SPACE OAR INSTILLATION;  Surgeon: [name], MD;  [facility];  Service: Urology;  Laterality: N/A;  . TOENAIL EXCISION      There were no vitals  filed for this visit.  Subjective Assessment - 03/29/19 0803    Subjective   I feel kind of an achy stiffness all over from the rain and my arthritis - its not true pain.    Pertinent History  R CVA in 03/26/2005.  DUK:GURK, HLD, HTN, blind in L eye, prostate cancer, OA and gout    Patient Stated Goals  I want to get stronger    Currently in Pain?  No/denies   see subjective statement                  OT Treatments/Exercises (OP) - 03/29/19 0001      Neurological Re-education Exercises   Other Exercises 2  Upgraded HEP to include active elbow  flexion/extension - practiced in both supine and sitting and pt able to return demonrtrate after instruction and practice.  Also educated pt on gentle washing of L hand and soaking using warm water and few drops of vanila extract. Given in writing and pt verbalized understanding.        Splinting   Splinting  L pinky area completely healed and splint adjusted.  Pt reissued graduated wearing schedule and PT and pt to monitor skin closely.  Pt scheduled to see physiatry on Friday for spasticity mgmt.                 OT Short Term Goals - 03/29/19 0851      OT SHORT TERM GOAL #1   Title  Pt will be mod I for basic HEP for LUE - 03/29/2019 (date adjusted to allow for Medicaid approval)    Baseline  dependent    Status  Achieved      OT SHORT TERM GOAL #2   Title  Pt will be mod I for splint wear and care    Baseline  dependent    Status  Achieved        OT Long Term Goals - 03/29/19 0851      OT LONG TERM GOAL #1   Title  Pt will be mod I with upgraded HEP for LUE - 04/05/2019 (date adjusted to allow for Medicaid approval)    Baseline  dependent    Status  Achieved      OT LONG TERM GOAL #2   Title  Pt will demonstate understanding of spasticity mgmt techniques for LUE    Baseline  dependent    Status  Achieved      OT LONG TERM GOAL #4   Title  Pt will demonstrate ability for shoulder flexion to 100* with 0/10 pain for HEP    Baseline  90* of shoulder flexion    Status  On-going            Plan - 03/29/19 0851    Clinical Impression Statement  Pt has met goals and will see MD for spasticity mgmt of LUE this coming Friday. WIll place pt on hold for 1 month in case any issues with splinting arise. PT and pt to monitor closely.  Pt in agreement.    OT Occupational Profile and History  Detailed Assessment- Review of Records and additional review of physical, cognitive, psychosocial history related to current functional performance    Occupational performance deficits  (Please refer to evaluation for details):  ADL's;IADL's    Body Structure / Function / Physical Skills  ADL;Edema;Sensation;ROM    Rehab Potential  Good    Clinical Decision Making  Limited treatment options, no task modification necessary  Comorbidities Affecting Occupational Performance:  May have comorbidities impacting occupational performance    OT Frequency  1x / week    OT Duration  Other (comment)   5 weeks   OT Treatment/Interventions  Self-care/ADL training;Therapeutic exercise;Neuromuscular education;Passive range of motion;Splinting;Therapeutic activities;Patient/family education    Plan  place on hold for one month in the event that any OT issues arise.    Consulted and Agree with Plan of Care  Patient       Patient will benefit from skilled therapeutic intervention in order to improve the following deficits and impairments:   Body Structure / Function / Physical Skills: ADL, Edema, Sensation, ROM       Visit Diagnosis: Other symptoms and signs involving the nervous system  Other symptoms and signs involving the musculoskeletal system  Stiffness of left hand, not elsewhere classified  Stiffness of left shoulder, not elsewhere classified    Problem List Patient Active Problem List   Diagnosis Date Noted  . Malignant neoplasm of prostate ([place]) 08/19/2018    Quay Burow, OTR/L 03/29/2019, 8:55 AM  [facility] [address] [place], [place], [address] Phone: [phone]   Fax:  [phone]  Name: [name] MRN: [id number] Date of Birth: 1960-08-11

## 2019-03-29 NOTE — Patient Instructions (Signed)
Your Splint This splint should initially be fitted by a healthcare practitioner.  The healthcare practitioner is responsible for providing wearing instructions and precautions to the patient, other healthcare practitioners and care provider involved in the patient's care.  This splint was custom made for you. Please read the following instructions to learn about wearing and caring for your splint.  Precautions Should your splint cause any of the following problems, remove the splint immediately, STOP wearing it and contact your therapist.   Swelling  Severe Pain  Pressure Areas  Stiffness  Numbness   When To Wear Your Splint: At first you will wear your splint for short periods of time during the day to build up a tolerance to it.  Here is what you will do: Monday: Day One you will wear it one hour in the morning and one hour in the late afternoon. Check your skin - If no problems then Tuesday: Day Two you will wear it TWO hours in the morning and TWO hours in the late afternoon. If no problems then Wednesday: Day Three you will wear it THREE hours in the morning and THREE hours in the afternoon. If no problems then Thursday: Day Four you will wear it FOUR hours in the morning and FOUR hours in the afternoon. If no problems then  Friday: Day Five you will ONLY wear it when you go to sleep at night. Do not wear it during the day at this point.   Care and Cleaning of Your Splint 1. Keep your splint away from open flames. 2. Your splint will lose its shape in temperatures over 135 degrees Farenheit, ( in car windows, near radiators, ovens or in hot water).  Never make any adjustments to your splint, if the splint needs adjusting remove it and make an appointment to see your therapist. 3. Your splint, including the cushion liner may be cleaned with soap and lukewarm water.  Do not immerse in hot water over 135 degrees Farenheit. 4. Straps may be washed with soap and water, but do not  moisten the self-adhesive portion. 5. For ink or hard to remove spots use a scouring cleanser which contains chlorine.  Rinse the splint thoroughly after using chlorine cleanser.     Instructions

## 2019-03-31 ENCOUNTER — Encounter: Payer: Self-pay | Admitting: Physical Therapy

## 2019-03-31 ENCOUNTER — Ambulatory Visit: Payer: Medicaid Other | Admitting: Physical Therapy

## 2019-03-31 ENCOUNTER — Other Ambulatory Visit: Payer: Self-pay

## 2019-03-31 DIAGNOSIS — R2681 Unsteadiness on feet: Secondary | ICD-10-CM

## 2019-03-31 DIAGNOSIS — R29898 Other symptoms and signs involving the musculoskeletal system: Secondary | ICD-10-CM

## 2019-03-31 DIAGNOSIS — R293 Abnormal posture: Secondary | ICD-10-CM

## 2019-03-31 DIAGNOSIS — M6281 Muscle weakness (generalized): Secondary | ICD-10-CM

## 2019-03-31 DIAGNOSIS — R262 Difficulty in walking, not elsewhere classified: Secondary | ICD-10-CM

## 2019-03-31 DIAGNOSIS — R29818 Other symptoms and signs involving the nervous system: Secondary | ICD-10-CM

## 2019-03-31 DIAGNOSIS — R296 Repeated falls: Secondary | ICD-10-CM

## 2019-03-31 DIAGNOSIS — R2689 Other abnormalities of gait and mobility: Secondary | ICD-10-CM

## 2019-03-31 NOTE — Therapy (Signed)
[facility] [address] [place], [place], [address] Phone: [phone]   Fax:  [phone]  Physical Therapy Treatment  Patient Details  Name: [name] MRN: [id number] Date of Birth: 12/04/1960 Referring Provider (PT): Ezekiel Ina, MD   Encounter Date: 03/31/2019  PT End of Session - 03/31/19 0808    Visit Number  10    Number of Visits  13   eval plus 12 visits   Date for PT Re-Evaluation  04/05/19    Authorization Type  12 visits from 8/31 through 04/11/19    Authorization - Visit Number  5    Authorization - Number of Visits  12    PT Start Time  0802    PT Stop Time  0840    PT Time Calculation (min)  38 min    Equipment Utilized During Treatment  Gait belt    Activity Tolerance  Patient tolerated treatment well    Behavior During Therapy  [facility] for tasks assessed/performed       Past Medical History:  Diagnosis Date  . Arthritis   . 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 COPD (chronic obstructive pulmonary disease) ([place])   . GERD (gastroesophageal reflux disease)   . 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 History of gout   . History of kidney stones   . Hyperlipidemia   . Hypertension   . Prostate cancer [facility]) urologist-- dr Darene Lamer. Tresa Moore;  oncologist-  dr [name]   dx 07-20-2017  . Renal calculi   . Stroke ([place]) sept 26,  20006    hx mini strokes in past, 1 large stroke  . Weakness of left side of body 03/2005   cva residual  . Weakness of one side of body    L sided weakness after stroke    Past Surgical History:  Procedure Laterality Date  . COLONOSCOPY WITH PROPOFOL N/A 09/27/2015   Procedure: COLONOSCOPY WITH PROPOFOL;  Surgeon: [name], MD;  [facility];  Service: Endoscopy;  Laterality: N/A;  . CYSTOSCOPY N/A 10/05/2012   Procedure:  CYSTOSCOPY, retrograde and right stent placement;  Surgeon: [name], MD;  [facility];  Service: Urology;  Laterality: N/A;  . CYSTOSCOPY WITH RETROGRADE PYELOGRAM, URETEROSCOPY AND STENT PLACEMENT Right 04/13/2014   Procedure: CYSTOSCOPY WITH RETROGRADE PYELOGRAM, URETEROSCOPY AND STENT PLACEMENT, LASER ABLATION OF SCAR TISSUE;  Surgeon: [name], MD;  [facility];  Service: Urology;  Laterality: Right;  . EYE SURGERY Left age 58  . HOLMIUM LASER APPLICATION Right 60/67/2094   Procedure: HOLMIUM LASER APPLICATION;  Surgeon: [name], MD;  [facility];  Service: Urology;  Laterality: Right;  . NEPHROLITHOTOMY Right 10/05/2012   Procedure: NEPHROLITHOTOMY PERCUTANEOUS;  Surgeon: [name], MD;  [facility];  Service: Urology;  Laterality: Right;  WITH CYSTO AND SURGEON ACCESS   . NEPHROLITHOTOMY Right 10/07/2012   Procedure: NEPHROLITHOTOMY PERCUTANEOUS SECOND LOOK. Stent exchange,Ureteroscopy ;  Surgeon: [name], MD;  [facility];  Service: Urology;  Laterality: Right;  . RADIOACTIVE SEED IMPLANT N/A 11/12/2018   Procedure: RADIOACTIVE SEED IMPLANT/BRACHYTHERAPY IMPLANT;  Surgeon: [name], MD;  [facility];  Service: Urology;  Laterality: N/A;  90 MINS  . SPACE OAR INSTILLATION N/A 11/12/2018   Procedure: SPACE OAR INSTILLATION;  Surgeon: [name], MD;  [facility];  Service: Urology;  Laterality: N/A;  . TOENAIL EXCISION      There were no vitals filed for this visit.  Subjective Assessment - 03/31/19 0807    Subjective  No new complaints. No falls or pain to report. Does report the stretching is difficult to do sometimes at home due to being tighter at some times than others.    Pertinent History  R CVA (03/26/2005); blind in L eye and low vision in R eye; HLD, HTN, COPD, prostate cancer    Limitations  Walking;Standing    How long can you walk comfortably?  to his mailbox (about 50 ft)    Patient Stated Goals  wants  to get his L leg working again    Currently in Pain?  No/denies    Pain Score  0-No pain            [facility] Adult PT Treatment/Exercise - 03/31/19 0809      Knee/Hip Exercises: Stretches   Active Hamstring Stretch  Both;3 reps;30 seconds;Limitations    Active Hamstring Stretch Limitations  seated at edge of mat with cues on form/hold times      Knee/Hip Exercises: Aerobic   Other Aerobic  SciFit with LEs/right UE level 2.5 for 6 minutes with goal >/=40 rpm for strengthening and activity tolerance.          Balance Exercises - 03/31/19 0847      Balance Exercises: Standing   Rockerboard  Anterior/posterior;Lateral;Head turns;EO;EC;30 seconds;10 reps    Balance Beam  standing across blue foam beam: alternating fwd stepping to floor/back onto beam, then alternating bwd stepping to floor/back onto beam with intermittent right UE support for balance. 10 reps each on each side with min guard to min assist, cues for increased step length/height.        Balance Exercises: Standing   Rebounder Limitations  performed both ways on balance board with no to intermittent UE support: rocking the board with emphasis on tall posture with EO, progressing to EC; then holding the board steady for EC no head movements, progressing to EC head movements left<>right, then up<>down. min to mod assist for balance with cues for posture and increased left LE weight bearing.           PT Short Term Goals - 03/15/19 0731      PT SHORT TERM GOAL #1   Title  Patient will be independent with initial HEP for balance and LE strenghtening in order to build upon functional gains in therapy. ALL STGS DUE BY 6TH VISIT.    Baseline  needs assistance getting into position for bridging, needs max cueing for quad sets.    Time  6    Period  --   6th visit   Status  Not Met      PT SHORT TERM GOAL #2   Title  Patient will decrease TUG score using single point cane to at least 53 seconds in order to decrease risk  of falls.    Baseline  29.88 seconds on 03/15/19 with SPC and not using AFO    Status  Achieved      PT SHORT TERM GOAL #3   Title  Patient will ambulate 56' with supervision with single point cane in order to improve mobility for household distances.    Baseline  supervision/min guard for 20' with use of SPC, no AFO    Status  Partially Met      PT SHORT TERM GOAL #4  Title  Patient will perform w/c mobility with power w/c outside over paved/cement surfaces for 250' with mod I in order to improve community mobility and safety.    Baseline  pt not able to bring in power w/c to session due to pt driving to and from appointments    Status  Not Met      PT SHORT TERM GOAL #5   Title  Patient will tolerate static standing balance x3 minutes at countertop with supervision in order to increase endurance for ADLs.    Baseline  met on 03/15/19 with single RUE support    Status  Achieved        PT Long Term Goals - 03/15/19 1006      PT LONG TERM GOAL #1   Title  Patient will be independent with initial HEP for balance and LE strengthening in order to build upon functional gains in therapy. ALL LTGs DUE 04/05/19    Baseline  dependent with HEP    Time  6    Period  Weeks    Status  New      PT LONG TERM GOAL #2   Title  Patient will ambulate 65-80' with supervision with single point  cane and AFO in order to improve mobility for household distances.    Baseline  min guard/ min A with single point cane on 02/15/19    Status  New      PT LONG TERM GOAL #3   Title  Patient will tolerate static standing balance x5 minutes at countertop with supervision in order to increase endurance for ADLs.    Baseline  not yet assessed    Status  New      PT LONG TERM GOAL #4   Title  Patient will decrease TUG score using single point cane and AFO to at least 25 seconds in order to decrease risk of falls.    Baseline  29.88 seconds on 02/15/19    Status  Revised      PT LONG TERM GOAL #5   Title   Patient will perform at least 4 sit <> stands from arm chair with UE support during 30 second chair in order to demonstrate improved LE strength.    Baseline  2 sit <> stands during 30 seconds    Status  New      PT LONG TERM GOAL #6   Title  Patient will perform w/c mobility with power w/c outside over paved/cement surfaces and around obstacles for 500' with mod I in order to improve community mobility and safety.    Baseline  not yet assessed    Status  New      PT LONG TERM GOAL #7   Title  Patient will perform stand pivot t/f from power w/c <> mat table using single point cane and mod I in order to improve safety at home.    Baseline  not yet assessed    Status  New            Plan - 03/31/19 3810    Clinical Impression Statement  Today's skilled session continued to focus on strengthening, LE stretching and balance reactions. The pt continues to need cues for upright posture with gait and standing activities. The pt is progressing with ability to correct posture with cues, unable to sustain for long duration. The pt should benefit from continued PT to progress toward unmet goals.    Personal Factors and Comorbidities  Comorbidity 3+;Past/Current Experience;Time  since onset of injury/illness/exacerbation    Comorbidities  HTN, HLD, prostate cancer, COPD, blind in L eye. OA and gout.    Examination-Activity Limitations  Locomotion Level;Stand;Transfers    Stability/Clinical Decision Making  Evolving/Moderate complexity    Rehab Potential  Fair    PT Frequency  2x / week    PT Duration  6 weeks    PT Treatment/Interventions  ADLs/Self Care Home Management;DME Instruction;Gait training;Functional mobility training;Neuromuscular re-education;Balance training;Therapeutic exercise;Therapeutic activities;Patient/family education;Wheelchair mobility training;Passive range of motion;Orthotic Fit/Training;Energy conservation    PT Next Visit Plan  Sit <> stands with focus on LLE weight  shifting and no UE when sitting. Gait training outdoors / around chairs/obstacles in gym. Balance strategies on compliant surfaces. Activity tolerance in standing and weight shifting.    PT Home Exercise Plan  [id number]    Consulted and Agree with Plan of Care  Patient       Patient will benefit from skilled therapeutic intervention in order to improve the following deficits and impairments:  Abnormal gait, Decreased activity tolerance, Decreased balance, Decreased endurance, Decreased coordination, Decreased mobility, Decreased range of motion, Difficulty walking, Decreased strength, Impaired tone, Postural dysfunction  Visit Diagnosis: Other symptoms and signs involving the nervous system  Other symptoms and signs involving the musculoskeletal system  Unsteadiness on feet  Abnormal posture  Muscle weakness (generalized)  Difficulty in walking, not elsewhere classified  Other abnormalities of gait and mobility  Repeated falls     Problem List Patient Active Problem List   Diagnosis Date Noted  . Malignant neoplasm of prostate ([place]) 08/19/2018    Willow Ora, PTA, [facility] [address], [place], [place] [address] [phone] 03/31/19, 9:50 PM   Name: MACKAY HANAUER MRN: [id number] Date of Birth: 06/03/1961

## 2019-04-02 ENCOUNTER — Encounter: Payer: Self-pay | Admitting: Physical Medicine & Rehabilitation

## 2019-04-02 ENCOUNTER — Encounter: Payer: Medicaid Other | Attending: Physical Medicine & Rehabilitation | Admitting: Physical Medicine & Rehabilitation

## 2019-04-02 ENCOUNTER — Other Ambulatory Visit: Payer: Self-pay

## 2019-04-02 VITALS — BP 150/83 | HR 102 | Temp 98.1°F | Ht 65.5 in | Wt 229.0 lb

## 2019-04-02 DIAGNOSIS — I639 Cerebral infarction, unspecified: Secondary | ICD-10-CM | POA: Diagnosis not present

## 2019-04-02 DIAGNOSIS — G8114 Spastic hemiplegia affecting left nondominant side: Secondary | ICD-10-CM | POA: Diagnosis not present

## 2019-04-02 DIAGNOSIS — I69354 Hemiplegia and hemiparesis following cerebral infarction affecting left non-dominant side: Secondary | ICD-10-CM | POA: Diagnosis not present

## 2019-04-02 DIAGNOSIS — I6930 Unspecified sequelae of cerebral infarction: Secondary | ICD-10-CM | POA: Diagnosis not present

## 2019-04-02 NOTE — Patient Instructions (Signed)

## 2019-04-02 NOTE — Progress Notes (Signed)
Subjective:    Patient ID: [name], male    DOB: 1961/06/09, 58 y.o.   MRN: [id number]  HPI 58 year old male kindly referred by Occupational Therapy for the evaluation of left upper extremity spasticity. 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  The patient had been seen in clinic and reports having had a botulinum toxin injection at that time but written records were not available on premises at this office. The patient has had physical therapy and is currently undergoing both PT and OT. Patient is on baclofen 3 times per day  The patient has difficulty extending his fingers he has some tightness in his left shoulder and also has difficulty extending his left elbow. Pain Inventory Average Pain 0 Pain Right Now 0 My pain is numbness  In the last 24 hours, has pain interfered with the following? General activity 0 Relation with others 0 Enjoyment of life 0 What TIME of day is your pain at its worst? na Sleep (in general) Fair  Pain is worse with: na Pain improves with: elevate arm Relief from Meds: na  Mobility walk with assistance use a cane  Function disabled: date disabled na I need assistance with the following:  dressing, bathing, toileting, meal prep, household duties and shopping  Neuro/Psych bladder control problems weakness numbness  Prior Studies Any changes since last visit?  no CRANIAL CT WITHOUT CONTRAST  03/20/2005:   Technique:  74mm axial images were obtained from the skull base through the vertex without intravenous contrast.   Comparison:  None.   Findings:  There is a hypertensive hemorrhagic stroke involving the right basal ganglia. The hematoma measures maximally 3.4 x 3.0 cm. There is edema surrounding the hemorrhage, and there is mass-effect with compression of the right lateral ventricle. There is minimal midline shift, however. There are scattered areas of low attenuation in the  white matter diffusely consistent with small vessel disease related to the history of hypertension. What I believe is an old lacunar stroke is noted in the lateral left basal ganglia. There is no evidence for a cortical stroke.   The bone window images demonstrate opacification of multiple bilateral ethmoid air cells. Mucosal thickening is present in both maxillary sinuses. There is mucosal thickening in the frontal sinuses. The sphenoid sinuses and the mastoid air cells are well-aerated. Bilateral vertebral artery and carotid siphon atherosclerotic calcification is noted.   IMPRESSION:   1. 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 There is associated mass-effect with compression of the right lateral ventricle. There is minimal midline shift. 2. Moderate changes of small vessel disease of the white matter diffusely. 3. Old lacunar stroke in the left basal ganglia. 4. Chronic pansinusitis.   These results were telephoned directly to Dr. Roxanne Mins in the emergency department at the time of interpretation on 03/20/2005 at 0040 hours.  Physicians involved in your care physical therapist   Family History  Problem Relation Age of Onset  . Brain cancer Maternal Grandfather    Social History   Socioeconomic History  . Marital status: Single    Spouse name: Not on file  . Number of children: 0  . Years of education: Not on file  . Highest education level: Not on file  Occupational History    Comment: disability  Social Needs  . Financial resource strain: Not on file  . Food insecurity    Worry: Not on file    Inability: Not on  file  . Transportation needs    Medical: No    Non-medical: No  Tobacco Use  . Smoking status: Current Every Day Smoker    Packs/day: 0.50    Years: 15.00    Pack years: 7.50    Types: Cigarettes  . Smokeless tobacco: Never Used  Substance and Sexual Activity  . Alcohol use: No  . Drug use: No  . Sexual activity: Not  Currently  Lifestyle  . Physical activity    Days per week: Not on file    Minutes per session: Not on file  . Stress: Not on file  Relationships  . Social Herbalist on phone: Not on file    Gets together: Not on file    Attends religious service: Not on file    Active member of club or organization: Not on file    Attends meetings of clubs or organizations: Not on file    Relationship status: Not on file  Other Topics Concern  . Not on file  Social History Narrative  . Not on file   Past Surgical History:  Procedure Laterality Date  . COLONOSCOPY WITH PROPOFOL N/A 09/27/2015   Procedure: COLONOSCOPY WITH PROPOFOL;  Surgeon: [name], MD;  Location: [facility];  Service: Endoscopy;  Laterality: N/A;  . CYSTOSCOPY N/A 10/05/2012   Procedure: CYSTOSCOPY, retrograde and right stent placement;  Surgeon: [name], MD;  Location: [facility];  Service: Urology;  Laterality: N/A;  . CYSTOSCOPY WITH RETROGRADE PYELOGRAM, URETEROSCOPY AND STENT PLACEMENT Right 04/13/2014   Procedure: CYSTOSCOPY WITH RETROGRADE PYELOGRAM, URETEROSCOPY AND STENT PLACEMENT, LASER ABLATION OF SCAR TISSUE;  Surgeon: [name], MD;  Location: [facility];  Service: Urology;  Laterality: Right;  . EYE SURGERY Left age 1  . HOLMIUM LASER APPLICATION Right [id number]   Procedure: HOLMIUM LASER APPLICATION;  Surgeon: [name], MD;  Location: [facility];  Service: Urology;  Laterality: Right;  . NEPHROLITHOTOMY Right 10/05/2012   Procedure: NEPHROLITHOTOMY PERCUTANEOUS;  Surgeon: [name], MD;  Location: [facility];  Service: Urology;  Laterality: Right;  WITH CYSTO AND SURGEON ACCESS   . NEPHROLITHOTOMY Right 10/07/2012   Procedure: NEPHROLITHOTOMY PERCUTANEOUS SECOND LOOK. Stent exchange,Ureteroscopy ;  Surgeon: [name], MD;  Location: [facility];  Service: Urology;  Laterality: Right;  . RADIOACTIVE SEED IMPLANT N/A 11/12/2018   Procedure: RADIOACTIVE SEED IMPLANT/BRACHYTHERAPY IMPLANT;   Surgeon: [name], MD;  Location: [facility];  Service: Urology;  Laterality: N/A;  90 MINS  . SPACE OAR INSTILLATION N/A 11/12/2018   Procedure: SPACE OAR INSTILLATION;  Surgeon: [name], MD;  Location: [facility];  Service: Urology;  Laterality: N/A;  . TOENAIL EXCISION     Past Medical History:  Diagnosis Date  . Arthritis   . 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 COPD (chronic obstructive pulmonary disease) ([place])   . GERD (gastroesophageal reflux disease)   . 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 History of gout   . History of kidney stones   . Hyperlipidemia   . Hypertension   . Prostate cancer [facility]) urologist-- dr Darene Lamer. [facility];  oncologist-  dr [name]   dx 07-20-2017  . Renal calculi   . Stroke ([place]) sept 26,  20006    hx mini strokes in past, 1 large stroke  . Weakness of left side of body  03/2005   cva residual  . Weakness of one side of body    L sided weakness after stroke   BP (!) 150/83   Pulse (!) 102   Temp 98.1 F (36.7 C)   Ht 5' 5.5" (1.664 m)   Wt 229 lb (103.9 kg)   SpO2 91%   BMI 37.53 kg/m   Opioid Risk Score:   Fall Risk Score:  `1  Depression screen PHQ 2/9  No flowsheet data found.  Review of Systems  Respiratory: Positive for cough, shortness of breath and wheezing.   Neurological: Positive for weakness and numbness.  All other systems reviewed and are negative.      Objective:   Physical Exam Constitutional:      Appearance: Normal appearance.  HENT:     Head: Normocephalic and atraumatic.  Cardiovascular:     Rate and Rhythm: Normal rate and regular rhythm.     Heart sounds: Normal heart sounds. No murmur.  Pulmonary:     Effort: Pulmonary effort is normal. No respiratory distress.     Breath sounds: Normal breath sounds.  Abdominal:     General: Abdomen is flat. Bowel sounds are normal.  There is no distension.     Palpations: Abdomen is soft.  Skin:    General: Skin is warm and dry.  Neurological:     Mental Status: He is alert and oriented to person, place, and time.     Comments: Strength is 3- at the left deltoid 3- at the left triceps and biceps.  To minus wrist flexion and extension to minus thumb flexion extension 0 at the finger flexors and extensors. PIPs in the left hand cannot be extended in digits 2 through 5. Tone is [place] 3 at the pec on the left 3 at the elbow flexors on the left 3 at the wrist flexors on the left and 4 at the IP flexors on the left digits 2 through 5  Psychiatric:        Mood and Affect: Mood normal.        Behavior: Behavior normal.    Patient is in no acute distress Mood and affect are appropriate  Ambulates with a cane no evidence of toe drag or knee instability Left lower extremity is 4- at the hip flexor knee extensor and ankle dorsiflexion plantarflexion     Assessment & Plan:  1.  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 The patient has increased tone at the pectoralis elbow flexors wrist flexors.  His finger flexors may have increased tone but there may be some underlying contracture at the IP joints to digits 2 through 5. Would recommend botulinum toxin injection and should do well with his pecs elbow flexors wrist flexor, we will have to see how much extension he gets had his IP joints digits 2 through 5 on the left side given the possibility of underlying contracture  Botox Injection for spasticity using needle EMG guidance  Dilution: 50 Units/ml Indication: Severe spasticity which interferes with ADL,mobility and/or  hygiene and is unresponsive to medication management and other conservative care Informed consent was obtained after describing risks and benefits of the procedure with the patient. This includes bleeding, bruising, infection, excessive weakness, or medication side  effects. A REMS form is on file and signed. Needle: 27g 1" needle electrode Number of units per muscle Pectoralis75 Biceps75 Brachiorad 25 FCR50  FDS50 FDP25  All injections were done after obtaining appropriate EMG activity and  after negative drawback for blood. The patient tolerated the procedure well. Post procedure instructions were given. A followup appointment was made.   Resume OT in 1-2 wks  RTC 6wks PM&R

## 2019-04-05 ENCOUNTER — Ambulatory Visit: Payer: Medicaid Other | Attending: Internal Medicine | Admitting: Physical Therapy

## 2019-04-05 ENCOUNTER — Ambulatory Visit: Payer: Medicaid Other | Admitting: Occupational Therapy

## 2019-04-05 ENCOUNTER — Encounter: Payer: Self-pay | Admitting: Physical Therapy

## 2019-04-05 ENCOUNTER — Other Ambulatory Visit: Payer: Self-pay

## 2019-04-05 DIAGNOSIS — R2689 Other abnormalities of gait and mobility: Secondary | ICD-10-CM | POA: Diagnosis present

## 2019-04-05 DIAGNOSIS — R296 Repeated falls: Secondary | ICD-10-CM | POA: Insufficient documentation

## 2019-04-05 DIAGNOSIS — R29818 Other symptoms and signs involving the nervous system: Secondary | ICD-10-CM

## 2019-04-05 DIAGNOSIS — R293 Abnormal posture: Secondary | ICD-10-CM | POA: Diagnosis present

## 2019-04-05 DIAGNOSIS — R2681 Unsteadiness on feet: Secondary | ICD-10-CM | POA: Diagnosis present

## 2019-04-05 DIAGNOSIS — R262 Difficulty in walking, not elsewhere classified: Secondary | ICD-10-CM | POA: Diagnosis present

## 2019-04-05 DIAGNOSIS — M6281 Muscle weakness (generalized): Secondary | ICD-10-CM

## 2019-04-05 NOTE — Therapy (Signed)
[facility] [address] [place], [place], [address] Phone: [phone]   Fax:  [phone]  Physical Therapy Treatment  Patient Details  Name: [name] MRN: [id number] Date of Birth: 12/19/1960 Referring Provider (PT): Ezekiel Ina, MD   Encounter Date: 04/05/2019  PT End of Session - 04/05/19 0809    Visit Number  11    Number of Visits  15   eval plus 14 visits    Date for PT Re-Evaluation  04/19/19    Authorization Type  12 visits from 8/31 through 04/11/19    Authorization - Visit Number  10    Authorization - Number of Visits  12    PT Start Time  0715    PT Stop Time  2585    PT Time Calculation (min)  43 min    Equipment Utilized During Treatment  Gait belt    Activity Tolerance  Patient tolerated treatment well    Behavior During Therapy  [facility] for tasks assessed/performed       Past Medical History:  Diagnosis Date  . Arthritis   . 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 COPD (chronic obstructive pulmonary disease) ([place])   . GERD (gastroesophageal reflux disease)   . 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 History of gout   . History of kidney stones   . Hyperlipidemia   . Hypertension   . Prostate cancer [facility]) urologist-- dr Darene Lamer. [facility];  oncologist-  dr [name]   dx 07-20-2017  . Renal calculi   . Stroke ([place]) sept 26,  20006    hx mini strokes in past, 1 large stroke  . Weakness of left side of body 03/2005   cva residual  . Weakness of one side of body    L sided weakness after stroke    Past Surgical History:  Procedure Laterality Date  . COLONOSCOPY WITH PROPOFOL N/A 09/27/2015   Procedure: COLONOSCOPY WITH PROPOFOL;  Surgeon: [name], MD;  Location: [facility];  Service: Endoscopy;  Laterality: N/A;  . CYSTOSCOPY N/A 10/05/2012   Procedure:  CYSTOSCOPY, retrograde and right stent placement;  Surgeon: [name], MD;  Location: [facility];  Service: Urology;  Laterality: N/A;  . CYSTOSCOPY WITH RETROGRADE PYELOGRAM, URETEROSCOPY AND STENT PLACEMENT Right 04/13/2014   Procedure: CYSTOSCOPY WITH RETROGRADE PYELOGRAM, URETEROSCOPY AND STENT PLACEMENT, LASER ABLATION OF SCAR TISSUE;  Surgeon: [name], MD;  Location: [facility];  Service: Urology;  Laterality: Right;  . EYE SURGERY Left age 58  . HOLMIUM LASER APPLICATION Right 27/78/2423   Procedure: HOLMIUM LASER APPLICATION;  Surgeon: [name], MD;  Location: [facility];  Service: Urology;  Laterality: Right;  . NEPHROLITHOTOMY Right 10/05/2012   Procedure: NEPHROLITHOTOMY PERCUTANEOUS;  Surgeon: [name], MD;  Location: [facility];  Service: Urology;  Laterality: Right;  WITH CYSTO AND SURGEON ACCESS   . NEPHROLITHOTOMY Right 10/07/2012   Procedure: NEPHROLITHOTOMY PERCUTANEOUS SECOND LOOK. Stent exchange,Ureteroscopy ;  Surgeon: [name], MD;  Location: [facility];  Service: Urology;  Laterality: Right;  . RADIOACTIVE SEED IMPLANT N/A 11/12/2018   Procedure: RADIOACTIVE SEED IMPLANT/BRACHYTHERAPY IMPLANT;  Surgeon: [name], MD;  Location: [facility];  Service: Urology;  Laterality: N/A;  90 MINS  . SPACE OAR INSTILLATION N/A 11/12/2018   Procedure: SPACE OAR INSTILLATION;  Surgeon: [name], MD;  Location: [facility];  Service: Urology;  Laterality: N/A;  . TOENAIL EXCISION      There were no vitals filed for this visit.  Subjective Assessment - 04/05/19 0719    Subjective  Got Botox the other day for his LUE - has not yet noticed a difference. Did not wear his AFO today - he didn't have time to put it out. No falls. Feeling stiff today from the weather. States that his balance is getting better, feels more confidence with his balance and is able to stand up for a longer period of time.    Pertinent History  R CVA (03/26/2005); blind in L eye and low vision in R eye; HLD,  HTN, COPD, prostate cancer    Limitations  Walking;Standing    How long can you walk comfortably?  to his mailbox (about 50 ft)    Patient Stated Goals  wants to get his L leg working again    Currently in Pain?  No/denies         [facility] PT Assessment - 04/05/19 0727      Assessment   Medical Diagnosis  Late effects of R CVA    Referring Provider (PT)  Ezekiel Ina, MD      Standardized Balance Assessment   Standardized Balance Assessment  Berg Balance Test      Berg Balance Test   Sit to Stand  Able to stand without using hands and stabilize independently    Standing Unsupported  Able to stand safely 2 minutes    Sitting with Back Unsupported but Feet Supported on Floor or Stool  Able to sit safely and securely 2 minutes    Stand to Sit  Sits safely with minimal use of hands    Transfers  Able to transfer safely, minor use of hands    Standing Unsupported with Eyes Closed  Able to stand 10 seconds safely    Standing Unsupported with Feet Together  Able to place feet together independently and stand 1 minute safely    From Standing, Reach Forward with Outstretched Arm  Can reach confidently >25 cm (10")    From Standing Position, Pick up Object from Floor  Able to pick up shoe, needs supervision    From Standing Position, Turn to Look Behind Over each Shoulder  Looks behind from both sides and weight shifts well    Turn 360 Degrees  Able to turn 360 degrees safely but slowly   both R and L approx. 7 seconds   Standing Unsupported, Alternately Place Feet on Step/Stool  Needs assistance to keep from falling or unable to try    Standing Unsupported, One Foot in [place] to take small step independently and hold 30 seconds    Standing on One Leg  Tries to lift leg/unable to hold 3 seconds but remains standing independently    Total Score  44    Berg comment:  44/56      Timed Up and Go Test   Normal TUG (seconds)  13.5   with SPC, not wearing L AFO                   [facility] Adult PT Treatment/Exercise - 04/05/19 0727      Transfers   Transfers  Stand to Sit;Sit to Stand    Sit to Stand  5: Supervision;With upper extremity assist;Without upper extremity assist    Sit to Stand Details (indicate cue type and reason)  from low blue  mat table with no UE assist, pt with better ability to demonstrate improved forward weight shift, single UE support from single arm chair    Five time sit to stand comments   17.89 seconds with single UE support from arm chair    Stand to Sit  5: Supervision;With upper extremity assist;Without upper extremity assist    Comments  30 second chair stand: 8 with single UE support from arm chair      Ambulation/Gait   Ambulation/Gait  Yes    Ambulation/Gait Assistance  5: Supervision    Ambulation/Gait Assistance Details  Pt did not ambulate with his L AFO today, noted increased foot flat on LLE today. After standing marching and side stepping at countertop pt demonstrated improved L hip and knee flexion during swing phase.    Ambulation Distance (Feet)  100 Feet   approx. throughout session   Assistive device  Straight cane   no L AFO today   Gait Pattern  Decreased stance time - left;Decreased arm swing - left;Decreased stride length;Decreased weight shift to left;Decreased dorsiflexion - left;Decreased hip/knee flexion - left;Trunk flexed;Poor foot clearance - left;Wide base of support;Left circumduction;Step-through pattern;Decreased step length - right;Left foot flat    Ambulation Surface  Level;Indoor      Balance   Balance Assessed  Yes      Dynamic Standing Balance   Dynamic Standing - Comments  Able to stand at countertop x6.5 minutes with supervision and no UE support vs. single RUE support while performing dynamic tasks - reaching superiorly/laterally with RUE outside of BOS, A/P staggered weight shifting, medial/lateral weight shifting, as well as static standing balance with no UE support on  countertop      High Level Balance   High Level Balance Comments  Side stepping down and back at countertop with single UE support: 3 reps, standing marching at countertop with cues for performing slowly 2 x 10 reps - added to HEP               PT Short Term Goals - 03/15/19 0731      PT SHORT TERM GOAL #1   Title  Patient will be independent with initial HEP for balance and LE strenghtening in order to build upon functional gains in therapy. ALL STGS DUE BY 6TH VISIT.    Baseline  needs assistance getting into position for bridging, needs max cueing for quad sets.    Time  6    Period  --   6th visit   Status  Not Met      PT SHORT TERM GOAL #2   Title  Patient will decrease TUG score using single point cane to at least 53 seconds in order to decrease risk of falls.    Baseline  29.88 seconds on 03/15/19 with SPC and not using AFO    Status  Achieved      PT SHORT TERM GOAL #3   Title  Patient will ambulate 80' with supervision with single point cane in order to improve mobility for household distances.    Baseline  supervision/min guard for 20' with use of SPC, no AFO    Status  Partially Met      PT SHORT TERM GOAL #4   Title  Patient will perform w/c mobility with power w/c outside over paved/cement surfaces for 250' with mod I in order to improve community mobility and safety.    Baseline  pt not able to bring in power w/c to session  due to pt driving to and from appointments    Status  Not Met      PT SHORT TERM GOAL #5   Title  Patient will tolerate static standing balance x3 minutes at countertop with supervision in order to increase endurance for ADLs.    Baseline  met on 03/15/19 with single RUE support    Status  Achieved        PT Long Term Goals - 04/05/19 0726      PT LONG TERM GOAL #1   Title  Patient will be independent with initial HEP for balance and LE strengthening in order to build upon functional gains in therapy. ALL LTGs DUE 04/05/19     Baseline  on-going HEP for LE strength and standing balance    Time  6    Period  Weeks    Status  On-going      PT LONG TERM GOAL #2   Title  Patient will ambulate 65-80' with supervision with single point  cane and AFO in order to improve mobility for household distances.    Baseline  approx. 100' clinic distances today with SPC and without AFO donned with supervision on 04/05/19    Status  Achieved      PT LONG TERM GOAL #3   Title  Patient will tolerate static standing balance x5 minutes at countertop with supervision in order to increase endurance for ADLs.    Baseline  able to stand at countertop for 6.5 minutes while performing dynamic and static standing balance with supervision on 04/05/19    Status  Achieved      PT LONG TERM GOAL #4   Title  Patient will decrease TUG score using single point cane and AFO to at least 25 seconds in order to decrease risk of falls.    Baseline  13.5 seconds on 04/05/19 with no L AFO    Status  Achieved      PT LONG TERM GOAL #5   Title  Patient will perform at least 4 sit <> stands from arm chair with UE support during 30 second chair in order to demonstrate improved LE strength.    Baseline  8 sit <> stands from chair with single UE support with supervision.    Status  Achieved      PT LONG TERM GOAL #6   Title  Patient will perform w/c mobility with power w/c outside over paved/cement surfaces and around obstacles for 500' with mod I in order to improve community mobility and safety.    Baseline  pt will be bringing in his power w/c at next session.    Status  Deferred      PT LONG TERM GOAL #7   Title  Patient will perform stand pivot t/f from power w/c <> mat table using single point cane and mod I in order to improve safety at home.    Baseline  pt will be bringing in his power w/c at next session.    Status  Deferred         revised LTGs for re-auth:  PT Long Term Goals - 04/05/19 1219      PT LONG TERM GOAL #1   Title  Patient  will be independent with initial HEP for balance and LE strengthening in order to build upon functional gains in therapy. ALL LTGs DUE 04/05/19    Baseline  on-going HEP for LE strength and standing balance    Time  2  Period  Weeks    Status  On-going      PT LONG TERM GOAL #2   Title  Patient will ambulate 30' with mod I with single point  cane and AFO in order to improve mobility for household distances.    Baseline  approx. 16' clinic distances today with SPC and without AFO donned with supervision on 04/05/19    Time  2    Period  Weeks    Status  Revised      PT LONG TERM GOAL #3   Title  Patient will tolerate static standing balance x5 minutes at countertop with supervision in order to increase endurance for ADLs.    Baseline  able to stand at countertop for 6.5 minutes while performing dynamic and static standing balance with supervision on 04/05/19    Status  Achieved      PT LONG TERM GOAL #4   Title  Patient will decrease TUG score using single point cane and AFO to at least 25 seconds in order to decrease risk of falls.    Baseline  13.5 seconds on 04/05/19 with no L AFO    Status  Achieved      PT LONG TERM GOAL #5   Title  Patient will decrease 5 times sit <> stand time from chair with single arm rest to 15.5 seconds or less in order to demo improved LE strength.    Baseline  17.89 seconds  from chair with single UE support on 04/05/19.    Time  2    Period  Weeks    Status  Revised      Additional Long Term Goals   Additional Long Term Goals  Yes      PT LONG TERM GOAL #6   Title  Patient will perform w/c mobility with power w/c outside over paved/cement surfaces and around obstacles for 500' with mod I in order to improve community mobility and safety.    Baseline  pt will be bringing in his power w/c at next session.    Time  2    Period  Weeks    Status  Deferred      PT LONG TERM GOAL #7   Title  Patient will perform stand pivot t/f from power w/c <> mat  table using single point cane and mod I in order to improve safety at home.    Baseline  pt will be bringing in his power w/c at next session.    Time  2    Period  Weeks    Status  Deferred      PT LONG TERM GOAL #8   Title  Patient will improve baseline BERG score by at least 2 points in order to demo decreased fall risk.    Baseline  44/56 on 04/05/19    Time  2    Period  Weeks    Status  New         Plan - 04/05/19 9470    Clinical Impression Statement  Focus of today's session was assessing pt's LTGs: pt has met LTG #2, 3, 4, and 5. Pt's TUG score today with SPC and without his L AFO was 13.5 seconds, which lowers pt's fall risk (previously approx. 30 seconds). In regards to the 30 second chair stand, pt was able to perform 8 sit <> stands today with RUE support from arm chair (previously was only able to perform 2). Assessed BERG today, which pt scored a  44/56 - putting pt at a significant risk for falls. LTG #1 is on-going in regards to HEP. Deferred remaining LTGs due to pt being unable to bring in power w/c with him to appointment - pt will be receiving transpotation at next session to bring it in for any possible adjustments. Will need re-auth until 03/2018 for remaining vists to continue to focus on decreasing fall risk, gait training, standing balance, and finalizing pt's HEP - revised LTGs as appropriate.    Personal Factors and Comorbidities  Comorbidity 3+;Past/Current Experience;Time since onset of injury/illness/exacerbation    Comorbidities  HTN, HLD, prostate cancer, COPD, blind in L eye. OA and gout.    Examination-Activity Limitations  Locomotion Level;Stand;Transfers    Stability/Clinical Decision Making  Evolving/Moderate complexity    Rehab Potential  Fair    PT Frequency  2x / week    PT Duration  2 weeks    PT Treatment/Interventions  ADLs/Self Care Home Management;DME Instruction;Gait training;Functional mobility training;Neuromuscular re-education;Balance  training;Therapeutic exercise;Therapeutic activities;Patient/family education;Wheelchair mobility training;Passive range of motion;Orthotic Fit/Training;Energy conservation    PT Next Visit Plan  Assess w/c mobility - any adjustments that can be made? Start finalizing HEP. Gait training outdoors / around chairs/obstacles in gym. Balance strategies on compliant surfaces.    PT Home Exercise Plan  [email or web address]    Consulted and Agree with Plan of Care  Patient       Patient will benefit from skilled therapeutic intervention in order to improve the following deficits and impairments:  Abnormal gait, Decreased activity tolerance, Decreased balance, Decreased endurance, Decreased coordination, Decreased mobility, Decreased range of motion, Difficulty walking, Decreased strength, Impaired tone, Postural dysfunction  Visit Diagnosis: Other symptoms and signs involving the nervous system  Unsteadiness on feet  Abnormal posture  Muscle weakness (generalized)  Difficulty in walking, not elsewhere classified  Other abnormalities of gait and mobility     Problem List Patient Active Problem List   Diagnosis Date Noted  . Malignant neoplasm of prostate ([place]) 08/19/2018    Arliss Journey, PT, DPT  04/05/2019, 11:50 AM  [employer] [address] [place], [place], [address] Phone: [phone]   Fax:  [phone]  Name: DEACON GADBOIS MRN: [id number] Date of Birth: 04-20-1961

## 2019-04-07 ENCOUNTER — Other Ambulatory Visit: Payer: Self-pay

## 2019-04-07 ENCOUNTER — Ambulatory Visit: Payer: Medicaid Other | Admitting: Physical Therapy

## 2019-04-07 ENCOUNTER — Encounter: Payer: Self-pay | Admitting: Physical Therapy

## 2019-04-07 DIAGNOSIS — R293 Abnormal posture: Secondary | ICD-10-CM

## 2019-04-07 DIAGNOSIS — R29818 Other symptoms and signs involving the nervous system: Secondary | ICD-10-CM

## 2019-04-07 DIAGNOSIS — R2681 Unsteadiness on feet: Secondary | ICD-10-CM

## 2019-04-07 DIAGNOSIS — R262 Difficulty in walking, not elsewhere classified: Secondary | ICD-10-CM

## 2019-04-07 DIAGNOSIS — R2689 Other abnormalities of gait and mobility: Secondary | ICD-10-CM

## 2019-04-07 DIAGNOSIS — R296 Repeated falls: Secondary | ICD-10-CM

## 2019-04-07 DIAGNOSIS — M6281 Muscle weakness (generalized): Secondary | ICD-10-CM

## 2019-04-07 NOTE — Therapy (Signed)
[facility] [address] [place], [place], [address] Phone: [phone]   Fax:  [phone]  Physical Therapy Treatment  Patient Details  Name: [name] MRN: [id number] Date of Birth: 10/18/1960 Referring Provider (PT): Ezekiel Ina, MD   Encounter Date: 04/07/2019  PT End of Session - 04/07/19 0810    Visit Number  12    Number of Visits  15   eval plus 12 visits   Date for PT Re-Evaluation  04/19/19    Authorization Type  12 visits from 8/31 through 04/11/19    Authorization - Visit Number  11    Authorization - Number of Visits  12    PT Start Time  0800    PT Stop Time  0849    PT Time Calculation (min)  49 min    Equipment Utilized During Treatment  Gait belt    Activity Tolerance  Patient tolerated treatment well    Behavior During Therapy  [facility] for tasks assessed/performed       Past Medical History:  Diagnosis Date  . Arthritis   . 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 COPD (chronic obstructive pulmonary disease) ([place])   . GERD (gastroesophageal reflux disease)   . 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 History of gout   . History of kidney stones   . Hyperlipidemia   . Hypertension   . Prostate cancer [facility]) urologist-- dr Darene Lamer. [facility];  oncologist-  dr [name]   dx 07-20-2017  . Renal calculi   . Stroke ([place]) sept 26,  20006    hx mini strokes in past, 1 large stroke  . Weakness of left side of body 03/2005   cva residual  . Weakness of one side of body    L sided weakness after stroke    Past Surgical History:  Procedure Laterality Date  . COLONOSCOPY WITH PROPOFOL N/A 09/27/2015   Procedure: COLONOSCOPY WITH PROPOFOL;  Surgeon: [name], MD;  Location: [facility];  Service: Endoscopy;  Laterality: N/A;  . CYSTOSCOPY N/A 10/05/2012   Procedure:  CYSTOSCOPY, retrograde and right stent placement;  Surgeon: [name], MD;  Location: [facility];  Service: Urology;  Laterality: N/A;  . CYSTOSCOPY WITH RETROGRADE PYELOGRAM, URETEROSCOPY AND STENT PLACEMENT Right 04/13/2014   Procedure: CYSTOSCOPY WITH RETROGRADE PYELOGRAM, URETEROSCOPY AND STENT PLACEMENT, LASER ABLATION OF SCAR TISSUE;  Surgeon: [name], MD;  Location: [facility];  Service: Urology;  Laterality: Right;  . EYE SURGERY Left age 38  . HOLMIUM LASER APPLICATION Right 15/17/6160   Procedure: HOLMIUM LASER APPLICATION;  Surgeon: [name], MD;  Location: [facility];  Service: Urology;  Laterality: Right;  . NEPHROLITHOTOMY Right 10/05/2012   Procedure: NEPHROLITHOTOMY PERCUTANEOUS;  Surgeon: [name], MD;  Location: [facility];  Service: Urology;  Laterality: Right;  WITH CYSTO AND SURGEON ACCESS   . NEPHROLITHOTOMY Right 10/07/2012   Procedure: NEPHROLITHOTOMY PERCUTANEOUS SECOND LOOK. Stent exchange,Ureteroscopy ;  Surgeon: [name], MD;  Location: [facility];  Service: Urology;  Laterality: Right;  . RADIOACTIVE SEED IMPLANT N/A 11/12/2018   Procedure: RADIOACTIVE SEED IMPLANT/BRACHYTHERAPY IMPLANT;  Surgeon: [name], MD;  Location: [facility];  Service: Urology;  Laterality: N/A;  90 MINS  . SPACE OAR INSTILLATION N/A 11/12/2018   Procedure: SPACE OAR INSTILLATION;  Surgeon: [name], MD;  Location:  [facility];  Service: Urology;  Laterality: N/A;  . TOENAIL EXCISION      There were no vitals filed for this visit.  Subjective Assessment - 04/07/19 0802    Subjective  No falls, no new complaints. Arrives today in his power w/c (received transportation to today's appointment).    Pertinent History  R CVA (03/26/2005); blind in L eye and low vision in R eye; HLD, HTN, COPD, prostate cancer    Limitations  Walking;Standing    How long can you walk comfortably?  to his mailbox (about 50 ft)    Patient Stated Goals  wants to get his L leg working again    Currently in  Pain?  No/denies                       [facility] Adult PT Treatment/Exercise - 04/07/19 0001      Transfers   Transfers  Stand to Sit;Sit to Stand    Sit to Stand  5: Supervision    Sit to Stand Details  Verbal cues for technique;Verbal cues for sequencing    Sit to Stand Details (indicate cue type and reason)  from power w/c with [facility] - cues to use RLE to put foot plate up before coming to stand, pt initially demonstrating increased hip ABD and wide BOS to straddle foot plate and come to stand. Pt able to demonstrate correctly after therapist cues for technique, and pt demonstrating improved ease of transfer.      Ambulation/Gait   Ambulation/Gait  Yes    Ambulation/Gait Assistance  5: Supervision    Ambulation/Gait Assistance Details  Cues for heel > toe pattern throughout clinic today.    Ambulation Distance (Feet)  130 Feet    Assistive device  Straight cane;Other (Comment)   L AFO   Gait Pattern  Decreased stance time - left;Decreased arm swing - left;Decreased stride length;Decreased weight shift to left;Decreased dorsiflexion - left;Decreased hip/knee flexion - left;Trunk flexed;Poor foot clearance - left;Wide base of support;Left circumduction;Step-through pattern;Decreased step length - right;Left foot flat    Ambulation Surface  Level;Indoor      Wheelchair Mobility   Comments  Pt arrived today via transportation in order to bring his power w/c to today's session to assess positioning, pt demonstrates increased right lean in chair, increased hip ABD, increased posterior pelvic tilt in his w/c. Another therapist, Misty Stanley, PT, DPT present throughout session to assist in making adjustments to pt's chair - pt's seat height and arm rests height increased for improved positioning and weight distribution on buttock's and pt's LE. With adjustments, pt able to now hold LEs on foot place with less hip ABD and decreased weight shift towards R - with pt reporting improved comfort.  Thorough education provided on safety when using his w/c outside, making sure that pt goes slowly up and down his ramp to get out of his house (and leaning posterior when going downhill), wearing seatbelt while going up and down the ramp, and to let therapist know before/at next session any issues. Pt verbalized understanding. Pt performed w/c mobility with power w/c in and out of session today with mod I.       High Level Balance   High Level Balance Comments  In // bars: 2 x 10 reps standing marching with RUE support on bars, modified tandem stance static holds with no UE support 2 x 30 seconds B, side stepping on blue balance beam down and back 3 reps  with cues for increased hip/knee flexion and taking larger steps with LLE. On blue foam wide BOS progressing to narrow BOS with eyes closed, progressing to 30 second holds - performed in mirror for visual feedback before closing eyes to get balance in midline and shift towards LLE, manual facilitation from therapist to shift weight towards L. On rocker board in M/L position, massed practice weight shifting from middle > left, with increased holds towards L, mirror for visual feedback, min A from therapist to help facilitate L weight shift.                PT Short Term Goals - 03/15/19 0731      PT SHORT TERM GOAL #1   Title  Patient will be independent with initial HEP for balance and LE strenghtening in order to build upon functional gains in therapy. ALL STGS DUE BY 6TH VISIT.    Baseline  needs assistance getting into position for bridging, needs max cueing for quad sets.    Time  6    Period  --   6th visit   Status  Not Met      PT SHORT TERM GOAL #2   Title  Patient will decrease TUG score using single point cane to at least 53 seconds in order to decrease risk of falls.    Baseline  29.88 seconds on 03/15/19 with SPC and not using AFO    Status  Achieved      PT SHORT TERM GOAL #3   Title  Patient will ambulate 60' with  supervision with single point cane in order to improve mobility for household distances.    Baseline  supervision/min guard for 20' with use of SPC, no AFO    Status  Partially Met      PT SHORT TERM GOAL #4   Title  Patient will perform w/c mobility with power w/c outside over paved/cement surfaces for 250' with mod I in order to improve community mobility and safety.    Baseline  pt not able to bring in power w/c to session due to pt driving to and from appointments    Status  Not Met      PT SHORT TERM GOAL #5   Title  Patient will tolerate static standing balance x3 minutes at countertop with supervision in order to increase endurance for ADLs.    Baseline  met on 03/15/19 with single RUE support    Status  Achieved        PT Long Term Goals - 04/05/19 1219      PT LONG TERM GOAL #1   Title  Patient will be independent with initial HEP for balance and LE strengthening in order to build upon functional gains in therapy. ALL LTGs DUE 04/05/19    Baseline  on-going HEP for LE strength and standing balance    Time  2    Period  Weeks    Status  On-going      PT LONG TERM GOAL #2   Title  Patient will ambulate 82' with mod I with single point  cane and AFO in order to improve mobility for household distances.    Baseline  approx. 100' clinic distances today with SPC and without AFO donned with supervision on 04/05/19    Time  2    Period  Weeks    Status  Revised      PT LONG TERM GOAL #3   Title  Patient will tolerate static  standing balance x5 minutes at countertop with supervision in order to increase endurance for ADLs.    Baseline  able to stand at countertop for 6.5 minutes while performing dynamic and static standing balance with supervision on 04/05/19    Status  Achieved      PT LONG TERM GOAL #4   Title  Patient will decrease TUG score using single point cane and AFO to at least 25 seconds in order to decrease risk of falls.    Baseline  13.5 seconds on 04/05/19 with no L  AFO    Status  Achieved      PT LONG TERM GOAL #5   Title  Patient will decrease 5 times sit <> stand time from chair with single arm rest to 15.5 seconds or less in order to demo improved LE strength.    Baseline  17.89 seconds  from chair with single UE support on 04/05/19.    Time  2    Period  Weeks    Status  Revised      Additional Long Term Goals   Additional Long Term Goals  Yes      PT LONG TERM GOAL #6   Title  Patient will perform w/c mobility with power w/c outside over paved/cement surfaces and around obstacles for 500' with mod I in order to improve community mobility and safety.    Baseline  pt will be bringing in his power w/c at next session.    Time  2    Period  Weeks    Status  Deferred      PT LONG TERM GOAL #7   Title  Patient will perform stand pivot t/f from power w/c <> mat table using single point cane and mod I in order to improve safety at home.    Baseline  pt will be bringing in his power w/c at next session.    Time  2    Period  Weeks    Status  Deferred      PT LONG TERM GOAL #8   Title  Patient will improve baseline BERG score by at least 2 points in order to demo decreased fall risk.    Baseline  44/56 on 04/05/19    Time  2    Period  Weeks    Status  New            Plan - 04/07/19 1211    Clinical Impression Statement  Patient arrived today via transportation in his power w/c to bring to session for possible adjustments for improved positioning, posture, and weight bearing. In his chair, pt demonstrated increased hip abduction B, posterior pelvic tilt with increased pressure through buttocks, and increased trunk lean to R. Second therapist present throughout session - Misty Stanley, PT,DPT present to make adjustments to w/c with assist of user manual to improve positioning. Additional focus of today's session was primary therapist administering standing static and dynamic balance exercises on compliant surfaces in // bars. After rising seat  height and armrest height - pt demonstrated more upright posture in chair, decreased hip ABD, and less lateral trunk lean, pt also reporting improved comfort. Able to verbalize understanding of safety precautions after new adjustments - wearing seatbelt in chair, going slowly up/down inclines, and sitting back posteriorly up inclines. Will continue to progress towards LTGs.    Personal Factors and Comorbidities  Comorbidity 3+;Past/Current Experience;Time since onset of injury/illness/exacerbation    Comorbidities  HTN, HLD, prostate cancer, COPD, blind  in L eye. OA and gout.    Examination-Activity Limitations  Locomotion Level;Stand;Transfers    Stability/Clinical Decision Making  Evolving/Moderate complexity    Rehab Potential  Fair    PT Frequency  2x / week    PT Duration  2 weeks    PT Treatment/Interventions  ADLs/Self Care Home Management;DME Instruction;Gait training;Functional mobility training;Neuromuscular re-education;Balance training;Therapeutic exercise;Therapeutic activities;Patient/family education;Wheelchair mobility training;Passive range of motion;Orthotic Fit/Training;Energy conservation    PT Next Visit Plan  how were w/c adjustments? Start finalizing HEP. Gait training outdoors / around chairs/obstacles in gym. Balance strategies on compliant surfaces.    PT Home Exercise Plan  [id number]    Consulted and Agree with Plan of Care  Patient       Patient will benefit from skilled therapeutic intervention in order to improve the following deficits and impairments:  Abnormal gait, Decreased activity tolerance, Decreased balance, Decreased endurance, Decreased coordination, Decreased mobility, Decreased range of motion, Difficulty walking, Decreased strength, Impaired tone, Postural dysfunction  Visit Diagnosis: Other symptoms and signs involving the nervous system  Unsteadiness on feet  Abnormal posture  Muscle weakness (generalized)  Difficulty in walking, not elsewhere  classified  Other abnormalities of gait and mobility  Repeated falls     Problem List Patient Active Problem List   Diagnosis Date Noted  . Malignant neoplasm of prostate ([place]) 08/19/2018    Arliss Journey, PT, DPT  04/07/2019, 12:42 PM  [facility] [address] [place], [place], [address] Phone: [phone]   Fax:  [phone]  Name: [name] MRN: [id number] Date of Birth: 1960/09/04

## 2019-04-09 ENCOUNTER — Ambulatory Visit: Payer: Medicaid Other | Admitting: Podiatry

## 2019-04-09 ENCOUNTER — Encounter: Payer: Self-pay | Admitting: Podiatry

## 2019-04-09 ENCOUNTER — Other Ambulatory Visit: Payer: Self-pay

## 2019-04-09 DIAGNOSIS — M79675 Pain in left toe(s): Secondary | ICD-10-CM

## 2019-04-09 DIAGNOSIS — B351 Tinea unguium: Secondary | ICD-10-CM | POA: Diagnosis not present

## 2019-04-09 DIAGNOSIS — M79674 Pain in right toe(s): Secondary | ICD-10-CM

## 2019-04-09 NOTE — Patient Instructions (Signed)
Diabetes Mellitus and Foot Care Foot care is an important part of your health, especially when you have diabetes. Diabetes may cause you to have problems because of poor blood flow (circulation) to your feet and legs, which can cause your skin to:  Become thinner and drier.  Break more easily.  Heal more slowly.  Peel and crack. You may also have nerve damage (neuropathy) in your legs and feet, causing decreased feeling in them. This means that you may not notice minor injuries to your feet that could lead to more serious problems. Noticing and addressing any potential problems early is the best way to prevent future foot problems. How to care for your feet Foot hygiene  Wash your feet daily with warm water and mild soap. Do not use hot water. Then, pat your feet and the areas between your toes until they are completely dry. Do not soak your feet as this can dry your skin.  Trim your toenails straight across. Do not dig under them or around the cuticle. File the edges of your nails with an emery board or nail file.  Apply a moisturizing lotion or petroleum jelly to the skin on your feet and to dry, brittle toenails. Use lotion that does not contain alcohol and is unscented. Do not apply lotion between your toes. Shoes and socks  Wear clean socks or stockings every day. Make sure they are not too tight. Do not wear knee-high stockings since they may decrease blood flow to your legs.  Wear shoes that fit properly and have enough cushioning. Always look in your shoes before you put them on to be sure there are no objects inside.  To break in new shoes, wear them for just a few hours a day. This prevents injuries on your feet. Wounds, scrapes, corns, and calluses  Check your feet daily for blisters, cuts, bruises, sores, and redness. If you cannot see the bottom of your feet, use a mirror or ask someone for help.  Do not cut corns or calluses or try to remove them with medicine.  If you  find a minor scrape, cut, or break in the skin on your feet, keep it and the skin around it clean and dry. You may clean these areas with mild soap and water. Do not clean the area with peroxide, alcohol, or iodine.  If you have a wound, scrape, corn, or callus on your foot, look at it several times a day to make sure it is healing and not infected. Check for: ? Redness, swelling, or pain. ? Fluid or blood. ? Warmth. ? Pus or a bad smell. General instructions  Do not cross your legs. This may decrease blood flow to your feet.  Do not use heating pads or hot water bottles on your feet. They may burn your skin. If you have lost feeling in your feet or legs, you may not know this is happening until it is too late.  Protect your feet from hot and cold by wearing shoes, such as at the beach or on hot pavement.  Schedule a complete foot exam at least once a year (annually) or more often if you have foot problems. If you have foot problems, report any cuts, sores, or bruises to your health care provider immediately. Contact a health care provider if:  You have a medical condition that increases your risk of infection and you have any cuts, sores, or bruises on your feet.  You have an injury that is not   healing.  You have redness on your legs or feet.  You feel burning or tingling in your legs or feet.  You have pain or cramps in your legs and feet.  Your legs or feet are numb.  Your feet always feel cold.  You have pain around a toenail. Get help right away if:  You have a wound, scrape, corn, or callus on your foot and: ? You have pain, swelling, or redness that gets worse. ? You have fluid or blood coming from the wound, scrape, corn, or callus. ? Your wound, scrape, corn, or callus feels warm to the touch. ? You have pus or a bad smell coming from the wound, scrape, corn, or callus. ? You have a fever. ? You have a red line going up your leg. Summary  Check your feet every day  for cuts, sores, red spots, swelling, and blisters.  Moisturize feet and legs daily.  Wear shoes that fit properly and have enough cushioning.  If you have foot problems, report any cuts, sores, or bruises to your health care provider immediately.  Schedule a complete foot exam at least once a year (annually) or more often if you have foot problems. This information is not intended to replace advice given to you by your health care provider. Make sure you discuss any questions you have with your health care provider. Document Released: 06/14/2000 Document Revised: 07/30/2017 Document Reviewed: 07/19/2016 Elsevier Patient Education  2020 [employer]   Onychomycosis/Fungal Toenails  WHAT IS IT? An infection that lies within the keratin of your nail plate that is caused by a fungus.  WHY ME? Fungal infections affect all ages, sexes, races, and creeds.  There may be many factors that predispose you to a fungal infection such as age, coexisting medical conditions such as diabetes, or an autoimmune disease; stress, medications, fatigue, genetics, etc.  Bottom line: fungus thrives in a warm, moist environment and your shoes offer such a location.  IS IT CONTAGIOUS? Theoretically, yes.  You do not want to share shoes, nail clippers or files with someone who has fungal toenails.  Walking around barefoot in the same room or sleeping in the same bed is unlikely to transfer the organism.  It is important to realize, however, that fungus can spread easily from one nail to the next on the same foot.  HOW DO WE TREAT THIS?  There are several ways to treat this condition.  Treatment may depend on many factors such as age, medications, pregnancy, liver and kidney conditions, etc.  It is best to ask your doctor which options are available to you.  1. No treatment.   Unlike many other medical concerns, you can live with this condition.  However for many people this can be a painful condition and may lead to  ingrown toenails or a bacterial infection.  It is recommended that you keep the nails cut short to help reduce the amount of fungal nail. 2. Topical treatment.  These range from herbal remedies to prescription strength nail lacquers.  About 40-50% effective, topicals require twice daily application for approximately 9 to 12 months or until an entirely new nail has grown out.  The most effective topicals are medical grade medications available through physicians offices. 3. Oral antifungal medications.  With an 80-90% cure rate, the most common oral medication requires 3 to 4 months of therapy and stays in your system for a year as the new nail grows out.  Oral antifungal medications do require   blood work to make sure it is a safe drug for you.  A liver function panel will be performed prior to starting the medication and after the first month of treatment.  It is important to have the blood work performed to avoid any harmful side effects.  In general, this medication safe but blood work is required. 4. Laser Therapy.  This treatment is performed by applying a specialized laser to the affected nail plate.  This therapy is noninvasive, fast, and non-painful.  It is not covered by insurance and is therefore, out of pocket.  The results have been very good with a 80-95% cure rate.  [facility] is the only practice in the area to offer this therapy. 5. Permanent Nail Avulsion.  Removing the entire nail so that a new nail will not grow back. 

## 2019-04-09 NOTE — Progress Notes (Signed)
Subjective: [name] is seen today for follow up painful, elongated, thickened toenails 3-5 b/l feet that he cannot cut. Pain interferes with daily activities. Aggravating factor includes wearing enclosed shoe gear and relieved with periodic debridement.  Patient also c/o painful lesion plantar aspect of his right foot which interferes with daily activities. Condition is chronic and is aggravated when weightbearing with and without shoe gear. He states he has attempted to trim himself, but it is still painful.  Current Outpatient Medications on File Prior to Visit  Medication Sig  . albuterol (PROVENTIL HFA;VENTOLIN HFA) 108 (90 BASE) MCG/ACT inhaler Inhale 1-2 puffs into the lungs every 6 (six) hours as needed for wheezing or shortness of breath. Reported on 07/31/2015  . allopurinol (ZYLOPRIM) 300 MG tablet Take 300 mg by mouth every morning.   [name] aspirin EC 81 MG tablet Take 81 mg by mouth every morning.   . baclofen (LIORESAL) 10 MG tablet Take 10 mg by mouth 3 (three) times daily.   . budesonide-formoterol (SYMBICORT) 160-4.5 MCG/ACT inhaler Inhale 2 puffs into the lungs 2 (two) times daily.  . cephALEXin (KEFLEX) 500 MG capsule Take 500 mg by mouth 2 (two) times daily.  . cetirizine (ZYRTEC) 10 MG tablet Take 10 mg by mouth every morning. Reported on 07/31/2015  . chlorhexidine (PERIDEX) 0.12 % solution RINSE WITH 15ML FOR 30 SECONDS AND SPIT USE TWICE DAILY IN THE MORNING AND AFTERNOON AFTER BRUSHING. DO NOT SWALLOW  . diclofenac sodium (VOLTAREN) 1 % GEL Apply 1 application topically 3 (three) times daily as needed (pain).  . furosemide (LASIX) 20 MG tablet Take 20 mg by mouth daily at 10 pm. Reported on 07/31/2015  . HYDROcodone-acetaminophen (NORCO/VICODIN) 5-325 MG tablet Take 1-2 tablets by mouth every 6 (six) hours as needed for moderate pain or severe pain. (Patient not taking: Reported on 04/02/2019)  . hydrOXYzine (ATARAX/VISTARIL) 25 MG tablet Take 25 mg by mouth 3 (three) times  daily as needed.  [name] losartan-hydrochlorothiazide (HYZAAR) 100-12.5 MG tablet Take 1 tablet by mouth daily.  . Multiple Vitamin (MULTIVITAMIN WITH MINERALS) TABS tablet Take 1 tablet by mouth every morning.  . pantoprazole (PROTONIX) 40 MG tablet Take 40 mg by mouth daily.  . potassium citrate (UROCIT-K) 10 MEQ (1080 MG) SR tablet Take 10 mEq by mouth 2 (two) times daily at 10 AM and 5 PM. Reported on 07/31/2015  . predniSONE (DELTASONE) 20 MG tablet TAKE 2 TABLETS BY MOUTH ONCE DAILY FOR 3 DAYS THEN TAKE 1 DAILY FOR 4 DAYS  . simvastatin (ZOCOR) 20 MG tablet Take 20 mg by mouth at bedtime.  . traMADol (ULTRAM) 50 MG tablet Take 50 mg by mouth every 6 (six) hours as needed.  . traZODone (DESYREL) 50 MG tablet Take 50 mg by mouth at bedtime.   No current facility-administered medications on file prior to visit.      No Known Allergies   Objective:  Vascular Examination: Capillary refill time <3 seconds x 10 digits.  Dorsalis pedis present b/l.  Posterior tibial pulses present b/l.  Digital hair absent bl.  Skin temperature gradient WNL b/l.   Dermatological Examination: Skin with normal turgor, texture and tone b/l.  Toenails 3-5 b/l discolored, thick, dystrophic with subungual debris and pain with palpation to nailbeds due to thickness of nails.  Anonychia b/l great toes and b/l 2nd digits with evidence of permanent total nail avulsion. Nailbed(s) completely epithelialized and intact.  Porokeratotic lesions sub 5th metatarsal base with tenderness to palpation. No erythema,  no edema, no drainage, no flocculence.  Hyperkeratotic lesions submet head 5 b/l and b/l hallux with tenderness to palpation. No edema, no erythema, no drainage, no flocculence.  Musculoskeletal: Muscle strength 5/5 to all LE muscle groups.  No gross bony deformities b/l.  No pain, crepitus or joint limitation noted with ROM.   Neurological Examination: Protective sensation intact with 10 gram  monofilament bilaterally.  Epicritic sensation present bilaterally.  Vibratory sensation intact bilaterally.   Assessment: Painful onychomycosis toenails 3-5 b/l  Porokeratosis sub 5th met base right foot Calluses submet head 5 b/l and b/l hallux   Plan: 1. Toenails 3-5 b/l were debrided in length and girth without iatrogenic bleeding. 2. Porokeratosis sub 5th met base pared and enucleated with sterile scalpel blade without incident. Calluses pared submetatarsal head(s) 5th b/l and b/l hallux utilizing sterile scalpel blade without incident. 3. Patient to continue soft, supportive shoe gear. 4. Patient to report any pedal injuries to medical professional immediately. 5. Follow up 9 weeks. 6. Patient/POA to call should there be a concern in the interim.

## 2019-04-12 ENCOUNTER — Other Ambulatory Visit: Payer: Self-pay

## 2019-04-12 ENCOUNTER — Ambulatory Visit: Payer: Medicaid Other | Admitting: Physical Therapy

## 2019-04-12 ENCOUNTER — Encounter: Payer: Self-pay | Admitting: Physical Therapy

## 2019-04-12 DIAGNOSIS — R293 Abnormal posture: Secondary | ICD-10-CM

## 2019-04-12 DIAGNOSIS — R2681 Unsteadiness on feet: Secondary | ICD-10-CM

## 2019-04-12 DIAGNOSIS — R29818 Other symptoms and signs involving the nervous system: Secondary | ICD-10-CM | POA: Diagnosis not present

## 2019-04-12 DIAGNOSIS — R2689 Other abnormalities of gait and mobility: Secondary | ICD-10-CM

## 2019-04-12 DIAGNOSIS — M6281 Muscle weakness (generalized): Secondary | ICD-10-CM

## 2019-04-12 DIAGNOSIS — R262 Difficulty in walking, not elsewhere classified: Secondary | ICD-10-CM

## 2019-04-12 DIAGNOSIS — R296 Repeated falls: Secondary | ICD-10-CM

## 2019-04-12 NOTE — Therapy (Signed)
[facility] [address] [place], [place], [address] Phone: [phone]   Fax:  [phone]  Physical Therapy Treatment  Patient Details  Name: [name] MRN: [id number] Date of Birth: 1960-11-23 Referring Provider (PT): Ezekiel Ina, MD   Encounter Date: 04/12/2019  PT End of Session - 04/12/19 0727    Visit Number  13    Number of Visits  15   eval plus 12 visits   Date for PT Re-Evaluation  04/19/19    Authorization Type  Medicaid - 4 visits approved 04/12/19 - 04/25/19    Authorization - Visit Number  12    Authorization - Number of Visits  15    PT Start Time  0716    PT Stop Time  0757    PT Time Calculation (min)  41 min    Equipment Utilized During Treatment  Gait belt    Activity Tolerance  Patient tolerated treatment well    Behavior During Therapy  [facility] for tasks assessed/performed       Past Medical History:  Diagnosis Date  . Arthritis   . 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 COPD (chronic obstructive pulmonary disease) ([place])   . GERD (gastroesophageal reflux disease)   . 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 History of gout   . History of kidney stones   . Hyperlipidemia   . Hypertension   . Prostate cancer [facility]) urologist-- dr Darene Lamer. [facility];  oncologist-  dr [name]   dx 07-20-2017  . Renal calculi   . Stroke ([place]) sept 26,  20006    hx mini strokes in past, 1 large stroke  . Weakness of left side of body 03/2005   cva residual  . Weakness of one side of body    L sided weakness after stroke    Past Surgical History:  Procedure Laterality Date  . COLONOSCOPY WITH PROPOFOL N/A 09/27/2015   Procedure: COLONOSCOPY WITH PROPOFOL;  Surgeon: [name], MD;  Location: [facility];  Service: Endoscopy;  Laterality: N/A;  . CYSTOSCOPY N/A 10/05/2012   Procedure: CYSTOSCOPY, retrograde and right stent placement;  Surgeon: [name], MD;  Location: [facility];  Service: Urology;  Laterality: N/A;  . CYSTOSCOPY WITH RETROGRADE PYELOGRAM, URETEROSCOPY AND STENT PLACEMENT Right 04/13/2014   Procedure: CYSTOSCOPY WITH RETROGRADE PYELOGRAM, URETEROSCOPY AND STENT PLACEMENT, LASER ABLATION OF SCAR TISSUE;  Surgeon: [name], MD;  Location: [facility];  Service: Urology;  Laterality: Right;  . EYE SURGERY Left age 74  . HOLMIUM LASER APPLICATION Right 93/79/0240   Procedure: HOLMIUM LASER APPLICATION;  Surgeon: [name], MD;  Location: [facility];  Service: Urology;  Laterality: Right;  . NEPHROLITHOTOMY Right 10/05/2012   Procedure: NEPHROLITHOTOMY PERCUTANEOUS;  Surgeon: [name], MD;  Location: [facility];  Service: Urology;  Laterality: Right;  WITH CYSTO AND SURGEON ACCESS   . NEPHROLITHOTOMY Right 10/07/2012   Procedure: NEPHROLITHOTOMY PERCUTANEOUS SECOND LOOK. Stent exchange,Ureteroscopy ;  Surgeon: [name], MD;  Location: [facility];  Service: Urology;  Laterality: Right;  . RADIOACTIVE SEED IMPLANT N/A 11/12/2018   Procedure: RADIOACTIVE SEED IMPLANT/BRACHYTHERAPY IMPLANT;  Surgeon: [name], MD;  Location: [facility];  Service: Urology;  Laterality: N/A;  90 MINS  . SPACE OAR INSTILLATION N/A 11/12/2018   Procedure: SPACE OAR INSTILLATION;  Surgeon: [name], MD;  Location: [facility];  Service: Urology;  Laterality: N/A;  . TOENAIL EXCISION      There were no vitals filed for this visit.  Subjective Assessment - 04/12/19 0719    Subjective  No falls. States that his w/c feels a lot better after it was adjusted last session. No new complaints. Feeling stiff this morning.    Pertinent History  R CVA (03/26/2005); blind in L eye and low vision in R eye; HLD, HTN, COPD, prostate cancer    Limitations  Walking;Standing    How long can you walk comfortably?  to his mailbox (about 50 ft)    Patient Stated Goals  wants to get his L leg  working again    Currently in Pain?  No/denies                       [facility] Adult PT Treatment/Exercise - 04/12/19 0726      Ambulation/Gait   Ambulation/Gait  Yes    Ambulation/Gait Assistance  5: Supervision    Ambulation/Gait Assistance Details  Cues for heel > toe pattern.    Ambulation Distance (Feet)  115 Feet    Assistive device  Straight cane;Other (Comment)    L AFO   Gait Pattern  Decreased stance time - left;Decreased arm swing - left;Decreased stride length;Decreased weight shift to left;Decreased dorsiflexion - left;Decreased hip/knee flexion - left;Trunk flexed;Poor foot clearance - left;Wide base of support;Left circumduction;Step-through pattern;Decreased step length - right;Left foot flat    Ambulation Surface  Level;Indoor      Neuro Re-ed    Neuro Re-ed Details   In // bars on blue balance foam beam: 1 x 10 reps anterior stepping strategy B with intermittent UE support. With feet together eyes closed 2 x 30 seconds, wide BOS with eyes closed 2 x 5 reps vertical head nods, 2 x 5 reps horizontal head turns - pt with tendency to go towards R when eyes are closed.  Attempted stepping over long black foam with LLE at smallest height, discontinued due to increased L circumduction vs. hip flexion. With 6" step, practiced SLS on LLE and tapping RLE onto step 2 x 5 reps progressing to no UE support. 1 x 10 reps mini squats with no UE support, use of mirror for visual feedback for weight shifting to L, progressed to 1 x 10 reps mini squats on blue foam, manual facilitation from therapist for weight shifting. 1 x 10 reps on M/L rocker board weight shifting towards L with no UE support.       Exercises   Other Exercises   SciFit with LEs/right UE level 3.0 for 5 minutes with goal >/=40 rpm for strengthening and activity tolerance.               PT Short Term Goals - 03/15/19 0731      PT SHORT TERM GOAL #1   Title  Patient will be independent with initial HEP  for balance and LE strenghtening in order to build upon functional gains in therapy. ALL STGS DUE BY 6TH VISIT.    Baseline  needs assistance getting into position for bridging, needs max cueing for quad sets.    Time  6    Period  --   6th visit   Status  Not Met      PT SHORT TERM GOAL #2   Title  Patient will decrease TUG score using single point cane to at least 53 seconds in order  to decrease risk of falls.    Baseline  29.88 seconds on 03/15/19 with SPC and not using AFO    Status  Achieved      PT SHORT TERM GOAL #3   Title  Patient will ambulate 58' with supervision with single point cane in order to improve mobility for household distances.    Baseline  supervision/min guard for 20' with use of SPC, no AFO    Status  Partially Met      PT SHORT TERM GOAL #4   Title  Patient will perform w/c mobility with power w/c outside over paved/cement surfaces for 250' with mod I in order to improve community mobility and safety.    Baseline  pt not able to bring in power w/c to session due to pt driving to and from appointments    Status  Not Met      PT SHORT TERM GOAL #5   Title  Patient will tolerate static standing balance x3 minutes at countertop with supervision in order to increase endurance for ADLs.    Baseline  met on 03/15/19 with single RUE support    Status  Achieved        PT Long Term Goals - 04/05/19 1219      PT LONG TERM GOAL #1   Title  Patient will be independent with initial HEP for balance and LE strengthening in order to build upon functional gains in therapy. ALL LTGs DUE 04/05/19    Baseline  on-going HEP for LE strength and standing balance    Time  2    Period  Weeks    Status  On-going      PT LONG TERM GOAL #2   Title  Patient will ambulate 36' with mod I with single point  cane and AFO in order to improve mobility for household distances.    Baseline  approx. 85' clinic distances today with SPC and without AFO donned with supervision on 04/05/19     Time  2    Period  Weeks    Status  Revised      PT LONG TERM GOAL #3   Title  Patient will tolerate static standing balance x5 minutes at countertop with supervision in order to increase endurance for ADLs.    Baseline  able to stand at countertop for 6.5 minutes while performing dynamic and static standing balance with supervision on 04/05/19    Status  Achieved      PT LONG TERM GOAL #4   Title  Patient will decrease TUG score using single point cane and AFO to at least 25 seconds in order to decrease risk of falls.    Baseline  13.5 seconds on 04/05/19 with no L AFO    Status  Achieved      PT LONG TERM GOAL #5   Title  Patient will decrease 5 times sit <> stand time from chair with single arm rest to 15.5 seconds or less in order to demo improved LE strength.    Baseline  17.89 seconds  from chair with single UE support on 04/05/19.    Time  2    Period  Weeks    Status  Revised      Additional Long Term Goals   Additional Long Term Goals  Yes      PT LONG TERM GOAL #6   Title  Patient will perform w/c mobility with power w/c outside over paved/cement surfaces and around obstacles for  500' with mod I in order to improve community mobility and safety.    Baseline  pt will be bringing in his power w/c at next session.    Time  2    Period  Weeks    Status  Deferred      PT LONG TERM GOAL #7   Title  Patient will perform stand pivot t/f from power w/c <> mat table using single point cane and mod I in order to improve safety at home.    Baseline  pt will be bringing in his power w/c at next session.    Time  2    Period  Weeks    Status  Deferred      PT LONG TERM GOAL #8   Title  Patient will improve baseline BERG score by at least 2 points in order to demo decreased fall risk.    Baseline  44/56 on 04/05/19    Time  2    Period  Weeks    Status  New            Plan - 04/12/19 1203    Clinical Impression Statement  Focus of today's session was LE strengthening and  ROM, balance strategies on compliant surfaces. With eyes closed on foam, pt with tendency to lean towards R side, before pt would close eyes would use mirror as visual feedback for midline orientation before closing his eyes. At beginning of session, pt reporting increased stiffness - at end pt reported feeling a lot better after activity. At next session will plan to finalize HEP for upcoming D/C. Will continue to progress towards LTGs.    Personal Factors and Comorbidities  Comorbidity 3+;Past/Current Experience;Time since onset of injury/illness/exacerbation    Comorbidities  HTN, HLD, prostate cancer, COPD, blind in L eye. OA and gout.    Examination-Activity Limitations  Locomotion Level;Stand;Transfers    Stability/Clinical Decision Making  Evolving/Moderate complexity    Rehab Potential  Fair    PT Frequency  2x / week    PT Duration  2 weeks    PT Treatment/Interventions  ADLs/Self Care Home Management;DME Instruction;Gait training;Functional mobility training;Neuromuscular re-education;Balance training;Therapeutic exercise;Therapeutic activities;Patient/family education;Wheelchair mobility training;Passive range of motion;Orthotic Fit/Training;Energy conservation    PT Next Visit Plan  finalizing [place] and Agree with Plan of Care  Patient       Patient will benefit from skilled therapeutic intervention in order to improve the following deficits and impairments:  Abnormal gait, Decreased activity tolerance, Decreased balance, Decreased endurance, Decreased coordination, Decreased mobility, Decreased range of motion, Difficulty walking, Decreased strength, Impaired tone, Postural dysfunction  Visit Diagnosis: Other symptoms and signs involving the nervous system  Unsteadiness on feet  Abnormal posture  Muscle weakness (generalized)  Difficulty in walking, not elsewhere classified  Other abnormalities of gait and mobility  Repeated  falls     Problem List Patient Active Problem List   Diagnosis Date Noted  . Malignant neoplasm of prostate ([place]) 08/19/2018    Arliss Journey, PT, DPT  04/12/2019, 12:05 PM  [facility] [address] [place], [place], [address] Phone: [phone]   Fax:  [phone]  Name: [name] MRN: [id number] Date of Birth: Nov 08, 1960

## 2019-04-14 ENCOUNTER — Encounter: Payer: Self-pay | Admitting: Physical Therapy

## 2019-04-14 ENCOUNTER — Ambulatory Visit: Payer: Medicaid Other | Admitting: Physical Therapy

## 2019-04-14 ENCOUNTER — Other Ambulatory Visit: Payer: Self-pay

## 2019-04-14 DIAGNOSIS — R2681 Unsteadiness on feet: Secondary | ICD-10-CM

## 2019-04-14 DIAGNOSIS — R29818 Other symptoms and signs involving the nervous system: Secondary | ICD-10-CM | POA: Diagnosis not present

## 2019-04-14 DIAGNOSIS — R2689 Other abnormalities of gait and mobility: Secondary | ICD-10-CM

## 2019-04-14 DIAGNOSIS — R293 Abnormal posture: Secondary | ICD-10-CM

## 2019-04-14 DIAGNOSIS — R262 Difficulty in walking, not elsewhere classified: Secondary | ICD-10-CM

## 2019-04-14 NOTE — Patient Instructions (Signed)
Access Code: [email or web address]  URL: [email or web address]  Date: 04/14/2019  Prepared by: Janann August   Exercises Supine Bridge - 5 reps - 3 sets - 1x daily - 5x weekly Seated Hip Adduction Isometrics with Ball - 5 reps - 3 sets - 2x daily - 5x weekly Bent Knee Fallouts - 5 reps - 3 sets - 2x daily - 7x weekly Supine Quad Set - 10 reps - 2 sets - 2x daily - 7x weekly Standing Weight Shift Side to Side - 10 reps - 2 sets - 2x daily - 7x weekly Staggered Stance Forward Backward Weight Shift with Counter Support - 10 reps - 3 sets - 1x daily - 7x weekly Sit to Stand with Counter Support - 5 reps - 2 sets - 2x daily - 7x weekly Side Stepping with Counter Support - 4 sets - 7x weekly March in Place - 10 reps - 2 sets - 1x daily - 7x weekly Standing Knee Flexion AROM with Chair Support - 5 reps - 3 sets - 1x daily - 7x weekly Seated Hamstring Stretch - 3 sets - 20 hold - 1x daily - 7x weekly

## 2019-04-14 NOTE — Therapy (Signed)
[facility] [address] [place], [place], [address] Phone: [phone]   Fax:  [phone]  Physical Therapy Treatment  Patient Details  Name: [name] MRN: [id number] Date of Birth: 1960/12/12 Referring Provider (PT): Ezekiel Ina, MD   Encounter Date: 04/14/2019  PT End of Session - 04/14/19 1207    Visit Number  14    Number of Visits  15   eval plus 12 visits   Date for PT Re-Evaluation  04/19/19    Authorization Type  Medicaid - 4 visits approved 04/12/19 - 04/25/19    Authorization - Visit Number  12    Authorization - Number of Visits  15    PT Start Time  0801    PT Stop Time  0842    PT Time Calculation (min)  41 min    Equipment Utilized During Treatment  Gait belt    Activity Tolerance  Patient tolerated treatment well    Behavior During Therapy  [facility] for tasks assessed/performed       Past Medical History:  Diagnosis Date  . Arthritis   . 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 COPD (chronic obstructive pulmonary disease) ([place])   . GERD (gastroesophageal reflux disease)   . 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 History of gout   . History of kidney stones   . Hyperlipidemia   . Hypertension   . Prostate cancer [facility]) urologist-- dr Darene Lamer. [facility];  oncologist-  dr [name]   dx 07-20-2017  . Renal calculi   . Stroke ([place]) sept 26,  20006    hx mini strokes in past, 1 large stroke  . Weakness of left side of body 03/2005   cva residual  . Weakness of one side of body    L sided weakness after stroke    Past Surgical History:  Procedure Laterality Date  . COLONOSCOPY WITH PROPOFOL N/A 09/27/2015   Procedure: COLONOSCOPY WITH PROPOFOL;  Surgeon: [name], MD;  Location: [facility];  Service: Endoscopy;  Laterality: N/A;  . CYSTOSCOPY N/A 10/05/2012    Procedure: CYSTOSCOPY, retrograde and right stent placement;  Surgeon: [name], MD;  Location: [facility];  Service: Urology;  Laterality: N/A;  . CYSTOSCOPY WITH RETROGRADE PYELOGRAM, URETEROSCOPY AND STENT PLACEMENT Right 04/13/2014   Procedure: CYSTOSCOPY WITH RETROGRADE PYELOGRAM, URETEROSCOPY AND STENT PLACEMENT, LASER ABLATION OF SCAR TISSUE;  Surgeon: [name], MD;  Location: [facility];  Service: Urology;  Laterality: Right;  . EYE SURGERY Left age 82  . HOLMIUM LASER APPLICATION Right 69/62/9528   Procedure: HOLMIUM LASER APPLICATION;  Surgeon: [name], MD;  Location: [facility];  Service: Urology;  Laterality: Right;  . NEPHROLITHOTOMY Right 10/05/2012   Procedure: NEPHROLITHOTOMY PERCUTANEOUS;  Surgeon: [name], MD;  Location: [facility];  Service: Urology;  Laterality: Right;  WITH CYSTO AND SURGEON ACCESS   . NEPHROLITHOTOMY Right 10/07/2012   Procedure: NEPHROLITHOTOMY PERCUTANEOUS SECOND LOOK. Stent exchange,Ureteroscopy ;  Surgeon: [name], MD;  Location: [facility];  Service: Urology;  Laterality: Right;  . RADIOACTIVE SEED IMPLANT N/A 11/12/2018   Procedure: RADIOACTIVE SEED IMPLANT/BRACHYTHERAPY IMPLANT;  Surgeon: [name], MD;  Location: [facility];  Service: Urology;  Laterality: N/A;  90 MINS  . SPACE OAR INSTILLATION N/A 11/12/2018   Procedure: SPACE OAR INSTILLATION;  Surgeon: [name], MD;  Location: [facility];  Service: Urology;  Laterality: N/A;  . TOENAIL EXCISION      There were no vitals filed for this visit.  Subjective Assessment - 04/14/19 0804    Subjective  No falls. Feeling stiff this morning.    Pertinent History  R CVA (03/26/2005); blind in L eye and low vision in R eye; HLD, HTN, COPD, prostate cancer    Limitations  Walking;Standing    How long can you walk comfortably?  to his mailbox (about 50 ft)    Patient Stated Goals  wants to get his L leg working again    Currently in Pain?  No/denies                        [facility] Adult PT Treatment/Exercise - 04/14/19 0810      Transfers   Transfers  Sit to Stand;Stand to Sit    Sit to Stand  5: Supervision    Sit to Stand Details  Verbal cues for sequencing;Verbal cues for technique    Sit to Stand Details (indicate cue type and reason)  from arm chair with RUE support 3 x 5 reps with glute squeeze at the top.       Ambulation/Gait   Ambulation/Gait  Yes    Ambulation/Gait Assistance  5: Supervision    Ambulation/Gait Assistance Details  initial cueing for [facility]  sequencing    Ambulation Distance (Feet)  130 Feet    Assistive device  Straight cane;Other (Comment)   L AFO   Gait Pattern  Decreased stance time - left;Decreased arm swing - left;Decreased stride length;Decreased weight shift to left;Decreased dorsiflexion - left;Decreased hip/knee flexion - left;Trunk flexed;Poor foot clearance - left;Wide base of support;Left circumduction;Step-through pattern;Decreased step length - right;Left foot flat    Ambulation Surface  Level;Indoor      Exercises   Other Exercises   SciFit with LEs/right UE level 2.5 for 6 minutes with goal >/=40 rpm for strengthening and activity tolerance.           Access Code: [email or web address]  URL: [email or web address]  Date: 04/14/2019   Prepared by: Janann August   Reviewed/upgraded HEP:   Exercises Supine Bridge - 5 reps - 3 sets - 1x daily - 5x weekly Seated Hip Adduction Isometrics with Ball - 5 reps - 3 sets - 2x daily - 5x weekly Bent Knee Fallouts - 5 reps - 3 sets - 2x daily - 7x weekly Supine Quad Set - 10 reps - 2 sets - 2x daily - 7x weekly Standing Weight Shift Side to Side - 10 reps - 2 sets - 2x daily - 7x weekly Staggered Stance Forward Backward Weight Shift with Counter Support - 10 reps - 3 sets - 1x daily - 7x weekly Sit to Stand with Counter Support - 5 reps - 2 sets - 2x daily - 7x weekly  Side Stepping with Counter Support - 4 sets - 7x weekly March in  Place - 10 reps - 2 sets - 1x daily - 7x weekly Standing Knee Flexion AROM with Chair Support - 5 reps - 3 sets - 1x daily - 7x weekly - added to HEP  Seated Hamstring Stretch - 3 sets - 20 hold - 1x daily - 7x weekly      PT Short Term Goals - 03/15/19 0731      PT SHORT TERM GOAL #1   Title  Patient will be independent with initial HEP for balance and LE  strenghtening in order to build upon functional gains in therapy. ALL STGS DUE BY 6TH VISIT.    Baseline  needs assistance getting into position for bridging, needs max cueing for quad sets.    Time  6    Period  --   6th visit   Status  Not Met      PT SHORT TERM GOAL #2   Title  Patient will decrease TUG score using single point cane to at least 53 seconds in order to decrease risk of falls.    Baseline  29.88 seconds on 03/15/19 with SPC and not using AFO    Status  Achieved      PT SHORT TERM GOAL #3   Title  Patient will ambulate 72' with supervision with single point cane in order to improve mobility for household distances.    Baseline  supervision/min guard for 20' with use of SPC, no AFO    Status  Partially Met      PT SHORT TERM GOAL #4   Title  Patient will perform w/c mobility with power w/c outside over paved/cement surfaces for 250' with mod I in order to improve community mobility and safety.    Baseline  pt not able to bring in power w/c to session due to pt driving to and from appointments    Status  Not Met      PT SHORT TERM GOAL #5   Title  Patient will tolerate static standing balance x3 minutes at countertop with supervision in order to increase endurance for ADLs.    Baseline  met on 03/15/19 with single RUE support    Status  Achieved        PT Long Term Goals - 04/05/19 1219      PT LONG TERM GOAL #1   Title  Patient will be independent with initial HEP for balance and LE strengthening in order to build upon functional gains in therapy. ALL LTGs DUE 04/05/19    Baseline  on-going HEP for LE  strength and standing balance    Time  2    Period  Weeks    Status  On-going      PT LONG TERM GOAL #2   Title  Patient will ambulate 43' with mod I with single point  cane and AFO in order to improve mobility for household distances.    Baseline  approx. 64' clinic distances today with SPC and without AFO donned with supervision on 04/05/19    Time  2    Period  Weeks    Status  Revised      PT LONG TERM GOAL #3   Title  Patient will tolerate static standing balance x5 minutes at countertop with supervision in order to increase endurance for ADLs.    Baseline  able to stand at countertop for 6.5 minutes while performing dynamic and static standing balance with supervision on 04/05/19    Status  Achieved      PT LONG TERM GOAL #4   Title  Patient will decrease TUG score using single point cane and AFO to at least 25 seconds in order to decrease risk of falls.    Baseline  13.5 seconds on 04/05/19 with no L AFO    Status  Achieved      PT LONG TERM GOAL #5   Title  Patient will decrease 5 times sit <> stand time from chair with single arm rest to 15.5 seconds or less in order  to demo improved LE strength.    Baseline  17.89 seconds  from chair with single UE support on 04/05/19.    Time  2    Period  Weeks    Status  Revised      Additional Long Term Goals   Additional Long Term Goals  Yes      PT LONG TERM GOAL #6   Title  Patient will perform w/c mobility with power w/c outside over paved/cement surfaces and around obstacles for 500' with mod I in order to improve community mobility and safety.    Baseline  pt will be bringing in his power w/c at next session.    Time  2    Period  Weeks    Status  Deferred      PT LONG TERM GOAL #7   Title  Patient will perform stand pivot t/f from power w/c <> mat table using single point cane and mod I in order to improve safety at home.    Baseline  pt will be bringing in his power w/c at next session.    Time  2    Period  Weeks     Status  Deferred      PT LONG TERM GOAL #8   Title  Patient will improve baseline BERG score by at least 2 points in order to demo decreased fall risk.    Baseline  44/56 on 04/05/19    Time  2    Period  Weeks    Status  New            Plan - 04/14/19 1222    Clinical Impression Statement  Focus of today's session was reviewing/upgrading HEP. Pt reports he had not been doing his sit to stands at home, reviewed exercise today with using arm chair and R UE to push to stand, performed 3 x 5 reps (pt with increased fatigue after 5 reps and needing to rest). Reviewed standing weight shifting exercises as well, pt had not been performing at home. Therapist educating pt on importance of performing exercises daily at home outside of therapy in order to continue making progress. Added standing R knee flexion at countertop in order to improve SLS on LLE. Will futher review and next session and assess remainder of LTGs with anticipated D/C.    Personal Factors and Comorbidities  Comorbidity 3+;Past/Current Experience;Time since onset of injury/illness/exacerbation    Comorbidities  HTN, HLD, prostate cancer, COPD, blind in L eye. OA and gout.    Examination-Activity Limitations  Locomotion Level;Stand;Transfers    Stability/Clinical Decision Making  Evolving/Moderate complexity    Rehab Potential  Fair    PT Frequency  2x / week    PT Duration  2 weeks    PT Treatment/Interventions  ADLs/Self Care Home Management;DME Instruction;Gait training;Functional mobility training;Neuromuscular re-education;Balance training;Therapeutic exercise;Therapeutic activities;Patient/family education;Wheelchair mobility training;Passive range of motion;Orthotic Fit/Training;Energy conservation    PT Next Visit Plan  finalizing HEP, d/c. check any remainder LTGs.    PT Home Exercise Plan  [email or web address]    Consulted and Agree with Plan of Care  Patient       Patient will benefit from skilled therapeutic intervention in  order to improve the following deficits and impairments:  Abnormal gait, Decreased activity tolerance, Decreased balance, Decreased endurance, Decreased coordination, Decreased mobility, Decreased range of motion, Difficulty walking, Decreased strength, Impaired tone, Postural dysfunction  Visit Diagnosis: Other symptoms and signs involving the nervous system  Unsteadiness on feet  Abnormal  posture  Other abnormalities of gait and mobility  Difficulty in walking, not elsewhere classified     Problem List Patient Active Problem List   Diagnosis Date Noted  . Malignant neoplasm of prostate ([place]) 08/19/2018    Arliss Journey, PT, DPT  04/14/2019, 12:25 PM  [facility] [address] [place], [place], [address] Phone: [phone]   Fax:  [phone]  Name: [name] MRN: [id number] Date of Birth: [birth year]

## 2019-04-19 ENCOUNTER — Ambulatory Visit: Payer: Medicaid Other | Admitting: Physical Therapy

## 2019-04-19 ENCOUNTER — Other Ambulatory Visit: Payer: Self-pay

## 2019-04-19 ENCOUNTER — Encounter: Payer: Self-pay | Admitting: Physical Therapy

## 2019-04-19 DIAGNOSIS — R2681 Unsteadiness on feet: Secondary | ICD-10-CM

## 2019-04-19 DIAGNOSIS — R2689 Other abnormalities of gait and mobility: Secondary | ICD-10-CM

## 2019-04-19 DIAGNOSIS — R29818 Other symptoms and signs involving the nervous system: Secondary | ICD-10-CM | POA: Diagnosis not present

## 2019-04-19 DIAGNOSIS — R293 Abnormal posture: Secondary | ICD-10-CM

## 2019-04-19 DIAGNOSIS — R262 Difficulty in walking, not elsewhere classified: Secondary | ICD-10-CM

## 2019-04-19 DIAGNOSIS — M6281 Muscle weakness (generalized): Secondary | ICD-10-CM

## 2019-04-19 NOTE — Therapy (Signed)
[facility] [address] [place], [place], [address] Phone: [phone]   Fax:  [phone]  Physical Therapy Treatment/ D/C Summary  Patient Details  Name: [name] MRN: [id number] Date of Birth: 1960/08/17 Referring Provider (PT): Ezekiel Ina, MD   Encounter Date: 04/19/2019  PT End of Session - 04/19/19 0726    Visit Number  15    Number of Visits  15   eval plus 12 visits   Date for PT Re-Evaluation  04/19/19    Authorization Type  Medicaid - 4 visits approved 04/12/19 - 04/25/19    Authorization - Visit Number  15    Authorization - Number of Visits  15    PT Start Time  0717    PT Stop Time  0758    PT Time Calculation (min)  41 min    Equipment Utilized During Treatment  Gait belt    Activity Tolerance  Patient tolerated treatment well    Behavior During Therapy  [facility] for tasks assessed/performed       Past Medical History:  Diagnosis Date  . Arthritis   . 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 COPD (chronic obstructive pulmonary disease) ([place])   . GERD (gastroesophageal reflux disease)   . 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 History of gout   . History of kidney stones   . Hyperlipidemia   . Hypertension   . Prostate cancer [facility]) urologist-- dr Darene Lamer. Tresa Moore;  oncologist-  dr [name]   dx 07-20-2017  . Renal calculi   . Stroke ([place]) sept 26,  20006    hx mini strokes in past, 1 large stroke  . Weakness of left side of body 03/2005   cva residual  . Weakness of one side of body    L sided weakness after stroke    Past Surgical History:  Procedure Laterality Date  . COLONOSCOPY WITH PROPOFOL N/A 09/27/2015   Procedure: COLONOSCOPY WITH PROPOFOL;  Surgeon: [name], MD;  [facility]: [facility];  Service: Endoscopy;  Laterality: N/A;  . CYSTOSCOPY N/A  10/05/2012   Procedure: CYSTOSCOPY, retrograde and right stent placement;  Surgeon: [name], MD;  Location: [facility];  Service: Urology;  Laterality: N/A;  . CYSTOSCOPY WITH RETROGRADE PYELOGRAM, URETEROSCOPY AND STENT PLACEMENT Right 04/13/2014   Procedure: CYSTOSCOPY WITH RETROGRADE PYELOGRAM, URETEROSCOPY AND STENT PLACEMENT, LASER ABLATION OF SCAR TISSUE;  Surgeon: [name], MD;  [facility];  Service: Urology;  Laterality: Right;  . EYE SURGERY Left age 67  . HOLMIUM LASER APPLICATION Right 17/40/8144   Procedure: HOLMIUM LASER APPLICATION;  Surgeon: [name], MD;  [facility];  Service: Urology;  Laterality: Right;  . NEPHROLITHOTOMY Right 10/05/2012   Procedure: NEPHROLITHOTOMY PERCUTANEOUS;  Surgeon: [name], MD;  [facility];  Service: Urology;  Laterality: Right;  WITH CYSTO AND SURGEON ACCESS   . NEPHROLITHOTOMY Right 10/07/2012   Procedure: NEPHROLITHOTOMY PERCUTANEOUS SECOND LOOK. Stent exchange,Ureteroscopy ;  Surgeon: [name], MD;  [facility];  Service: Urology;  Laterality: Right;  . RADIOACTIVE SEED IMPLANT N/A 11/12/2018   Procedure: RADIOACTIVE SEED IMPLANT/BRACHYTHERAPY IMPLANT;  Surgeon: [name], MD;  [facility];  Service: Urology;  Laterality: N/A;  90 MINS  . SPACE OAR INSTILLATION N/A 11/12/2018   Procedure: SPACE OAR INSTILLATION;  Surgeon: Tresa Moore,  Hubbard Robinson, MD;  [facility];  Service: Urology;  Laterality: N/A;  . TOENAIL EXCISION      There were no vitals filed for this visit.  Subjective Assessment - 04/19/19 0719    Subjective  No falls. Still feeling stiff. Has been trying the exercises at home. States that his wheelchair is a lot more comfortable after it was adjusted. Feels a whole lot better since starting therapy.    Pertinent History  R CVA (03/26/2005); blind in L eye and low vision in R eye; HLD, HTN, COPD, prostate cancer    Limitations  Walking;Standing    How long can you walk comfortably?  to  his mailbox (about 50 ft)    Patient Stated Goals  wants to get his L leg working again    Currently in Pain?  No/denies         [facility] PT Assessment - 04/19/19 0726      Transfers   Transfers  Sit to Stand;Stand to Sit    Sit to Stand  5: Supervision    Five time sit to stand comments   15.47 seconds from chair with single UE support       Ambulation/Gait   Ambulation/Gait  Yes    Ambulation/Gait Assistance  6: Modified independent (Device/Increase time)    Ambulation Distance (Feet)  200 Feet   1 x 130, additional 70 feet clinic distances   Assistive device  Straight cane;Other (Comment)   L AFO   Gait Pattern  Decreased stance time - left;Decreased arm swing - left;Decreased stride length;Decreased weight shift to left;Decreased dorsiflexion - left;Decreased hip/knee flexion - left;Trunk flexed;Poor foot clearance - left;Wide base of support;Left circumduction;Step-through pattern;Decreased step length - right;Left foot flat    Ambulation Surface  Level;Indoor      Berg Balance Test   Sit to Stand  Able to stand without using hands and stabilize independently    Standing Unsupported  Able to stand safely 2 minutes    Sitting with Back Unsupported but Feet Supported on Floor or Stool  Able to sit safely and securely 2 minutes    Stand to Sit  Sits safely with minimal use of hands    Transfers  Able to transfer safely, minor use of hands    Standing Unsupported with Eyes Closed  Able to stand 10 seconds safely    Standing Unsupported with Feet Together  Able to place feet together independently and stand 1 minute safely    From Standing, Reach Forward with Outstretched Arm  Can reach confidently >25 cm (10")    From Standing Position, Pick up Object from Floor  Able to pick up shoe safely and easily    From Standing Position, Turn to Look Behind Over each Shoulder  Looks behind from both sides and weight shifts well    Turn 360 Degrees  Able to turn 360 degrees safely but slowly    5.44 to L, 5.94 to R   Standing Unsupported, Alternately Place Feet on Step/Stool  Able to complete >2 steps/needs minimal assist    Standing Unsupported, One Foot in Front  Able to plae foot ahead of the other independently and hold 30 seconds    Standing on One Leg  Able to lift leg independently and hold 5-10 seconds   standing on R   Total Score  49    Berg comment:  49/56  [facility] PT Treatment/Exercise - 04/19/19 0726      Transfers   Comments  from low blue mat table sit <> stands with 2" block under RLE - 1 x 5 reps with single RUE support, 1 x 5 reps no UE support. cues for eccentric control and glute squeeze at the top        High Level Balance   High Level Balance Comments  Standing with single RUE support: 2 x 10 reps hamstring curls               PT Short Term Goals - 03/15/19 0731      PT SHORT TERM GOAL #1   Title  Patient will be independent with initial HEP for balance and LE strenghtening in order to build upon functional gains in therapy. ALL STGS DUE BY 6TH VISIT.    Baseline  needs assistance getting into position for bridging, needs max cueing for quad sets.    Time  6    Period  --   6th visit   Status  Not Met      PT SHORT TERM GOAL #2   Title  Patient will decrease TUG score using single point cane to at least 53 seconds in order to decrease risk of falls.    Baseline  29.88 seconds on 03/15/19 with SPC and not using AFO    Status  Achieved      PT SHORT TERM GOAL #3   Title  Patient will ambulate 73' with supervision with single point cane in order to improve mobility for household distances.    Baseline  supervision/min guard for 20' with use of SPC, no AFO    Status  Partially Met      PT SHORT TERM GOAL #4   Title  Patient will perform w/c mobility with power w/c outside over paved/cement surfaces for 250' with mod I in order to improve community mobility and safety.    Baseline  pt not able to bring in  power w/c to session due to pt driving to and from appointments    Status  Not Met      PT SHORT TERM GOAL #5   Title  Patient will tolerate static standing balance x3 minutes at countertop with supervision in order to increase endurance for ADLs.    Baseline  met on 03/15/19 with single RUE support    Status  Achieved        PT Long Term Goals - 04/19/19 0737      PT LONG TERM GOAL #1   Title  Patient will be independent with initial HEP for balance and LE strengthening in order to build upon functional gains in therapy. ALL LTGs DUE 04/05/19    Baseline  verbally reviewed this session - finalized at last session.    Time  2    Period  Weeks    Status  Achieved      PT LONG TERM GOAL #2   Title  Patient will ambulate 67' with mod I with single point  cane and AFO in order to improve mobility for household distances.    Baseline  able to ambulate 200' throughout clinic tonight with SPC and AFO with mod I - no cueing for proper technique    Time  2    Period  Weeks    Status  Achieved      PT LONG TERM GOAL #3   Title  Patient will tolerate static standing  balance x5 minutes at countertop with supervision in order to increase endurance for ADLs.    Baseline  able to stand at countertop for 6.5 minutes while performing dynamic and static standing balance with supervision on 04/05/19    Status  Achieved      PT LONG TERM GOAL #4   Title  Patient will decrease TUG score using single point cane and AFO to at least 25 seconds in order to decrease risk of falls.    Baseline  13.5 seconds on 04/05/19 with no L AFO    Status  Achieved      PT LONG TERM GOAL #5   Title  Patient will decrease 5 times sit <> stand time from chair with single arm rest to 15.5 seconds or less in order to demo improved LE strength.    Baseline  15.47 seconds from chair with single UE support on 04/19/19    Time  2    Period  Weeks    Status  Achieved      PT LONG TERM GOAL #6   Title  Patient will perform  w/c mobility with power w/c outside over paved/cement surfaces and around obstacles for 500' with mod I in order to improve community mobility and safety.    Baseline  pt will be bringing in his power w/c at next session.    Time  2    Period  Weeks    Status  Deferred      PT LONG TERM GOAL #7   Title  Patient will perform stand pivot t/f from power w/c <> mat table using single point cane and mod I in order to improve safety at home.    Baseline  from prior session pt able to perform transfer with supervision with [facility]    Time  2    Period  Weeks    Status  Partially Met      PT LONG TERM GOAL #8   Title  Patient will improve baseline BERG score by at least 2 points in order to demo decreased fall risk.    Baseline  49/56 on 04/19/19    Time  2    Period  Weeks    Status  Achieved          PHYSICAL THERAPY DISCHARGE SUMMARY  Visits from Start of Care: 15  Current functional level related to goals / functional outcomes: See LTGs.   Remaining deficits: Decreased endurance, impaired static balance in SLS and narrow BOS, impaired LE strength.     Education / Equipment: Final HEP.  Plan: Patient agrees to discharge.  Patient goals were met. Patient is being discharged due to meeting the stated rehab goals.  ?????        Plan - 04/19/19 1031    Clinical Impression Statement  Focus of today's session was assessing remainder of pt's LTGs. Pt has met LTGs today in regards to independence with HEP, 5x sit <> stand time, and BERG score. Pt has improved his BERG score to 49/56, putting pt at a moderate risk for falls (previously 42/56). Verbally/demonstrative reviewed remainder of HEP, educated pt on importance on performing daily in order to build upon strength and balance improvements made in therapy - pt verbalized understanding. Pt is pleased with his progress made in therapy and will be discharged at this time.    Personal Factors and Comorbidities  Comorbidity  3+;Past/Current Experience;Time since onset of injury/illness/exacerbation    Comorbidities  HTN, HLD, prostate  cancer, COPD, blind in L eye. OA and gout.    Examination-Activity Limitations  Locomotion Level;Stand;Transfers    Stability/Clinical Decision Making  Evolving/Moderate complexity    Rehab Potential  Fair    PT Frequency  2x / week    PT Duration  2 weeks    PT Treatment/Interventions  ADLs/Self Care Home Management;DME Instruction;Gait training;Functional mobility training;Neuromuscular re-education;Balance training;Therapeutic exercise;Therapeutic activities;Patient/family education;Wheelchair mobility training;Passive range of motion;Orthotic Fit/Training;Energy conservation    PT Next Visit Plan  D/C.    PT Home Exercise Plan  [email or web address]    Consulted and Agree with Plan of Care  Patient       Patient will benefit from skilled therapeutic intervention in order to improve the following deficits and impairments:  Abnormal gait, Decreased activity tolerance, Decreased balance, Decreased endurance, Decreased coordination, Decreased mobility, Decreased range of motion, Difficulty walking, Decreased strength, Impaired tone, Postural dysfunction  Visit Diagnosis: Other symptoms and signs involving the nervous system  Unsteadiness on feet  Abnormal posture  Other abnormalities of gait and mobility  Difficulty in walking, not elsewhere classified  Muscle weakness (generalized)     Problem List Patient Active Problem List   Diagnosis Date Noted  . Malignant neoplasm of prostate ([place]) 08/19/2018    Arliss Journey, PT, DPT  04/19/2019, 10:34 AM  [facility] [address] [place], [place], [address] Phone: [phone]   Fax:  [phone]  Name: [name] MRN: [id number] Date of Birth: 06/01/1961

## 2019-05-18 ENCOUNTER — Other Ambulatory Visit: Payer: Self-pay

## 2019-05-18 ENCOUNTER — Encounter: Payer: Self-pay | Admitting: Physical Medicine & Rehabilitation

## 2019-05-18 ENCOUNTER — Encounter: Payer: Medicaid Other | Attending: Physical Medicine & Rehabilitation | Admitting: Physical Medicine & Rehabilitation

## 2019-05-18 VITALS — HR 86 | Temp 97.7°F | Ht 65.5 in | Wt 236.0 lb

## 2019-05-18 DIAGNOSIS — I639 Cerebral infarction, unspecified: Secondary | ICD-10-CM | POA: Insufficient documentation

## 2019-05-18 DIAGNOSIS — I6930 Unspecified sequelae of cerebral infarction: Secondary | ICD-10-CM | POA: Diagnosis present

## 2019-05-18 DIAGNOSIS — G8114 Spastic hemiplegia affecting left nondominant side: Secondary | ICD-10-CM | POA: Diagnosis present

## 2019-05-18 DIAGNOSIS — I69354 Hemiplegia and hemiparesis following cerebral infarction affecting left non-dominant side: Secondary | ICD-10-CM | POA: Diagnosis not present

## 2019-05-18 NOTE — Progress Notes (Signed)
Subjective:    Patient ID: [name], male    DOB: [birth year], 58 y.o.   MRN: [id number]  HPI  58 year old male with history of right basal ganglia hemorrhage in 2006.  He has had a chronic left spastic hemiplegia. Pt had Botox on 04/02/2019 Pt feels it helped  for about a month Fingers and elbow were looser   Wears a left resting hand splint   Pectoralis75 Biceps75 Brachiorad 25 FCR50  FDS50 FDP25 Pain Inventory Average Pain 0 Pain Right Now 0 My pain is na  In the last 24 hours, has pain interfered with the following? General activity 0 Relation with others 0 Enjoyment of life 0 What TIME of day is your pain at its worst? na Sleep (in general) Fair  Pain is worse with: na Pain improves with: na Relief from Meds: 10  Mobility use a cane ability to climb steps?  yes do you drive?  yes  Function disabled: date disabled na I need assistance with the following:  dressing, bathing, meal prep, household duties and shopping  Neuro/Psych trouble walking spasms  Prior Studies Any changes since last visit?  no  Physicians involved in your care Any changes since last visit?  no   Family History  Problem Relation Age of Onset  . Brain cancer Maternal Grandfather    Social History   Socioeconomic History  . Marital status: Single    Spouse name: Not on file  . Number of children: 0  . Years of education: Not on file  . Highest education level: Not on file  Occupational History    Comment: disability  Social Needs  . Financial resource strain: Not on file  . Food insecurity    Worry: Not on file    Inability: Not on file  . Transportation needs    Medical: No    Non-medical: No  Tobacco Use  . Smoking status: Current Every Day Smoker    Packs/day: 0.50    Years: 15.00    Pack years: 7.50    Types: Cigarettes  . Smokeless tobacco: Never Used  Substance and Sexual Activity  . Alcohol use: No  . Drug use: No  . Sexual activity: Not  Currently  Lifestyle  . Physical activity    Days per week: Not on file    Minutes per session: Not on file  . Stress: Not on file  Relationships  . Social Herbalist on phone: Not on file    Gets together: Not on file    Attends religious service: Not on file    Active member of club or organization: Not on file    Attends meetings of clubs or organizations: Not on file    Relationship status: Not on file  Other Topics Concern  . Not on file  Social History Narrative  . Not on file   Past Surgical History:  Procedure Laterality Date  . COLONOSCOPY WITH PROPOFOL N/A 09/27/2015   Procedure: COLONOSCOPY WITH PROPOFOL;  Surgeon: [name], MD;  [facility];  Service: Endoscopy;  Laterality: N/A;  . CYSTOSCOPY N/A 10/05/2012   Procedure: CYSTOSCOPY, retrograde and right stent placement;  Surgeon: [name], MD;  [facility];  Service: Urology;  Laterality: N/A;  . CYSTOSCOPY WITH RETROGRADE PYELOGRAM, URETEROSCOPY AND STENT PLACEMENT Right 04/13/2014   Procedure: CYSTOSCOPY WITH RETROGRADE PYELOGRAM, URETEROSCOPY AND STENT PLACEMENT, LASER ABLATION OF SCAR TISSUE;  Surgeon: [name], MD;  [facility];  Service:  Urology;  Laterality: Right;  . EYE SURGERY Left age 81  . HOLMIUM LASER APPLICATION Right [id number]   Procedure: HOLMIUM LASER APPLICATION;  Surgeon: [name], MD;  [facility];  Service: Urology;  Laterality: Right;  . NEPHROLITHOTOMY Right 10/05/2012   Procedure: NEPHROLITHOTOMY PERCUTANEOUS;  Surgeon: [name], MD;  [facility];  Service: Urology;  Laterality: Right;  WITH CYSTO AND SURGEON ACCESS   . NEPHROLITHOTOMY Right 10/07/2012   Procedure: NEPHROLITHOTOMY PERCUTANEOUS SECOND LOOK. Stent exchange,Ureteroscopy ;  Surgeon: [name], MD;  [facility];  Service: Urology;  Laterality: Right;  . RADIOACTIVE SEED IMPLANT N/A 11/12/2018   Procedure: RADIOACTIVE SEED IMPLANT/BRACHYTHERAPY IMPLANT;   Surgeon: [name], MD;  [facility];  Service: Urology;  Laterality: N/A;  90 MINS  . SPACE OAR INSTILLATION N/A 11/12/2018   Procedure: SPACE OAR INSTILLATION;  Surgeon: [name], MD;  [facility];  Service: Urology;  Laterality: N/A;  . TOENAIL EXCISION     Past Medical History:  Diagnosis Date  . Arthritis   . 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 COPD (chronic obstructive pulmonary disease) ([place])   . GERD (gastroesophageal reflux disease)   . 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 History of gout   . History of kidney stones   . Hyperlipidemia   . Hypertension   . Prostate cancer [facility]) urologist-- dr Darene Lamer. Tresa Moore;  oncologist-  dr [name]   dx 07-20-2017  . Renal calculi   . Stroke ([place]) sept 26,  20006    hx mini strokes in past, 1 large stroke  . Weakness of left side of body 03/2005   cva residual  . Weakness of one side of body    L sided weakness after stroke   There were no vitals taken for this visit.  Opioid Risk Score:   Fall Risk Score:  `1  Depression screen PHQ 2/9  No flowsheet data found.   Review of Systems  Constitutional: Negative.   HENT: Negative.   Eyes: Negative.   Respiratory: Negative.   Cardiovascular: Negative.   Gastrointestinal: Negative.   Endocrine: Negative.   Genitourinary: Negative.   Musculoskeletal: Positive for arthralgias, gait problem and myalgias.  Skin: Negative.   Allergic/Immunologic: Negative.   Hematological: Negative.   Psychiatric/Behavioral: Negative.   All other systems reviewed and are negative.      Objective:   Physical Exam Vitals signs reviewed.  Constitutional:      Appearance: Normal appearance. He is obese.  Neurological:     Mental Status: He is alert.   Contracture at PIPs digits 2 through 4 on the left hand. Left upper extremity Motor  strength is 3 - at the left deltoid to minus at the bicep and tricep 0 at the finger flexors and extensors and the wrist flexors and extensors Tone MAS 2 at the pectoralis MAS 3 at the elbow flexors MAS 2 at the wrist flexors MAS 2 at the finger flexors and thumb flexor. MAS 3 in pronators       Assessment & Plan:  #1.  Left spastic hemiplegia longstanding.  He does have contractures of digits 2 through 5 therefore would not recommend repeat FDS but will make the following alterations to his Botox injection.  We will repeat in 6 weeks.  Pecs 100U Biceps 100 Brachiorad 50  FCR 50 FCU 50  FDP 25 PT 25

## 2019-05-18 NOTE — Patient Instructions (Signed)
OnabotulinumtoxinA injection (Medical Use) What is this medicine? ONABOTULINUMTOXINA (o na BOTT you lye num tox in eh) is a neuro-muscular blocker. This medicine is used to treat crossed eyes, eyelid spasms, severe neck muscle spasms, ankle and toe muscle spasms, and elbow, wrist, and finger muscle spasms. It is also used to treat excessive underarm sweating, to prevent chronic migraine headaches, and to treat loss of bladder control due to neurologic conditions such as multiple sclerosis or spinal cord injury. This medicine may be used for other purposes; ask your health care provider or pharmacist if you have questions. COMMON BRAND NAME(S): Botox What should I tell my health care provider before I take this medicine? They need to know if you have any of these conditions:  breathing problems  cerebral palsy spasms  difficulty urinating  heart problems  history of surgery where this medicine is going to be used  infection at the site where this medicine is going to be used  myasthenia gravis or other neurologic disease  nerve or muscle disease  surgery plans  take medicines that treat or prevent blood clots  thyroid problems  an unusual or allergic reaction to botulinum toxin, albumin, other medicines, foods, dyes, or preservatives  pregnant or trying to get pregnant  breast-feeding How should I use this medicine? This medicine is for injection into a muscle. It is given by a health care professional in a hospital or clinic setting. Talk to your pediatrician regarding the use of this medicine in children. While this drug may be prescribed for children as young as 11 years old for selected conditions, precautions do apply. Overdosage: If you think you have taken too much of this medicine contact a poison control center or emergency room at once. NOTE: This medicine is only for you. Do not share this medicine with others. What if I miss a dose? This does not apply. What may  interact with this medicine?  aminoglycoside antibiotics like gentamicin, neomycin, tobramycin  muscle relaxants  other botulinum toxin injections This list may not describe all possible interactions. Give your health care provider a list of all the medicines, herbs, non-prescription drugs, or dietary supplements you use. Also tell them if you smoke, drink alcohol, or use illegal drugs. Some items may interact with your medicine. What should I watch for while using this medicine? Visit your doctor for regular check ups. This medicine will cause weakness in the muscle where it is injected. Tell your doctor if you feel unusually weak in other muscles. Get medical help right away if you have problems with breathing, swallowing, or talking. This medicine might make your eyelids droop or make you see blurry or double. If you have weak muscles or trouble seeing do not drive a car, use machinery, or do other dangerous activities. This medicine contains albumin from human blood. It may be possible to pass an infection in this medicine, but no cases have been reported. Talk to your doctor about the risks and benefits of this medicine. If your activities have been limited by your condition, go back to your regular routine slowly after treatment with this medicine. What side effects may I notice from receiving this medicine? Side effects that you should report to your doctor or health care professional as soon as possible:  allergic reactions like skin rash, itching or hives, swelling of the face, lips, or tongue  breathing problems  changes in vision  chest pain or tightness  eye irritation, pain  fast, irregular heartbeat  infection  numbness  speech problems  swallowing problems  unusual weakness Side effects that usually do not require medical attention (report to your doctor or health care professional if they continue or are bothersome):  bruising or pain at site where  injected  drooping eyelid  dry eyes or mouth  headache  muscles aches, pains  sensitivity to light  tearing This list may not describe all possible side effects. Call your doctor for medical advice about side effects. You may report side effects to FDA at 1-800-FDA-1088. Where should I keep my medicine? This drug is given in a hospital or clinic and will not be stored at home. NOTE: This sheet is a summary. It may not cover all possible information. If you have questions about this medicine, talk to your doctor, pharmacist, or health care provider.  2020 Elsevier/Gold Standard (2017-12-22 14:21:42)  

## 2019-07-06 ENCOUNTER — Emergency Department (HOSPITAL_BASED_OUTPATIENT_CLINIC_OR_DEPARTMENT_OTHER)
Admission: EM | Admit: 2019-07-06 | Discharge: 2019-07-06 | Disposition: A | Discharge status: Home / Self Care | Payer: Medicaid Other | Attending: Emergency Medicine | Admitting: Emergency Medicine

## 2019-07-06 ENCOUNTER — Other Ambulatory Visit: Payer: Self-pay

## 2019-07-06 ENCOUNTER — Encounter: Payer: Self-pay | Admitting: Physical Medicine & Rehabilitation

## 2019-07-06 ENCOUNTER — Emergency Department (HOSPITAL_BASED_OUTPATIENT_CLINIC_OR_DEPARTMENT_OTHER): Payer: Medicaid Other

## 2019-07-06 ENCOUNTER — Encounter (HOSPITAL_BASED_OUTPATIENT_CLINIC_OR_DEPARTMENT_OTHER): Payer: Self-pay

## 2019-07-06 ENCOUNTER — Encounter: Payer: Medicaid Other | Attending: Physical Medicine & Rehabilitation | Admitting: Physical Medicine & Rehabilitation

## 2019-07-06 VITALS — BP 154/94 | HR 96 | Temp 97.7°F | Ht 65.5 in | Wt 244.0 lb

## 2019-07-06 DIAGNOSIS — R0602 Shortness of breath: Secondary | ICD-10-CM | POA: Insufficient documentation

## 2019-07-06 DIAGNOSIS — Z5321 Procedure and treatment not carried out due to patient leaving prior to being seen by health care provider: Secondary | ICD-10-CM | POA: Insufficient documentation

## 2019-07-06 DIAGNOSIS — I6930 Unspecified sequelae of cerebral infarction: Secondary | ICD-10-CM | POA: Insufficient documentation

## 2019-07-06 DIAGNOSIS — G8114 Spastic hemiplegia affecting left nondominant side: Secondary | ICD-10-CM | POA: Diagnosis present

## 2019-07-06 DIAGNOSIS — I69354 Hemiplegia and hemiparesis following cerebral infarction affecting left non-dominant side: Secondary | ICD-10-CM | POA: Diagnosis present

## 2019-07-06 DIAGNOSIS — I639 Cerebral infarction, unspecified: Secondary | ICD-10-CM | POA: Diagnosis present

## 2019-07-06 LAB — BASIC METABOLIC PANEL
Anion gap: 9 (ref 5–15)
BUN: 9 mg/dL (ref 6–20)
CO2: 29 mmol/L (ref 22–32)
Calcium: 9 mg/dL (ref 8.9–10.3)
Chloride: 100 mmol/L (ref 98–111)
Creatinine, Ser: 0.89 mg/dL (ref 0.61–1.24)
GFR calc Af Amer: >60 mL/min (ref >60)
GFR calc non Af Amer: >60 mL/min (ref >60)
Glucose, Bld: 105 mg/dL — ABNORMAL HIGH (ref 70–99)
Potassium: 3.5 mmol/L (ref 3.5–5.1)
Sodium: 138 mmol/L (ref 135–145)

## 2019-07-06 LAB — CBC WITH DIFFERENTIAL/PLATELET
Abs Immature Granulocytes: 0.02 10*3/uL (ref 0.00–0.07)
Basophils Absolute: 0 10*3/uL (ref 0.0–0.1)
Basophils Relative: 0 %
Eosinophils Absolute: 0.1 10*3/uL (ref 0.0–0.5)
Eosinophils Relative: 2 %
HCT: 42.3 % (ref 39.0–52.0)
Hemoglobin: 12.8 g/dL — ABNORMAL LOW (ref 13.0–17.0)
Immature Granulocytes: 0 %
Lymphocytes Relative: 25 %
Lymphs Abs: 1.7 10*3/uL (ref 0.7–4.0)
MCH: 24.9 pg — ABNORMAL LOW (ref 26.0–34.0)
MCHC: 30.3 g/dL (ref 30.0–36.0)
MCV: 82.1 fL (ref 80.0–100.0)
Monocytes Absolute: 0.6 10*3/uL (ref 0.1–1.0)
Monocytes Relative: 8 %
Neutro Abs: 4.5 10*3/uL (ref 1.7–7.7)
Neutrophils Relative %: 65 %
Platelets: 278 10*3/uL (ref 150–400)
RBC: 5.15 MIL/uL (ref 4.22–5.81)
RDW: 20.1 % — ABNORMAL HIGH (ref 11.5–15.5)
WBC: 7 10*3/uL (ref 4.0–10.5)
nRBC: 0 % (ref 0.0–0.2)

## 2019-07-06 NOTE — Patient Instructions (Signed)

## 2019-07-06 NOTE — ED Triage Notes (Signed)
Pt c/o SOB x "couple days"-denies cough then states pain to left chest and left upper back with cough-NAD-to triage in w/c

## 2019-07-06 NOTE — Progress Notes (Signed)
Botox Injection for spasticity using needle EMG guidance  Dilution: 50 Units/ml Indication: Severe spasticity which interferes with ADL,mobility and/or  hygiene and is unresponsive to medication management and other conservative care Informed consent was obtained after describing risks and benefits of the procedure with the patient. This includes bleeding, bruising, infection, excessive weakness, or medication side effects. A REMS form is on file and signed. Needle: 27g 1" needle electrode Number of units per muscle  Pecs 100U Biceps 100 Brachiorad 50 FCR 50 FCU 50  FDP 25 PT 25 All injections were done after obtaining appropriate EMG activity and after negative drawback for blood. The patient tolerated the procedure well. Post procedure instructions were given. A followup appointment was made.

## 2019-07-14 ENCOUNTER — Ambulatory Visit: Payer: Medicaid Other | Admitting: Podiatry

## 2019-07-14 ENCOUNTER — Other Ambulatory Visit: Payer: Self-pay

## 2019-07-14 ENCOUNTER — Encounter: Payer: Self-pay | Admitting: Podiatry

## 2019-07-14 DIAGNOSIS — M79675 Pain in left toe(s): Secondary | ICD-10-CM

## 2019-07-14 DIAGNOSIS — M79674 Pain in right toe(s): Secondary | ICD-10-CM | POA: Diagnosis not present

## 2019-07-14 DIAGNOSIS — B351 Tinea unguium: Secondary | ICD-10-CM | POA: Diagnosis not present

## 2019-07-14 NOTE — Patient Instructions (Signed)
Diabetes Mellitus and Foot Care Foot care is an important part of your health, especially when you have diabetes. Diabetes may cause you to have problems because of poor blood flow (circulation) to your feet and legs, which can cause your skin to:  Become thinner and drier.  Break more easily.  Heal more slowly.  Peel and crack. You may also have nerve damage (neuropathy) in your legs and feet, causing decreased feeling in them. This means that you may not notice minor injuries to your feet that could lead to more serious problems. Noticing and addressing any potential problems early is the best way to prevent future foot problems. How to care for your feet Foot hygiene  Wash your feet daily with warm water and mild soap. Do not use hot water. Then, pat your feet and the areas between your toes until they are completely dry. Do not soak your feet as this can dry your skin.  Trim your toenails straight across. Do not dig under them or around the cuticle. File the edges of your nails with an emery board or nail file.  Apply a moisturizing lotion or petroleum jelly to the skin on your feet and to dry, brittle toenails. Use lotion that does not contain alcohol and is unscented. Do not apply lotion between your toes. Shoes and socks  Wear clean socks or stockings every day. Make sure they are not too tight. Do not wear knee-high stockings since they may decrease blood flow to your legs.  Wear shoes that fit properly and have enough cushioning. Always look in your shoes before you put them on to be sure there are no objects inside.  To break in new shoes, wear them for just a few hours a day. This prevents injuries on your feet. Wounds, scrapes, corns, and calluses  Check your feet daily for blisters, cuts, bruises, sores, and redness. If you cannot see the bottom of your feet, use a mirror or ask someone for help.  Do not cut corns or calluses or try to remove them with medicine.  If you  find a minor scrape, cut, or break in the skin on your feet, keep it and the skin around it clean and dry. You may clean these areas with mild soap and water. Do not clean the area with peroxide, alcohol, or iodine.  If you have a wound, scrape, corn, or callus on your foot, look at it several times a day to make sure it is healing and not infected. Check for: ? Redness, swelling, or pain. ? Fluid or blood. ? Warmth. ? Pus or a bad smell. General instructions  Do not cross your legs. This may decrease blood flow to your feet.  Do not use heating pads or hot water bottles on your feet. They may burn your skin. If you have lost feeling in your feet or legs, you may not know this is happening until it is too late.  Protect your feet from hot and cold by wearing shoes, such as at the beach or on hot pavement.  Schedule a complete foot exam at least once a year (annually) or more often if you have foot problems. If you have foot problems, report any cuts, sores, or bruises to your health care provider immediately. Contact a health care provider if:  You have a medical condition that increases your risk of infection and you have any cuts, sores, or bruises on your feet.  You have an injury that is not   healing.  You have redness on your legs or feet.  You feel burning or tingling in your legs or feet.  You have pain or cramps in your legs and feet.  Your legs or feet are numb.  Your feet always feel cold.  You have pain around a toenail. Get help right away if:  You have a wound, scrape, corn, or callus on your foot and: ? You have pain, swelling, or redness that gets worse. ? You have fluid or blood coming from the wound, scrape, corn, or callus. ? Your wound, scrape, corn, or callus feels warm to the touch. ? You have pus or a bad smell coming from the wound, scrape, corn, or callus. ? You have a fever. ? You have a red line going up your leg. Summary  Check your feet every day  for cuts, sores, red spots, swelling, and blisters.  Moisturize feet and legs daily.  Wear shoes that fit properly and have enough cushioning.  If you have foot problems, report any cuts, sores, or bruises to your health care provider immediately.  Schedule a complete foot exam at least once a year (annually) or more often if you have foot problems. This information is not intended to replace advice given to you by your health care provider. Make sure you discuss any questions you have with your health care provider. Document Revised: 03/10/2019 Document Reviewed: 07/19/2016 Elsevier Patient Education  2020 Elsevier Inc.  

## 2019-07-19 NOTE — Progress Notes (Signed)
Subjective:  [name] presents to clinic today with cc of  painful, thick, discolored, elongated toenails  of both feet that become tender and patient cannot cut because of thickness. Pain is aggravated when wearing enclosed shoe gear and relieved with periodic professional debridement.  Patient voices no new pedal concerns on today's visit.  Medications reviewed in chart.  No Known Allergies   Objective:  Physical Examination:  Vascular Examination: Capillary refill time immediate b/l.  Palpable DP/PT pulses b/l.  Digital hair absent b/l.   No edema noted b/l.  Skin temperature gradient WNL b/l.  Dermatological Examination: Skin with normal turgor, texture and tone b/l.  No open wounds b/l.  No interdigital macerations noted b/l.  Anonychia b/l great and 2nd toe(s) with evidence of permanent total nail avulsion. Nailbed(s) completely epithelialized and intact.  Elongated, thick, discolored brittle toenails with subungual debris and pain on dorsal palpation of nailbeds 3-5 b/l.  Porokeratotic lesion sub 5th met base right footwith tenderness to palpation. No erythema, no edema, no drainage, no flocculence.  Hyperkeratotic lesion submet head 5 b/l and with tenderness to palpation. No edema, no erythema, no drainage, no flocculence.  Musculoskeletal Examination: Muscle strength 5/5 to all muscle groups b/l.  No pain, crepitus or joint discomfort with active/passive ROM.  Neurological Examination: Sensation intact 5/5 b/l with 10 gram monofilament  Assessment: Mycotic nail infection with pain 1-5 b/l  Plan: 1. Toenails 1-5 b/l were debrided in length and girth without iatrogenic laceration. 2.  Continue soft, supportive shoe gear daily. 3.  Report any pedal injuries to medical professional. 4.  Follow up 3 months. 5.  Patient/POA to call should there be a question/concern in there interim.

## 2019-08-17 ENCOUNTER — Encounter: Payer: Self-pay | Admitting: Physical Medicine & Rehabilitation

## 2019-08-17 ENCOUNTER — Other Ambulatory Visit: Payer: Self-pay

## 2019-08-17 ENCOUNTER — Encounter: Payer: Medicaid Other | Attending: Physical Medicine & Rehabilitation | Admitting: Physical Medicine & Rehabilitation

## 2019-08-17 ENCOUNTER — Encounter: Payer: Medicaid Other | Admitting: Physical Medicine & Rehabilitation

## 2019-08-17 VITALS — BP 138/84 | HR 95 | Temp 97.8°F | Ht 65.5 in | Wt 236.0 lb

## 2019-08-17 DIAGNOSIS — G8114 Spastic hemiplegia affecting left nondominant side: Secondary | ICD-10-CM

## 2019-08-17 DIAGNOSIS — I639 Cerebral infarction, unspecified: Secondary | ICD-10-CM | POA: Diagnosis present

## 2019-08-17 DIAGNOSIS — I69354 Hemiplegia and hemiparesis following cerebral infarction affecting left non-dominant side: Secondary | ICD-10-CM | POA: Diagnosis present

## 2019-08-17 DIAGNOSIS — I6930 Unspecified sequelae of cerebral infarction: Secondary | ICD-10-CM | POA: Diagnosis present

## 2019-08-17 NOTE — Progress Notes (Signed)
Subjective:    Patient ID: [name], male    DOB: Feb 19, 1961, 59 y.o.   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  The patient feels like the last botulinum toxin injection was very helpful to relieve spasticity in his shoulder and elbow.  He had less relief of his spasticity in his finger flexors His total dose of botulinum toxin was 400 units.  07/11/18 Pecs 100U Biceps 100 Brachiorad 50 FCR 50 FCU 50  FDP 25 PT 25  Pain Inventory Average Pain 0 Pain Right Now 0 My pain is na  In the last 24 hours, has pain interfered with the following? General activity 0 Relation with others 0 Enjoyment of life 0 What TIME of day is your pain at its worst? na Sleep (in general) Good  Pain is worse with: na Pain improves with: na Relief from Meds: na  Mobility walk with assistance use a cane ability to climb steps?  yes do you drive?  yes  Function disabled: date disabled 2006 I need assistance with the following:  feeding, dressing, bathing, toileting, meal prep, household duties and shopping  Neuro/Psych bladder control problems bowel control problems weakness trouble walking spasms  Prior Studies Any changes since last visit?  no  Physicians involved in your care Any changes since last visit?  no   Family History  Problem Relation Age of Onset  . Brain cancer Maternal Grandfather    Social History   Socioeconomic History  . Marital status: Single    Spouse name: Not on file  . Number of children: 0  . Years of education: Not on file  . Highest education level: Not on file  Occupational History    Comment: disability  Tobacco Use  . Smoking status: Current Every Day Smoker    Packs/day: 0.50    Years: 15.00    Pack years: 7.50    Types: Cigarettes  . Smokeless tobacco: Never Used  Substance and Sexual Activity  . Alcohol use: No  . Drug use: No  . Sexual  activity: Not on file  Other Topics Concern  . Not on file  Social History Narrative  . Not on file   Social Determinants of Health   Financial Resource Strain:   . Difficulty of Paying Living Expenses: Not on file  Food Insecurity:   . Worried About Charity fundraiser in the Last Year: Not on file  . Ran Out of Food in the Last Year: Not on file  Transportation Needs: No Transportation Needs  . Lack of Transportation (Medical): No  . Lack of Transportation (Non-Medical): No  Physical Activity:   . Days of Exercise per Week: Not on file  . Minutes of Exercise per Session: Not on file  Stress:   . Feeling of Stress : Not on file  Social Connections:   . Frequency of Communication with Friends and Family: Not on file  . Frequency of Social Gatherings with Friends and Family: Not on file  . Attends Religious Services: Not on file  . Active Member of Clubs or Organizations: Not on file  . Attends Archivist Meetings: Not on file  . Marital Status: Not on file   Past Surgical History:  Procedure Laterality Date  . COLONOSCOPY WITH PROPOFOL N/A 09/27/2015   Procedure: COLONOSCOPY WITH PROPOFOL;  Surgeon: [name], MD;  Location: [facility];  Service: Endoscopy;  Laterality: N/A;  . CYSTOSCOPY N/A 10/05/2012   Procedure: CYSTOSCOPY, retrograde and right stent placement;  Surgeon: [name], MD;  Location: [facility];  Service: Urology;  Laterality: N/A;  . CYSTOSCOPY WITH RETROGRADE PYELOGRAM, URETEROSCOPY AND STENT PLACEMENT Right 04/13/2014   Procedure: CYSTOSCOPY WITH RETROGRADE PYELOGRAM, URETEROSCOPY AND STENT PLACEMENT, LASER ABLATION OF SCAR TISSUE;  Surgeon: [name], MD;  Location: [facility];  Service: Urology;  Laterality: Right;  . EYE SURGERY Left age 61  . HOLMIUM LASER APPLICATION Right [id number]   Procedure: HOLMIUM LASER APPLICATION;  Surgeon: [name], MD;  Location: [facility];  Service: Urology;  Laterality: Right;  . NEPHROLITHOTOMY Right  10/05/2012   Procedure: NEPHROLITHOTOMY PERCUTANEOUS;  Surgeon: [name], MD;  Location: [facility];  Service: Urology;  Laterality: Right;  WITH CYSTO AND SURGEON ACCESS   . NEPHROLITHOTOMY Right 10/07/2012   Procedure: NEPHROLITHOTOMY PERCUTANEOUS SECOND LOOK. Stent exchange,Ureteroscopy ;  Surgeon: [name], MD;  Location: [facility];  Service: Urology;  Laterality: Right;  . RADIOACTIVE SEED IMPLANT N/A 11/12/2018   Procedure: RADIOACTIVE SEED IMPLANT/BRACHYTHERAPY IMPLANT;  Surgeon: [name], MD;  Location: [facility];  Service: Urology;  Laterality: N/A;  90 MINS  . SPACE OAR INSTILLATION N/A 11/12/2018   Procedure: SPACE OAR INSTILLATION;  Surgeon: [name], MD;  Location: [facility];  Service: Urology;  Laterality: N/A;  . TOENAIL EXCISION     Past Medical History:  Diagnosis Date  . Arthritis   . 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 COPD (chronic obstructive pulmonary disease) ([place])   . GERD (gastroesophageal reflux disease)   . 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 History of gout   . History of kidney stones   . Hyperlipidemia   . Hypertension   . Prostate cancer [facility]) urologist-- dr Darene Lamer. [facility];  oncologist-  dr [name]   dx 07-20-2017  . Renal calculi   . Stroke ([place]) sept 26,  20006    hx mini strokes in past, 1 large stroke  . Weakness of left side of body 03/2005   cva residual  . Weakness of one side of body    L sided weakness after stroke   There were no vitals taken for this visit.  Opioid Risk Score:   Fall Risk Score:  `1  Depression screen PHQ 2/9  No flowsheet data found.   Review of Systems  Constitutional: Negative.   HENT: Negative.   Eyes: Negative.   Respiratory: Negative.   Cardiovascular: Negative.   Gastrointestinal: Negative.   Endocrine: Negative.   Genitourinary: Positive for difficulty  urinating.  Musculoskeletal: Positive for myalgias.  Skin: Negative.   Allergic/Immunologic: Negative.   Neurological: Positive for weakness.  Hematological: Negative.   Psychiatric/Behavioral: Negative.   All other systems reviewed and are negative.      Objective:   Physical Exam Vitals and nursing note reviewed.  Constitutional:      Appearance: He is obese.  HENT:     Head: Normocephalic and atraumatic.  Eyes:     General: No visual field deficit.    Extraocular Movements: Extraocular movements intact.     Conjunctiva/sclera: Conjunctivae normal.     Pupils: Pupils are equal, round, and reactive to light.  Skin:    General: Skin is warm and dry.  Neurological:     Mental Status: He is  alert and oriented to person, place, and time.     Cranial Nerves: No dysarthria.  Psychiatric:        Mood and Affect: Mood normal.        Behavior: Behavior normal.        Thought Content: Thought content normal.     Tone MAS 3 in the finger flexors 1 in the wrist flexor 1 in the elbow flexor and 2 at the pectoralis. His motor strength 3 - deltoid to minus biceps 0 finger flexors and extensors.       Assessment & Plan:  #1.  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  His pectoralis and elbow flexor tone were relieved with the botulinum toxin injection however his finger flexor tone is still significant.  Would increase finger flexor dosage and reduce elbow flexor as well as pectoralis dosage.  Pecs 75U Biceps 50 Brachiorad 50 FCR 50 FCU 50  FDP 50 FDS 50 PT 25

## 2019-09-15 ENCOUNTER — Ambulatory Visit: Payer: Medicaid Other | Admitting: Podiatry

## 2019-09-15 ENCOUNTER — Encounter: Payer: Self-pay | Admitting: Podiatry

## 2019-09-15 ENCOUNTER — Other Ambulatory Visit: Payer: Self-pay

## 2019-09-15 VITALS — Temp 97.5°F

## 2019-09-15 DIAGNOSIS — M79675 Pain in left toe(s): Secondary | ICD-10-CM

## 2019-09-15 DIAGNOSIS — M79674 Pain in right toe(s): Secondary | ICD-10-CM

## 2019-09-15 DIAGNOSIS — M79671 Pain in right foot: Secondary | ICD-10-CM | POA: Diagnosis not present

## 2019-09-15 DIAGNOSIS — B351 Tinea unguium: Secondary | ICD-10-CM

## 2019-09-15 DIAGNOSIS — M79672 Pain in left foot: Secondary | ICD-10-CM | POA: Diagnosis not present

## 2019-09-15 DIAGNOSIS — Q828 Other specified congenital malformations of skin: Secondary | ICD-10-CM

## 2019-09-15 DIAGNOSIS — L84 Corns and callosities: Secondary | ICD-10-CM

## 2019-09-15 NOTE — Patient Instructions (Signed)

## 2019-09-19 NOTE — Progress Notes (Signed)
Subjective: [name] presents today for follow up of painful mycotic nails b/l that are difficult to trim. Pain interferes with ambulation. Aggravating factors include wearing enclosed shoe gear. Pain is relieved with periodic professional debridement and painful plantar lesions b/l feet.  Pain prevent comfortable ambulation. Aggravating factor is weightbearing with or without shoegear.   No Known Allergies   Objective: Vitals:   09/15/19 0825  Temp: (!) 97.5 F (36.4 C)    Pt 58 y.o. year old AA male  obese in NAD. AAO x 3.   Vascular Examination:  Capillary refill time to digits immediate b/l. Palpable DP pulses b/l. Palpable PT pulses b/l. Pedal hair absent b/l Skin temperature gradient within normal limits b/l.  Dermatological Examination: Pedal skin with normal turgor, texture and tone bilaterally. No open wounds bilaterally. No interdigital macerations bilaterally. Toenails L 3rd toe, L 4th toe, L 5th toe, R 3rd toe, R 4th toe and R 5th toe elongated, dystrophic, thickened, and crumbly with subungual debris and tenderness to dorsal palpation. Anonychia noted L hallux, L 2nd toe, R hallux and R 2nd toe. Nailbed(s) epithelialized.  Hyperkeratotic lesion(s) submet head 5 b/l.  No erythema, no edema, no drainage, no flocculence. Porokeratotic lesion(s) sub 5th met base right foot. No erythema, no edema, no drainage, no flocculence.  Musculoskeletal: Normal muscle strength 5/5 to all lower extremity muscle groups bilaterally, no gross bony deformities bilaterally and no pain crepitus or joint limitation noted with ROM b/l  Neurological: Protective sensation intact 5/5 intact bilaterally with 10g monofilament b/l Vibratory sensation intact b/l  Assessment: 1. Pain due to onychomycosis of toenails of both feet   2. Porokeratosis   3. Callus   4. Pain in both feet    Plan: -Medicaid ABN signed for 2021. Copy given to patient on today's visit and copy placed in patient  chart. -Toenails 3-5 b/l debrided in length and girth without iatrogenic bleeding with sterile nail nipper and dremel.  -Callus(es) submet head 5 b/l were debrided without complication or incident. Total number debrided =2. -Painful porokeratotic lesion(s) sub 5th met base right foot pared and enucleated with sterile scalpel blade without incident. -Patient to continue soft, supportive shoe gear daily. -Patient to report any pedal injuries to medical professional immediately. -Patient/POA to call should there be question/concern in the interim.  Return in about 9 weeks (around 11/17/2019). 

## 2019-10-05 ENCOUNTER — Encounter: Payer: Medicaid Other | Attending: Physical Medicine & Rehabilitation | Admitting: Physical Medicine & Rehabilitation

## 2019-10-05 DIAGNOSIS — I639 Cerebral infarction, unspecified: Secondary | ICD-10-CM | POA: Insufficient documentation

## 2019-10-05 DIAGNOSIS — G8114 Spastic hemiplegia affecting left nondominant side: Secondary | ICD-10-CM | POA: Insufficient documentation

## 2019-10-05 DIAGNOSIS — I69354 Hemiplegia and hemiparesis following cerebral infarction affecting left non-dominant side: Secondary | ICD-10-CM | POA: Insufficient documentation

## 2019-10-05 DIAGNOSIS — I6930 Unspecified sequelae of cerebral infarction: Secondary | ICD-10-CM | POA: Insufficient documentation

## 2019-12-01 ENCOUNTER — Ambulatory Visit: Payer: Medicaid Other | Admitting: Podiatry

## 2020-01-19 ENCOUNTER — Encounter: Payer: Self-pay | Admitting: Podiatry

## 2020-01-19 ENCOUNTER — Ambulatory Visit (INDEPENDENT_AMBULATORY_CARE_PROVIDER_SITE_OTHER): Payer: Medicaid Other | Admitting: Podiatry

## 2020-01-19 ENCOUNTER — Other Ambulatory Visit: Payer: Self-pay

## 2020-01-19 DIAGNOSIS — L84 Corns and callosities: Secondary | ICD-10-CM | POA: Diagnosis not present

## 2020-01-19 DIAGNOSIS — M79671 Pain in right foot: Secondary | ICD-10-CM

## 2020-01-19 DIAGNOSIS — Q828 Other specified congenital malformations of skin: Secondary | ICD-10-CM | POA: Diagnosis not present

## 2020-01-19 DIAGNOSIS — M79674 Pain in right toe(s): Secondary | ICD-10-CM | POA: Diagnosis not present

## 2020-01-19 DIAGNOSIS — M79672 Pain in left foot: Secondary | ICD-10-CM

## 2020-01-19 DIAGNOSIS — B351 Tinea unguium: Secondary | ICD-10-CM | POA: Diagnosis not present

## 2020-01-19 DIAGNOSIS — M79675 Pain in left toe(s): Secondary | ICD-10-CM

## 2020-01-19 NOTE — Progress Notes (Signed)
Subjective: [name] presents today for follow up of painful mycotic nails b/l that are difficult to trim. Pain interferes with ambulation. Aggravating factors include wearing enclosed shoe gear. Pain is relieved with periodic professional debridement and painful plantar lesions b/l feet.  Pain prevent comfortable ambulation. Aggravating factor is weightbearing with or without shoegear.   He is s/p CVA with left sided weakness. On 81 mg ASA. Also has h/o gout.  He states his right foot porokeratosis (5th metatarsal base) is most symptomatic today. He had to cancel his June appointment and he can't remember why. He voices no other pedal complaints on today.  No Known Allergies   Objective: There were no vitals filed for this visit.  Pt is a pleasant 59 y.o. year old AA male  obese in NAD. AAO x 3.   Vascular Examination:  Capillary refill time to digits immediate b/l. Palpable DP pulses b/l. Palpable PT pulses b/l. Pedal hair absent b/l Skin temperature gradient within normal limits b/l.  Dermatological Examination: Pedal skin with normal turgor, texture and tone bilaterally. No open wounds bilaterally. No interdigital macerations bilaterally. Toenails L 3rd toe, L 4th toe, L 5th toe, R 3rd toe, R 4th toe and R 5th toe elongated, dystrophic, thickened, and crumbly with subungual debris and tenderness to dorsal palpation. Anonychia noted L hallux, L 2nd toe, R hallux and R 2nd toe. Nailbed(s) epithelialized.  Hyperkeratotic lesion(s) submet head 5 b/l.  No erythema, no edema, no drainage, no flocculence. Porokeratotic lesion(s) sub 5th met base right foot. No erythema, no edema, no drainage, no flocculence.  Musculoskeletal: Normal muscle strength 5/5 to all lower extremity muscle groups bilaterally. No pain crepitus or joint limitation noted with ROM b/l. No gross bony deformities bilaterally. Utilizes cane for ambulation assistance.  Neurological: Protective sensation intact 5/5 intact  bilaterally with 10g monofilament b/l Vibratory sensation intact b/l  Assessment: 1. Pain due to onychomycosis of toenails of both feet   2. Porokeratosis   3. Callus   4. Pain in both feet    Plan: -Medicaid ABN on file for 2021. Copy given to patient on today's visit and copy placed in patient chart. -Toenails 3-5 b/l debrided in length and girth without iatrogenic bleeding with sterile nail nipper and dremel.  -Callus(es) submet head 5 b/l, right hallux IPJ were debrided without complication or incident. Total number debrided =3. -Painful porokeratotic lesion(s) sub 5th met base right foot pared and enucleated with sterile scalpel blade without incident. -Felt horseshoe pad place in right insole to offload 5th met base lesion for right foot. -Patient to continue soft, supportive shoe gear daily. -Patient to report any pedal injuries to medical professional immediately. -Patient/POA to call should there be question/concern in the interim.  Return in about 4 months (around 05/21/2020) for nail trim.

## 2020-05-22 ENCOUNTER — Ambulatory Visit: Payer: Medicaid Other | Admitting: Podiatry

## 2020-06-12 ENCOUNTER — Encounter: Payer: Self-pay | Admitting: Podiatry

## 2020-06-12 ENCOUNTER — Ambulatory Visit: Payer: Medicaid Other | Admitting: Podiatry

## 2020-06-12 ENCOUNTER — Other Ambulatory Visit: Payer: Self-pay

## 2020-06-12 DIAGNOSIS — L84 Corns and callosities: Secondary | ICD-10-CM | POA: Diagnosis not present

## 2020-06-12 DIAGNOSIS — M79672 Pain in left foot: Secondary | ICD-10-CM

## 2020-06-12 DIAGNOSIS — M79674 Pain in right toe(s): Secondary | ICD-10-CM

## 2020-06-12 DIAGNOSIS — M79671 Pain in right foot: Secondary | ICD-10-CM | POA: Diagnosis not present

## 2020-06-12 DIAGNOSIS — B351 Tinea unguium: Secondary | ICD-10-CM | POA: Diagnosis not present

## 2020-06-12 DIAGNOSIS — M79675 Pain in left toe(s): Secondary | ICD-10-CM

## 2020-06-12 DIAGNOSIS — Q828 Other specified congenital malformations of skin: Secondary | ICD-10-CM

## 2020-06-12 NOTE — Progress Notes (Signed)
Subjective: "[name] have to do something about this foot. I'm in pain." [name] presents today for follow up of painful mycotic nails b/l that are difficult to trim. Pain interferes with ambulation. Aggravating factors include wearing enclosed shoe gear. Pain is relieved with periodic professional debridement and painful plantar lesions b/l feet.  Pain prevent comfortable ambulation. Aggravating factor is weightbearing with or without shoegear.   He is s/p CVA with left sided weakness. On 81 mg ASA. Also has h/o gout.  He continues to have pain sub 5th met base of the right foot. It is interfering with him walking his dogs. He would like to discuss other treatment options available other than periodic debridement and padding.   No Known Allergies   Objective: There were no vitals filed for this visit.  [name] is a pleasant 59 y.o. year old AA male obese in NAD. AAO x 3.   Vascular Examination:  Capillary refill time to digits immediate b/l. Palpable DP pulses b/l. Palpable PT pulses b/l. Pedal hair absent b/l Skin temperature gradient within normal limits b/l.  Dermatological Examination: Pedal skin with normal turgor, texture and tone bilaterally. No open wounds bilaterally. No interdigital macerations bilaterally. Toenails L 3rd toe, L 4th toe, L 5th toe, R 3rd toe, R 4th toe and R 5th toe elongated, discolored, dystrophic, thickened, and crumbly with subungual debris and tenderness to dorsal palpation. Anonychia noted L hallux, L 2nd toe, R hallux and R 2nd toe. Nailbed(s) epithelialized.  Hyperkeratotic lesion(s) R hallux.  No erythema, no edema, no drainage, no fluctuance. Porokeratotic lesion(s) sub 5th met base right foot. Exquisite tenderness to palpation. No erythema, no edema, no drainage, no fluctuance.  Musculoskeletal: Normal muscle strength 5/5 to all lower extremity muscle groups bilaterally. No pain crepitus or joint limitation noted with ROM b/l. No gross bony  deformities bilaterally. Utilizes cane for ambulation assistance.  Neurological: Protective sensation intact 5/5 intact bilaterally with 10g monofilament b/l Vibratory sensation intact b/l  Assessment: 1. Pain due to onychomycosis of toenails of both feet   2. Porokeratosis   3. Callus   4. Pain in both feet    Plan: -Medicaid ABN on file for 2021.   -Toenails 3-5 b/l debrided in length and girth without iatrogenic bleeding with sterile nail nipper and dremel.  -Callus(es) submet head 5 b/l, right hallux IPJ were debrided without complication or incident. Total number debrided =3. -Painful porokeratotic lesion(s) sub 5th met base right foot pared and enucleated with sterile scalpel blade and sterile currette without incident. -Patient referred to Dr. Lanae Crumbly to discuss other treatment options available for painful porokeratosis sub 5th met base right foot. -Patient to continue soft, supportive shoe gear daily. -Patient to report any pedal injuries to medical professional immediately. -Patient/POA to call should there be question/concern in the interim.  Return in about 3 months (around 09/10/2020) for toenail debridement w/corn(s)/callus(es).

## 2020-06-22 ENCOUNTER — Other Ambulatory Visit: Payer: Self-pay

## 2020-06-22 ENCOUNTER — Ambulatory Visit: Payer: Medicaid Other | Admitting: Podiatry

## 2020-06-22 DIAGNOSIS — Q828 Other specified congenital malformations of skin: Secondary | ICD-10-CM

## 2020-06-22 DIAGNOSIS — R52 Pain, unspecified: Secondary | ICD-10-CM

## 2020-06-22 NOTE — Patient Instructions (Signed)
Look for urea 40% cream or ointment and apply to the thickened dry skin / calluses. This can be bought over the counter, at a pharmacy or online such as [employer].   Salicylic acid 03% ointment or cream can also be helpful for these

## 2020-06-25 ENCOUNTER — Encounter: Payer: Self-pay | Admitting: Podiatry

## 2020-06-25 NOTE — Progress Notes (Signed)
  Subjective:  Patient ID: [name], male    DOB: 1960/08/13,  MRN: [id number]  Chief Complaint  Patient presents with  . Nail Problem    Pt stated he was referred by DR.Galaway for other treatment options.    59 y.o. male presents with the above complaint. History confirmed with patient.  He is referred to me from Dr. Elisha Ponder to evaluate for a porokeratosis under the fifth metatarsal base.  Objective:  Physical Exam: warm, good capillary refill, no trophic changes or ulcerative lesions, normal DP and PT pulses and normal sensory exam. Right Foot: Painful porokeratosis which is largely plantar to the fifth metatarsal base  Assessment:  No diagnosis found.   Plan:  Patient was evaluated and treated and all questions answered.  All symptomatic hyperkeratoses were safely debrided with a sterile #15 blade to patient's level of comfort without incident. We discussed preventative and palliative care of these lesions including supportive and accommodative shoegear, padding, prefabricated and custom molded accommodative orthoses, use of a pumice stone and lotions/creams daily.  Unfortunately this is a difficult location.  I do not think there is any surgical considerations for deformity correction to alleviate pressure in this area.  I discussed with him that excision of these lesions is a high risk of recurrence as well as the risk of infection or painful scar.  He would be at high risk for this due to his history of is 1/2 pack/day smoker.  I recommend he continue offloading with offloading pads, self-care at home with a pumice stone and urea cream.   Return if symptoms worsen or fail to improve.

## 2020-06-26 ENCOUNTER — Telehealth: Payer: Self-pay | Admitting: Podiatry

## 2020-06-26 MED ORDER — UREA 40 % EX CREA: 1.0000 "application " | TOPICAL_CREAM | Freq: Two times a day (BID) | CUTANEOUS | 6 refills | Status: AC

## 2020-06-26 NOTE — Telephone Encounter (Signed)
Pt is requesting urea cream. He would like a prescription since he is unable to come into the office to purchase. He is unable to order off of [facility] as well.

## 2020-06-26 NOTE — Addendum Note (Signed)
Addended byLilian Kapur, Ellean Firman R on: 06/26/2020 01:31 PM   Modules accepted: Orders

## 2020-07-07 ENCOUNTER — Encounter: Payer: Self-pay | Admitting: Physical Therapy

## 2020-08-25 ENCOUNTER — Other Ambulatory Visit: Payer: Self-pay | Admitting: Internal Medicine

## 2020-08-26 LAB — COMPLETE METABOLIC PANEL WITH GFR
AG Ratio: 1.1 (calc) (ref 1.0–2.5)
ALT: 11 U/L (ref 9–46)
AST: 10 U/L (ref 10–35)
Albumin: 3.7 g/dL (ref 3.6–5.1)
Alkaline phosphatase (APISO): 73 U/L (ref 35–144)
BUN: 9 mg/dL (ref 7–25)
CO2: 30 mmol/L (ref 20–32)
Calcium: 8.8 mg/dL (ref 8.6–10.3)
Chloride: 102 mmol/L (ref 98–110)
Creat: 0.86 mg/dL (ref 0.70–1.25)
GFR, Est African American: 108 mL/min/{1.73_m2} (ref >=60)
GFR, Est Non African American: 94 mL/min/{1.73_m2} (ref >=60)
Globulin: 3.3 g/dL (calc) (ref 1.9–3.7)
Glucose, Bld: 84 mg/dL (ref 65–99)
Potassium: 4.1 mmol/L (ref 3.5–5.3)
Sodium: 140 mmol/L (ref 135–146)
Total Bilirubin: 0.5 mg/dL (ref 0.2–1.2)
Total Protein: 7 g/dL (ref 6.1–8.1)

## 2020-08-26 LAB — CBC
HCT: 47.3 % (ref 38.5–50.0)
Hemoglobin: 14.8 g/dL (ref 13.2–17.1)
MCH: 25.9 pg — ABNORMAL LOW (ref 27.0–33.0)
MCHC: 31.3 g/dL — ABNORMAL LOW (ref 32.0–36.0)
MCV: 82.7 fL (ref 80.0–100.0)
MPV: 10.6 fL (ref 7.5–12.5)
Platelets: 249 10*3/uL (ref 140–400)
RBC: 5.72 10*6/uL (ref 4.20–5.80)
RDW: 18.2 % — ABNORMAL HIGH (ref 11.0–15.0)
WBC: 7.1 10*3/uL (ref 3.8–10.8)

## 2020-08-26 LAB — LIPID PANEL
Cholesterol: 182 mg/dL (ref <200)
HDL: 35 mg/dL — ABNORMAL LOW (ref >=40)
LDL Cholesterol (Calc): 127 mg/dL (calc) — ABNORMAL HIGH
Non-HDL Cholesterol (Calc): 147 mg/dL (calc) — ABNORMAL HIGH (ref <130)
Total CHOL/HDL Ratio: 5.2 (calc) — ABNORMAL HIGH (ref <5.0)
Triglycerides: 95 mg/dL (ref <150)

## 2020-08-26 LAB — URIC ACID: Uric Acid, Serum: 3.6 mg/dL — ABNORMAL LOW (ref 4.0–8.0)

## 2020-08-26 LAB — TSH: TSH: 2.39 mIU/L (ref 0.40–4.50)

## 2020-08-26 LAB — VITAMIN D 25 HYDROXY (VIT D DEFICIENCY, FRACTURES): Vit D, 25-Hydroxy: 27 ng/mL — ABNORMAL LOW (ref 30–100)

## 2020-09-11 ENCOUNTER — Ambulatory Visit: Payer: Medicaid Other | Admitting: Podiatry

## 2020-09-11 ENCOUNTER — Encounter: Payer: Self-pay | Admitting: Podiatry

## 2020-09-11 ENCOUNTER — Other Ambulatory Visit: Payer: Self-pay

## 2020-09-11 DIAGNOSIS — L84 Corns and callosities: Secondary | ICD-10-CM

## 2020-09-11 DIAGNOSIS — M79671 Pain in right foot: Secondary | ICD-10-CM | POA: Diagnosis not present

## 2020-09-11 DIAGNOSIS — Q828 Other specified congenital malformations of skin: Secondary | ICD-10-CM

## 2020-09-11 DIAGNOSIS — B351 Tinea unguium: Secondary | ICD-10-CM

## 2020-09-11 DIAGNOSIS — M79674 Pain in right toe(s): Secondary | ICD-10-CM

## 2020-09-11 DIAGNOSIS — M79672 Pain in left foot: Secondary | ICD-10-CM

## 2020-09-11 DIAGNOSIS — M79675 Pain in left toe(s): Secondary | ICD-10-CM | POA: Diagnosis not present

## 2020-09-11 NOTE — Progress Notes (Signed)
Subjective:  [name] presents today for follow up of painful mycotic nails b/l and painful plantar lesions b/l feet that are difficult to trim. Pain for both interferes with ambulation. Aggravating factors include wearing enclosed shoe gear. Pain is relieved with periodic professional debridement  Pain prevent comfortable ambulation.   He is s/p CVA with left sided weakness. On 81 mg ASA. Also has h/o gout.  He did see Dr. Sherryle Lis who recommended continuing conservative care. He was prescribed Urea Cream for the lesion and states the pharmacy wanted to charge him $62.00, but he was able to get it cheaper on [place].  No Known Allergies   Objective: There were no vitals filed for this visit.  [name] is a pleasant 60 y.o. year old AA male obese in NAD. AAO x 3.   Vascular Examination:  Capillary refill time to digits immediate b/l. Palpable DP pulses b/l. Palpable PT pulses b/l. Pedal hair absent b/l Skin temperature gradient within normal limits b/l.  Dermatological Examination: Pedal skin with normal turgor, texture and tone bilaterally. No open wounds bilaterally. No interdigital macerations bilaterally. Toenails L 3rd toe, L 4th toe, L 5th toe, R 3rd toe, R 4th toe and R 5th toe elongated, discolored, dystrophic, thickened, and crumbly with subungual debris and tenderness to dorsal palpation. Anonychia noted L hallux, L 2nd toe, R hallux and R 2nd toe. Nailbed(s) epithelialized.  Hyperkeratotic lesion(s) R hallux.  No erythema, no edema, no drainage, no fluctuance. Porokeratotic lesion(s) lateral 5th met base right foot. Exquisite tenderness to palpation. No erythema, no edema, no drainage, no fluctuance.  Musculoskeletal: Normal muscle strength 5/5 to all lower extremity muscle groups bilaterally. No pain crepitus or joint limitation noted with ROM b/l. No gross bony deformities bilaterally. Utilizes cane for ambulation assistance.  Neurological: Protective sensation intact 5/5  intact bilaterally with 10g monofilament b/l Vibratory sensation intact b/l  Assessment: 1. Pain due to onychomycosis of toenails of both feet   2. Porokeratosis   3. Callus   4. Pain in both feet    Plan: -Medicaid ABN on file for 2022. Copy was given to patient and copy to be scanned into chart. -Toenails 3-5 b/l debrided in length and girth without iatrogenic bleeding with sterile nail nipper and dremel.  -Callus(es) submet head 5 b/l, right hallux IPJ were debrided without complication or incident. Total number debrided =3. -Painful porokeratotic lesion(s) lateral 5th met base right foot pared and enucleated with sterile scalpel blade and sterile currette without incident. -Patient to continue soft, supportive shoe gear daily. -Patient to report any pedal injuries to medical professional immediately. -Patient/POA to call should there be question/concern in the interim.  Return in about 9 weeks (around 11/13/2020).

## 2020-12-07 ENCOUNTER — Other Ambulatory Visit: Payer: Self-pay

## 2020-12-07 ENCOUNTER — Encounter: Payer: Medicaid Other | Attending: Physical Medicine & Rehabilitation | Admitting: Physical Medicine & Rehabilitation

## 2020-12-07 ENCOUNTER — Encounter: Payer: Self-pay | Admitting: Physical Medicine & Rehabilitation

## 2020-12-07 VITALS — BP 144/81 | HR 99 | Temp 98.7°F | Ht 65.5 in | Wt 239.0 lb

## 2020-12-07 DIAGNOSIS — G8110 Spastic hemiplegia affecting unspecified side: Secondary | ICD-10-CM | POA: Diagnosis not present

## 2020-12-07 NOTE — Patient Instructions (Signed)

## 2020-12-07 NOTE — Progress Notes (Signed)
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 Has completed both inpt and outpt PT, OT with residual spasticity on the left which respond to botox  last injected in 2020 Informed consent was obtained after describing risks and benefits of the procedure with the patient. This includes bleeding, bruising, infection, excessive weakness, or medication side effects. A REMS form is on file and signed. Needle: 25g 2" needle electrode Number of units per muscle  Pecs 75U Biceps 50 Brachiorad 50 FCR 50 FCU 50 FDP 50 FDS 50 PT 25 All injections were done after obtaining appropriate EMG activity and after negative drawback for blood. The patient tolerated the procedure well. Post procedure instructions were given. A followup appointment was made.

## 2020-12-25 ENCOUNTER — Encounter: Payer: Self-pay | Admitting: Podiatry

## 2020-12-25 ENCOUNTER — Ambulatory Visit: Payer: Medicaid Other | Admitting: Podiatry

## 2020-12-25 ENCOUNTER — Other Ambulatory Visit: Payer: Self-pay

## 2020-12-25 DIAGNOSIS — M79674 Pain in right toe(s): Secondary | ICD-10-CM

## 2020-12-25 DIAGNOSIS — M79675 Pain in left toe(s): Secondary | ICD-10-CM

## 2020-12-25 DIAGNOSIS — B351 Tinea unguium: Secondary | ICD-10-CM

## 2020-12-25 NOTE — Progress Notes (Signed)
  Subjective:  Patient ID: [name], male    DOB: 1960-11-19,  MRN: [id number]  60 y.o. male presents painful porokeratotic lesion(s) b/l feet and painful mycotic toenails that limit ambulation. Painful toenails interfere with ambulation. Aggravating factors include wearing enclosed shoe gear. Pain is relieved with periodic professional debridement. Painful porokeratotic lesions are aggravated when weightbearing with and without shoegear. Pain is relieved with periodic professional debridement.  Patient states he has spoken to [employer] and was told they would pay for his painful calluses.   PCP is [name], MD , and last visit was last month per patient recall.  No Known Allergies  Review of Systems: Negative except as noted in the HPI.   Objective:  Neurovascular Examination: Vascular status intact b/l with palpable pedal pulses.  Pedal hair absent b/. No edema. No pain with calf compression b/l. Skin temperature gradient WNL b/l.   Protective sensation intact b/l with 10 gram monofilament. Vibratory sensation intact b/l.  Dermatological Examination: Pedal skin with normal turgor, texture and tone b/l.  Toenails 3-5 b/l thick, discolored, elongated with subungual debris and pain on dorsal palpation. Anonychia b/l great toes and bilateral 2nd toes. Nailbeds completely epithelialized.   Porokeratotic lesion sub 5th metatarsal base right foot. Hyperkeratotic lesions submetatarsal head 5 b/l and right hallux. No erythema, no edema, no drainage, no fluctuance of any lesions.  Musculoskeletal Examination: Muscle strength 5/5 to b/l LE. No gross bony deformities b/l. Utilizes cane for gait assistance.  Radiographs: None Assessment:   1. Pain due to onychomycosis of toenails of both feet    Plan:  Patient was evaluated and treated and all questions answered.  Onychomycosis with pain -Nails palliatively debridement as below. -Educated on self-care  Procedure: Nail  Debridement Rationale: Pain Type of Debridement: manual, sharp debridement. Instrumentation: Nail nipper, rotary burr. Number of Nails:6  -Examined patient. -Medicaid ABN signed for this year. Patient refuses services of paring of calluses/porokeratosis today. Patient/POA has copy and copy has been placed in patient chart. -Will forward this chart to billing department for clarification of coverage as Medicaid does not cover paring of calluses/corns. Patient instructed to continue pumice stone and urea cream to minimize lesions. -Patient to continue soft, supportive shoe gear daily. -Toenails 1-5 b/l were debrided in length and girth with sterile nail nippers and dremel without iatrogenic bleeding.  -Patient to report any pedal injuries to medical professional immediately. -Patient/POA to call should there be question/concern in the interim.  Return in about 9 weeks (around 02/26/2021).  Marzetta Board, DPM

## 2021-01-23 ENCOUNTER — Encounter: Payer: Self-pay | Admitting: Physical Medicine & Rehabilitation

## 2021-01-23 ENCOUNTER — Other Ambulatory Visit: Payer: Self-pay

## 2021-01-23 ENCOUNTER — Encounter: Payer: Medicaid Other | Attending: Physical Medicine & Rehabilitation | Admitting: Physical Medicine & Rehabilitation

## 2021-01-23 VITALS — BP 133/84 | HR 95 | Temp 98.4°F | Ht 65.5 in | Wt 232.4 lb

## 2021-01-23 DIAGNOSIS — I69354 Hemiplegia and hemiparesis following cerebral infarction affecting left non-dominant side: Secondary | ICD-10-CM | POA: Insufficient documentation

## 2021-01-23 NOTE — Progress Notes (Signed)
Subjective:    Patient ID: [name], male    DOB: 08-Jul-1960, 60 y.o.   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  The patient underwent botulinum toxin injection left upper extremity to the elbow flexors wrist flexors finger flexors as well as the pectoralis.  The patient states that he did well with his shoulder and his elbow and wrist but his fingers are not any better. No adverse events from injection. Has tried both baclofen and Zanaflex without much improvement. Pain Inventory Average Pain 0 Pain Right Now 0 My pain is  no pain  In the last 24 hours, has pain interfered with the following? General activity 0 Relation with others 0 Enjoyment of life 0 What TIME of day is your pain at its worst?  Sleep (in general) Good  Pain is worse with:  no pain Pain improves with:  no pain Relief from Meds:  no pain  Family History  Problem Relation Age of Onset   Brain cancer Maternal Grandfather    Social History   Socioeconomic History   Marital status: Single    Spouse name: Not on file   Number of children: 0   Years of education: Not on file   Highest education level: Not on file  Occupational History    Comment: disability  Tobacco Use   Smoking status: Every Day    Packs/day: 0.50    Years: 15.00    Pack years: 7.50    Types: Cigarettes   Smokeless tobacco: Never  Vaping Use   Vaping Use: Never used  Substance and Sexual Activity   Alcohol use: No   Drug use: No   Sexual activity: Not on file  Other Topics Concern   Not on file  Social History Narrative   Not on file   Social Determinants of Health   Financial Resource Strain: Not on file  Food Insecurity: Not on file  Transportation Needs: Not on file  Physical Activity: Not on file  Stress: Not on file  Social Connections: Not on file   Past Surgical History:  Procedure Laterality Date   COLONOSCOPY WITH PROPOFOL N/A 09/27/2015    Procedure: COLONOSCOPY WITH PROPOFOL;  Surgeon: [name], MD;  Location: [facility];  Service: Endoscopy;  Laterality: N/A;   CYSTOSCOPY N/A 10/05/2012   Procedure: CYSTOSCOPY, retrograde and right stent placement;  Surgeon: [name], MD;  Location: [facility];  Service: Urology;  Laterality: N/A;   CYSTOSCOPY WITH RETROGRADE PYELOGRAM, URETEROSCOPY AND STENT PLACEMENT Right 04/13/2014   Procedure: CYSTOSCOPY WITH RETROGRADE PYELOGRAM, URETEROSCOPY AND STENT PLACEMENT, LASER ABLATION OF SCAR TISSUE;  Surgeon: [name], MD;  Location: [facility];  Service: Urology;  Laterality: Right;   EYE SURGERY Left age 72   HOLMIUM LASER APPLICATION Right [id number]   Procedure: HOLMIUM LASER APPLICATION;  Surgeon: [name], MD;  Location: [facility];  Service: Urology;  Laterality: Right;   NEPHROLITHOTOMY Right 10/05/2012   Procedure: NEPHROLITHOTOMY PERCUTANEOUS;  Surgeon: [name], MD;  Location: [facility];  Service: Urology;  Laterality: Right;  WITH CYSTO AND SURGEON ACCESS    NEPHROLITHOTOMY Right 10/07/2012   Procedure: NEPHROLITHOTOMY PERCUTANEOUS SECOND LOOK. Stent exchange,Ureteroscopy ;  Surgeon: [name], MD;  Location: [facility];  Service: Urology;  Laterality: Right;   RADIOACTIVE SEED IMPLANT N/A 11/12/2018   Procedure: RADIOACTIVE SEED IMPLANT/BRACHYTHERAPY IMPLANT;  Surgeon: [name], MD;  Location: [facility];  Service: Urology;  Laterality: N/A;  90  MINS   SPACE OAR INSTILLATION N/A 11/12/2018   Procedure: SPACE OAR INSTILLATION;  Surgeon: [name], MD;  Location: [facility];  Service: Urology;  Laterality: N/A;   TOENAIL EXCISION     Past Surgical History:  Procedure Laterality Date   COLONOSCOPY WITH PROPOFOL N/A 09/27/2015   Procedure: COLONOSCOPY WITH PROPOFOL;  Surgeon: [name], MD;  [facility]: [facility];  Service: Endoscopy;  Laterality: N/A;   CYSTOSCOPY N/A 10/05/2012   Procedure: CYSTOSCOPY, retrograde and right stent placement;  Surgeon: [name],  MD;  Location: [facility];  Service: Urology;  Laterality: N/A;   CYSTOSCOPY WITH RETROGRADE PYELOGRAM, URETEROSCOPY AND STENT PLACEMENT Right 04/13/2014   Procedure: CYSTOSCOPY WITH RETROGRADE PYELOGRAM, URETEROSCOPY AND STENT PLACEMENT, LASER ABLATION OF SCAR TISSUE;  Surgeon: [name], MD;  Location[facility];  Service: Urology;  Laterality: Right;   EYE SURGERY Left age 7   HOLMIUM LASER APPLICATION Right [id number]   Procedure: HOLMIUM LASER APPLICATION;  Surgeon: [name], MD;  Location: [facility];  Service: Urology;  Laterality: Right;   NEPHROLITHOTOMY Right 10/05/2012   Procedure: NEPHROLITHOTOMY PERCUTANEOUS;  Surgeon: [name], MD;  Location: [facility];  Service: Urology;  Laterality: Right;  WITH CYSTO AND SURGEON ACCESS    NEPHROLITHOTOMY Right 10/07/2012   Procedure: NEPHROLITHOTOMY PERCUTANEOUS SECOND LOOK. 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   History of gout    History of kidney stones    Hyperlipidemia    Hypertension    Prostate cancer [facility]) urologist-- dr [name];  oncologist-  dr [name]   dx 07-20-2017   Renal calculi    Stroke ([place]) sept 26,  20006     hx mini strokes in past, 1 large stroke   Weakness of left side of body 03/2005   cva residual   Weakness of one side of body    L sided weakness after stroke   BP 133/84   Pulse 95   Temp 98.4 F (36.9 C)   Ht 5' 5.5" (1.664 m)   Wt 232 lb 6.4 oz (105.4 kg)   SpO2 92%   BMI 38.09 kg/m   Opioid Risk Score:   Fall Risk Score:  `1  Depression screen PHQ 2/9  Depression screen PHQ 2/9 01/23/2021  Decreased Interest 0  Down, Depressed, Hopeless 0  PHQ - 2 Score 0      Review of Systems  Constitutional: Negative.   HENT: Negative.    Eyes: Negative.   Respiratory: Negative.    Cardiovascular: Negative.   Gastrointestinal: Negative.   Endocrine: Negative.   Genitourinary: Negative.   Musculoskeletal:  Positive for gait problem.  Skin: Negative.  Allergic/Immunologic: Negative.   Neurological:  Positive for weakness.  Hematological: Negative.   Psychiatric/Behavioral: Negative.    All other systems reviewed and are negative.     Objective:   Physical Exam Vitals and nursing note reviewed.  Constitutional:      Appearance: He is obese.  HENT:     Head: Normocephalic and atraumatic.  Eyes:     Extraocular Movements: Extraocular movements intact.     Conjunctiva/sclera: Conjunctivae normal.     Pupils: Pupils are equal, round, and reactive to light.  Neurological:     Mental Status: He is alert and oriented to person, place, and time.     Cranial Nerves: Dysarthria present.     Motor: Abnormal muscle tone present.     Gait: Gait abnormal.     Comments: Contracture at the PIPs digits 2 through 5 on the left side. Tone Left elbow MAS 1 Left wrist flexor MAS 2 Left pectoralis MAS 2 Left finger flexors with joint contracture unable to grade  Psychiatric:        Mood and Affect: Mood normal.        Behavior: Behavior normal.  #1.        Assessment & Plan:  1.  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  His spasticity persist despite oral medications  as well as stretching program. His finger flexion has not improved because there is underlying contracture of the fingers.  Do not think Botox would be helpful for this.  We will continue Botox into the other muscle groups. Revised dosing as below.  Repeat in 6 weeks Botox   Left Pec  75 Biceps 50 Brachiorad 50 Pro teres 25 FCR 50 FCU 50

## 2021-03-19 ENCOUNTER — Ambulatory Visit: Payer: Medicaid Other | Admitting: Podiatry

## 2021-03-19 ENCOUNTER — Other Ambulatory Visit: Payer: Self-pay

## 2021-03-19 ENCOUNTER — Encounter: Payer: Self-pay | Admitting: Podiatry

## 2021-03-19 DIAGNOSIS — B351 Tinea unguium: Secondary | ICD-10-CM

## 2021-03-19 DIAGNOSIS — M79675 Pain in left toe(s): Secondary | ICD-10-CM

## 2021-03-19 DIAGNOSIS — Q828 Other specified congenital malformations of skin: Secondary | ICD-10-CM | POA: Diagnosis not present

## 2021-03-19 DIAGNOSIS — M79674 Pain in right toe(s): Secondary | ICD-10-CM | POA: Diagnosis not present

## 2021-03-19 DIAGNOSIS — M79672 Pain in left foot: Secondary | ICD-10-CM

## 2021-03-19 DIAGNOSIS — M79671 Pain in right foot: Secondary | ICD-10-CM

## 2021-03-24 NOTE — Progress Notes (Signed)
  Subjective:  Patient ID: [name], male    DOB: [birth year],  MRN: [id number]  60 y.o. male presents painful porokeratotic lesion(s) right foot and painful mycotic toenails that limit ambulation. Painful toenails interfere with ambulation. Aggravating factors include wearing enclosed shoe gear. Pain is relieved with periodic professional debridement. Painful porokeratotic lesions are aggravated when weightbearing with and without shoegear. Pain is relieved with periodic professional debridement.  He voices no new pedal problems on today's visit  PCP is [name], MD , and last visit was 12/25/2020.  No Known Allergies  Review of Systems: Negative except as noted in the HPI.   Objective:  Vascular Examination: Vascular status intact b/l with palpable pedal pulses. CFT immediate b/l. No edema. No pain with calf compression b/l. Skin temperature gradient WNL b/l.   Neurological Examination: Sensation grossly intact b/l with 10 gram monofilament. Vibratory sensation intact b/l.   Dermatological Examination: Pedal skin with normal turgor, texture and tone b/l. Toenails 3-5 b/l thick, discolored, elongated with subungual debris and pain on dorsal palpation. No hyperkeratotic lesions noted b/l. Anonychia noted L hallux, L 2nd toe, R hallux, and R 2nd toe. Nailbed(s) epithelialized.  Porokeratotic lesion(s) R hallux, submet head 5 left foot, sub 5th met base right foot, and plantar aspect of heel right foot. No erythema, no edema, no drainage, no fluctuance.  Musculoskeletal Examination: Muscle strength 5/5 to b/l LE.Utilizes can for gait assistance. Pes planus foot deformity b/l.  Radiographs: None Assessment:   1. Pain due to onychomycosis of toenails of both feet   2. Porokeratosis   3. Pain in both feet    Plan:  -Examined patient. -Medicaid ABN signed for this year. Patient consents for services of paring of porokeratotic lesions today. Copy has been placed in patient  chart. -Patient to continue soft, supportive shoe gear daily. -Toenails 1-5 b/l were debrided in length and girth with sterile nail nippers and dremel without iatrogenic bleeding.  -Painful porokeratotic lesion(s) R hallux, submet head 5 left foot, sub 5th met base right foot, and plantar aspect of heel right foot pared and enucleated with sterile scalpel blade without incident. Total number of lesions debrided=4. -Patient to report any pedal injuries to medical professional immediately. -Patient/POA to call should there be question/concern in the interim.  Return in about 9 weeks (around 05/21/2021).  Marzetta Board, DPM

## 2021-03-27 ENCOUNTER — Encounter: Payer: Medicaid Other | Admitting: Physical Medicine & Rehabilitation

## 2021-04-03 ENCOUNTER — Encounter: Payer: Medicaid Other | Attending: Physical Medicine & Rehabilitation | Admitting: Physical Medicine & Rehabilitation

## 2021-04-03 ENCOUNTER — Encounter: Payer: Self-pay | Admitting: Physical Medicine & Rehabilitation

## 2021-04-03 ENCOUNTER — Other Ambulatory Visit: Payer: Self-pay

## 2021-04-03 VITALS — BP 120/75 | HR 98 | Temp 98.6°F | Ht 65.5 in | Wt 230.6 lb

## 2021-04-03 DIAGNOSIS — I639 Cerebral infarction, unspecified: Secondary | ICD-10-CM | POA: Diagnosis present

## 2021-04-03 DIAGNOSIS — G8114 Spastic hemiplegia affecting left nondominant side: Secondary | ICD-10-CM

## 2021-04-03 NOTE — Progress Notes (Signed)
Botox Injection for spasticity using needle EMG guidance  Dilution: 50 Units/ml Indication: Severe spasticity which interferes with ADL,mobility and/or  hygiene and is unresponsive to medication management and other conservative care Informed consent was obtained after describing risks and benefits of the procedure with the patient. This includes bleeding, bruising, infection, excessive weakness, or medication side effects. A REMS form is on file and signed. Needle: 49mm 25g needle electrode Number of units per muscle Left Pec  75 Biceps 50 Brachiorad 50 Pro teres 25 FCR 50 FCU 50 All injections were done after obtaining appropriate EMG activity and after negative drawback for blood. The patient tolerated the procedure well. Post procedure instructions were given. A followup appointment was made.

## 2021-04-03 NOTE — Patient Instructions (Signed)

## 2021-05-21 ENCOUNTER — Ambulatory Visit: Payer: Medicaid Other | Admitting: Podiatry

## 2021-05-21 ENCOUNTER — Other Ambulatory Visit: Payer: Self-pay

## 2021-05-21 ENCOUNTER — Encounter: Payer: Self-pay | Admitting: Podiatry

## 2021-05-21 DIAGNOSIS — E119 Type 2 diabetes mellitus without complications: Secondary | ICD-10-CM

## 2021-05-21 DIAGNOSIS — M2142 Flat foot [pes planus] (acquired), left foot: Secondary | ICD-10-CM

## 2021-05-21 DIAGNOSIS — Q828 Other specified congenital malformations of skin: Secondary | ICD-10-CM

## 2021-05-21 DIAGNOSIS — M79675 Pain in left toe(s): Secondary | ICD-10-CM

## 2021-05-21 DIAGNOSIS — B351 Tinea unguium: Secondary | ICD-10-CM | POA: Diagnosis not present

## 2021-05-21 DIAGNOSIS — M79674 Pain in right toe(s): Secondary | ICD-10-CM

## 2021-05-21 DIAGNOSIS — M2141 Flat foot [pes planus] (acquired), right foot: Secondary | ICD-10-CM

## 2021-05-21 DIAGNOSIS — R7303 Prediabetes: Secondary | ICD-10-CM

## 2021-05-21 DIAGNOSIS — M79672 Pain in left foot: Secondary | ICD-10-CM

## 2021-05-21 DIAGNOSIS — M79671 Pain in right foot: Secondary | ICD-10-CM

## 2021-05-21 NOTE — Progress Notes (Signed)
ANNUAL DIABETIC FOOT EXAM  Subjective: ILYA NEELY presents today for painful porokeratotic lesion(s) of the right foot and painful mycotic toenails that limit ambulation. Painful toenails interfere with ambulation. Aggravating factors include wearing enclosed shoe gear. Pain is relieved with periodic professional debridement. Painful porokeratotic lesions are aggravated when weightbearing with and without shoegear. Pain is relieved with periodic professional debridement..  Patient relates 4 year h/o prediabetes.  Patient denies any h/o foot wounds.  Patient endorses symptoms of burning in feet.  Patient does not monitor blood glucose daily.  [name], MD is patient's PCP. Last visit was October, 2022.  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   History of gout    History of kidney stones    Hyperlipidemia    Hypertension    Prostate cancer [facility]) urologist-- dr [name];  oncologist-  dr [name]   dx 07-20-2017   Renal calculi    Stroke ([place]) sept 26,  20006    hx mini strokes in past, 1 large stroke   Weakness of left side of body 03/2005   cva residual   Weakness of one side of body    L sided weakness after stroke   Patient Active Problem List   Diagnosis Date Noted   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   Spastic hemiparesis of left nondominant side due to acute cerebral infarction ([place]) 04/03/2021   Malignant neoplasm of prostate ([place]) 08/19/2018   Past Surgical History:  Procedure Laterality Date   COLONOSCOPY WITH PROPOFOL N/A 09/27/2015   Procedure: COLONOSCOPY WITH PROPOFOL;  Surgeon: [name], MD;  [facility];  Service: Endoscopy;   Laterality: N/A;   CYSTOSCOPY N/A 10/05/2012   Procedure: CYSTOSCOPY, retrograde and right stent placement;  Surgeon: [name], MD;  [facility];  Service: Urology;  Laterality: N/A;   CYSTOSCOPY WITH RETROGRADE PYELOGRAM, URETEROSCOPY AND STENT PLACEMENT Right 04/13/2014   Procedure: CYSTOSCOPY WITH RETROGRADE PYELOGRAM, URETEROSCOPY AND STENT PLACEMENT, LASER ABLATION OF SCAR TISSUE;  Surgeon: [name], MD;  [facility];  Service: Urology;  Laterality: Right;   EYE SURGERY Left age 74   HOLMIUM LASER APPLICATION Right 88/50/2774   Procedure: HOLMIUM LASER APPLICATION;  Surgeon: [name], MD;  [facility];  Service: Urology;  Laterality: Right;   NEPHROLITHOTOMY Right 10/05/2012   Procedure: NEPHROLITHOTOMY PERCUTANEOUS;  Surgeon: [name], MD;  [facility];  Service: Urology;  Laterality: Right;  WITH CYSTO AND SURGEON ACCESS    NEPHROLITHOTOMY Right 10/07/2012   Procedure: NEPHROLITHOTOMY PERCUTANEOUS SECOND LOOK. Stent exchange,Ureteroscopy ;  Surgeon: [name], MD;  [facility];  Service: Urology;  Laterality: Right;   RADIOACTIVE SEED IMPLANT N/A 11/12/2018   Procedure: RADIOACTIVE SEED IMPLANT/BRACHYTHERAPY IMPLANT;  Surgeon: [name], MD;  [facility];  Service: Urology;  Laterality: N/A;  [place] N/A 11/12/2018   Procedure: SPACE OAR INSTILLATION;  Surgeon: [name], MD;  [facility];  Service: Urology;  Laterality: N/A;   TOENAIL EXCISION     Current Outpatient Medications on File Prior to Visit  Medication Sig Dispense Refill   albuterol (PROVENTIL HFA;VENTOLIN HFA) 108 (90 BASE)  MCG/ACT inhaler Inhale 1-2 puffs into the lungs every 6 (six) hours as needed for wheezing or shortness of breath. Reported on 07/31/2015     allopurinol (ZYLOPRIM) 300 MG tablet Take 300 mg by mouth every morning.      aspirin EC 81 MG tablet Take 81 mg by mouth every morning.      baclofen (LIORESAL) 10 MG  tablet Take 10 mg by mouth 3 (three) times daily.      baclofen (LIORESAL) 20 MG tablet Take 20 mg by mouth 3 (three) times daily.     budesonide-formoterol (SYMBICORT) 160-4.5 MCG/ACT inhaler Inhale 2 puffs into the lungs 2 (two) times daily.     cephALEXin (KEFLEX) 500 MG capsule Take 500 mg by mouth 2 (two) times daily.     cetirizine (ZYRTEC) 10 MG tablet Take 10 mg by mouth every morning. Reported on 07/31/2015     chlorhexidine (PERIDEX) 0.12 % solution RINSE WITH 15ML FOR 30 SECONDS AND SPIT USE TWICE DAILY IN THE MORNING AND AFTERNOON AFTER BRUSHING. DO NOT SWALLOW     clotrimazole-betamethasone (LOTRISONE) cream Apply 1 application topically 2 (two) times daily.     diclofenac sodium (VOLTAREN) 1 % GEL Apply 1 application topically 3 (three) times daily as needed (pain).     diclofenac Sodium (VOLTAREN) 1 % GEL SMARTSIG:4 Gram(s) Topical 4 Times Daily PRN     fluticasone (FLONASE) 50 MCG/ACT nasal spray Place into both nostrils.     furosemide (LASIX) 20 MG tablet Take 20 mg by mouth daily at 10 pm. Reported on 07/31/2015     HYDROcodone-acetaminophen (NORCO/VICODIN) 5-325 MG tablet Take 1-2 tablets by mouth every 6 (six) hours as needed for moderate pain or severe pain. 20 tablet 0   hydrOXYzine (ATARAX/VISTARIL) 25 MG tablet Take 25 mg by mouth 3 (three) times daily as needed.     losartan-hydrochlorothiazide (HYZAAR) 100-12.5 MG tablet Take 1 tablet by mouth daily.     Multiple Vitamin (MULTIVITAMIN WITH MINERALS) TABS tablet Take 1 tablet by mouth every morning.     pantoprazole (PROTONIX) 40 MG tablet Take 40 mg by mouth daily.     potassium citrate (UROCIT-K) 10 MEQ (1080 MG) SR tablet Take 10 mEq by mouth 2 (two) times daily at 10 AM and 5 PM. Reported on 07/31/2015     predniSONE (DELTASONE) 20 MG tablet TAKE 2 TABLETS BY MOUTH ONCE DAILY FOR 3 DAYS THEN TAKE 1 DAILY FOR 4 DAYS     simvastatin (ZOCOR) 20 MG tablet Take 20 mg by mouth at bedtime.     tamsulosin (FLOMAX) 0.4 MG CAPS  capsule Take 0.4 mg by mouth daily.     tiZANidine (ZANAFLEX) 4 MG tablet Take 4 mg by mouth 2 (two) times daily as needed.     traMADol (ULTRAM) 50 MG tablet Take 50 mg by mouth every 6 (six) hours as needed.     traZODone (DESYREL) 50 MG tablet Take 50 mg by mouth at bedtime.     urea (CARMOL) 40 % CREA Apply 1 application topically 2 (two) times daily. 120 g 6   No current facility-administered medications on file prior to visit.    No Known Allergies Social History   Occupational History    Comment: disability  Tobacco Use   Smoking status: Every Day    Packs/day: 0.50    Years: 15.00    Pack years: 7.50    Types: Cigarettes   Smokeless tobacco: Never  Vaping Use   Vaping Use:  Never used  Substance and Sexual Activity   Alcohol use: No   Drug use: No   Sexual activity: Not on file   Family History  Problem Relation Age of Onset   Brain cancer Maternal Grandfather    Immunization History  Administered Date(s) Administered   Pneumococcal Polysaccharide-23 10/06/2012     Review of Systems: Negative except as noted in the HPI.   Objective: There were no vitals filed for this visit.  HAYVEN FATIMA is a pleasant 60 y.o. male in NAD. AAO X 3.  Vascular Examination: CFT immediate b/l LE. Palpable DP/PT pulses b/l LE. Digital hair sparse b/l. Skin temperature gradient WNL b/l. No pain with calf compression b/l. No edema noted b/l. No cyanosis or clubbing noted b/l LE.  Dermatological Examination: Pedal integument with normal turgor, texture and tone BLE. No open wounds b/l LE. No interdigital macerations noted b/l LE. Toenails L 3rd toe, L 4th toe, L 5th toe, R 3rd toe, R 4th toe, and R 5th toe elongated, discolored, dystrophic, thickened, and crumbly with subungual debris and tenderness to dorsal palpation. Anonychia noted L hallux, L 2nd toe, R hallux, and R 2nd toe. Nailbed(s) epithelialized.  Porokeratotic lesion(s) R hallux, submet head 5 right foot, and sub 5th met  base b/l lower extremities. No erythema, no edema, no drainage, no fluctuance.  Musculoskeletal Examination: Muscle strength 5/5 to all lower extremity muscle groups bilaterally. Pes planus deformity noted bilateral LE. Utilizes cane for ambulation assistance.  Footwear Assessment: Does the patient wear appropriate shoes? Yes. Does the patient need inserts/orthotics? No.  Neurological Examination: Protective sensation intact 5/5 intact bilaterally with 10g monofilament b/l. Vibratory sensation intact b/l.  Assessment: 1. Pain due to onychomycosis of toenails of both feet   2. Porokeratosis   3. Pain in both feet   4. Prediabetes   5. Pes planus of both feet   6. Encounter for diabetic foot exam ([place])     ADA Risk Categorization: Low Risk :  Patient has all of the following: Intact protective sensation No prior foot ulcer  No severe deformity Pedal pulses present  Plan: -Examined patient. -Medicaid ABN on file for paring of porokeratoses right foot. -Diabetic foot examination performed today. -Patient to continue soft, supportive shoe gear daily. -Mycotic toenails L 3rd toe, L 4th toe, L 5th toe, R 3rd toe, R 4th toe, and R 5th toe were debrided in length and girth with sterile nail nippers and dremel without iatrogenic bleeding. -Painful porokeratotic lesion(s) R hallux, submet head 5 right foot, and sub 5th met base right foot pared and enucleated with sterile scalpel blade without incident. Total number of lesions debrided=3. -Patient/POA to call should there be question/concern in the interim.  Return in about 3 months (around 08/21/2021).  Marzetta Board, DPM

## 2021-05-25 DIAGNOSIS — Q828 Other specified congenital malformations of skin: Secondary | ICD-10-CM | POA: Insufficient documentation

## 2021-05-31 ENCOUNTER — Encounter: Payer: Self-pay | Admitting: Physical Medicine & Rehabilitation

## 2021-05-31 ENCOUNTER — Encounter: Payer: Medicaid Other | Attending: Physical Medicine & Rehabilitation | Admitting: Physical Medicine & Rehabilitation

## 2021-05-31 ENCOUNTER — Other Ambulatory Visit: Payer: Self-pay

## 2021-05-31 VITALS — BP 153/91 | HR 88 | Temp 97.7°F | Ht 65.5 in | Wt 231.8 lb

## 2021-05-31 DIAGNOSIS — G8114 Spastic hemiplegia affecting left nondominant side: Secondary | ICD-10-CM | POA: Insufficient documentation

## 2021-05-31 DIAGNOSIS — I639 Cerebral infarction, unspecified: Secondary | ICD-10-CM | POA: Diagnosis not present

## 2021-05-31 NOTE — Progress Notes (Signed)
Subjective:    Patient ID: [name], male    DOB: Dec 03, 1960, 60 y.o.   MRN: [id number]  HPI   10/4 Botox Left Pec  75 Biceps 50 Brachiorad 50 Pro teres 25 FCR 50 FCU 50  Pt feels finger flexion spasticity is worse , had FDS, FDP injections in June with only partial relief Pain Inventory Average Pain 0 Pain Right Now 0 My pain is  no pain  In the last 24 hours, has pain interfered with the following? General activity 0 Relation with others 0 Enjoyment of life 0 What TIME of day is your pain at its worst? No pain Sleep (in general) Fair  Pain is worse with:  no pain Pain improves with:  no pain Relief from Meds:  no pain  Family History  Problem Relation Age of Onset   Brain cancer Maternal Grandfather    Social History   Socioeconomic History   Marital status: Single    Spouse name: Not on file   Number of children: 0   Years of education: Not on file   Highest education level: Not on file  Occupational History    Comment: disability  Tobacco Use   Smoking status: Every Day    Packs/day: 0.50    Years: 15.00    Pack years: 7.50    Types: Cigarettes   Smokeless tobacco: Never  Vaping Use   Vaping Use: Never used  Substance and Sexual Activity   Alcohol use: No   Drug use: No   Sexual activity: Not on file  Other Topics Concern   Not on file  Social History Narrative   Not on file   Social Determinants of Health   Financial Resource Strain: Not on file  Food Insecurity: Not on file  Transportation Needs: Not on file  Physical Activity: Not on file  Stress: Not on file  Social Connections: Not on file   Past Surgical History:  Procedure Laterality Date   COLONOSCOPY WITH PROPOFOL N/A 09/27/2015   Procedure: COLONOSCOPY WITH PROPOFOL;  Surgeon: [name], MD;  [facility];  Service: Endoscopy;  Laterality: N/A;   CYSTOSCOPY N/A 10/05/2012   Procedure: CYSTOSCOPY, retrograde and right stent placement;  Surgeon: [name], MD;  [facility];  Service: Urology;  Laterality: N/A;   CYSTOSCOPY WITH RETROGRADE PYELOGRAM, URETEROSCOPY AND STENT PLACEMENT Right 04/13/2014   Procedure: CYSTOSCOPY WITH RETROGRADE PYELOGRAM, URETEROSCOPY AND STENT PLACEMENT, LASER ABLATION OF SCAR TISSUE;  Surgeon: [name], MD;  [facility];  Service: Urology;  Laterality: Right;   EYE SURGERY Left age 31   HOLMIUM LASER APPLICATION Right 58/03/9832   Procedure: HOLMIUM LASER APPLICATION;  Surgeon: [name], MD;  [facility];  Service: Urology;  Laterality: Right;   NEPHROLITHOTOMY Right 10/05/2012   Procedure: NEPHROLITHOTOMY PERCUTANEOUS;  Surgeon: [name], MD;  [facility];  Service: Urology;  Laterality: Right;  WITH CYSTO AND SURGEON ACCESS    NEPHROLITHOTOMY Right 10/07/2012   Procedure: NEPHROLITHOTOMY PERCUTANEOUS SECOND LOOK. Stent exchange,Ureteroscopy ;  Surgeon: [name], MD;  [facility];  Service: Urology;  Laterality: Right;   RADIOACTIVE SEED IMPLANT N/A 11/12/2018   Procedure: RADIOACTIVE SEED IMPLANT/BRACHYTHERAPY IMPLANT;  Surgeon: [name], MD;  [facility];  Service: Urology;  Laterality: N/A;  [place] N/A 11/12/2018   Procedure: SPACE OAR INSTILLATION;  Surgeon: [name], MD;  [facility];  Service: Urology;  Laterality: N/A;   TOENAIL  EXCISION     Past Surgical History:  Procedure Laterality Date   COLONOSCOPY WITH PROPOFOL N/A 09/27/2015   Procedure: COLONOSCOPY WITH PROPOFOL;  Surgeon: [name], MD;  [facility];  Service: Endoscopy;  Laterality: N/A;   CYSTOSCOPY N/A 10/05/2012   Procedure: CYSTOSCOPY, retrograde and right stent placement;  Surgeon: [name], MD;  [facility];  Service: Urology;  Laterality: N/A;   CYSTOSCOPY WITH RETROGRADE PYELOGRAM, URETEROSCOPY AND STENT PLACEMENT Right 04/13/2014   Procedure: CYSTOSCOPY WITH RETROGRADE PYELOGRAM, URETEROSCOPY AND STENT PLACEMENT,  LASER ABLATION OF SCAR TISSUE;  Surgeon: [name], MD;  [facility];  Service: Urology;  Laterality: Right;   EYE SURGERY Left age 74   HOLMIUM LASER APPLICATION Right 11/91/4782   Procedure: HOLMIUM LASER APPLICATION;  Surgeon: [name], MD;  [facility];  Service: Urology;  Laterality: Right;   NEPHROLITHOTOMY Right 10/05/2012   Procedure: NEPHROLITHOTOMY PERCUTANEOUS;  Surgeon: [name], MD;  [facility];  Service: Urology;  Laterality: Right;  WITH CYSTO AND SURGEON ACCESS    NEPHROLITHOTOMY Right 10/07/2012   Procedure: NEPHROLITHOTOMY PERCUTANEOUS SECOND LOOK. 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   History of gout    History of kidney stones    Hyperlipidemia    Hypertension    Prostate cancer [facility]) urologist-- dr [name];  oncologist-  dr [name]   dx 07-20-2017   Renal calculi    Stroke ([place]) sept 26,  20006    hx mini strokes in past, 1 large stroke   Weakness of left side of body 03/2005   cva residual   Weakness of one side of body    L sided weakness after stroke   BP (!) 153/91   Pulse 88   Temp 97.7 F (36.5 C)   Ht 5' 5.5"  (1.664 m)   Wt 231 lb 12.8 oz (105.1 kg)   SpO2 94%   BMI 37.99 kg/m   Opioid Risk Score:   Fall Risk Score:  `1  Depression screen PHQ 2/9  Depression screen [facility] 2/9 05/31/2021 01/23/2021  Decreased Interest 0 0  Down, Depressed, Hopeless 0 0  PHQ - 2 Score 0 0     Review of Systems  Constitutional: Negative.   HENT: Negative.    Eyes: Negative.   Respiratory: Negative.    Cardiovascular: Negative.   Gastrointestinal: Negative.   Endocrine: Negative.   Genitourinary: Negative.   Musculoskeletal:  Positive for gait problem.  Skin: Negative.   Allergic/Immunologic: Negative.   Neurological:  Positive for weakness.       Left side  Hematological: Negative.   Psychiatric/Behavioral: Negative.  All other systems reviewed and are negative.     Objective:   Physical Exam Vitals and nursing note reviewed.  Constitutional:      Appearance: He is obese.  HENT:     Head: Normocephalic and atraumatic.  Eyes:     Extraocular Movements: Extraocular movements intact.     Conjunctiva/sclera: Conjunctivae normal.     Pupils: Pupils are equal, round, and reactive to light.  Skin:    General: Skin is warm and dry.  Neurological:     Mental Status: He is alert and oriented to person, place, and time.  Psychiatric:        Mood and Affect: Mood normal.        Behavior: Behavior normal.     MAS 2 in wrist , elbow flexors, an dpec 4 at digits 2-3-4,  3 at digit 5  Motor 3- Left shoulder abd , elbow flexion and ext 4- at L HF, KE 3- ADF     Assessment & Plan:   Left spastic hemi due to CVA will adjust Botox dosing as below due to worsening finger spasticity also has some element of spasticity   Pecs 75U Biceps 50 Brachiorad 50 FCR 50 FCU 50 FDP 50 FDS 50 PT 25

## 2021-07-23 ENCOUNTER — Encounter: Payer: Self-pay | Admitting: Podiatry

## 2021-07-23 ENCOUNTER — Ambulatory Visit: Payer: Medicaid Other | Admitting: Podiatry

## 2021-07-23 ENCOUNTER — Other Ambulatory Visit: Payer: Self-pay

## 2021-07-23 DIAGNOSIS — M79675 Pain in left toe(s): Secondary | ICD-10-CM

## 2021-07-23 DIAGNOSIS — M79674 Pain in right toe(s): Secondary | ICD-10-CM | POA: Diagnosis not present

## 2021-07-23 DIAGNOSIS — B351 Tinea unguium: Secondary | ICD-10-CM

## 2021-07-24 ENCOUNTER — Encounter: Payer: Self-pay | Admitting: Physical Medicine & Rehabilitation

## 2021-07-24 ENCOUNTER — Encounter: Payer: Medicaid Other | Attending: Physical Medicine & Rehabilitation | Admitting: Physical Medicine & Rehabilitation

## 2021-07-24 VITALS — BP 137/89 | HR 96 | Temp 98.0°F | Ht 65.5 in | Wt 226.6 lb

## 2021-07-24 DIAGNOSIS — G8110 Spastic hemiplegia affecting unspecified side: Secondary | ICD-10-CM | POA: Diagnosis present

## 2021-07-24 NOTE — Patient Instructions (Signed)

## 2021-07-24 NOTE — Progress Notes (Signed)
Botox Injection for spasticity using needle EMG guidance  Dilution: 50 Units/ml Indication: Severe spasticity which interferes with ADL,mobility and/or  hygiene and is unresponsive to medication management and other conservative care Informed consent was obtained after describing risks and benefits of the procedure with the patient. This includes bleeding, bruising, infection, excessive weakness, or medication side effects. A REMS form is on file and signed. Needle: 25g 2" needle electrode Number of units per muscle Total of 400U  Pecs 75U Biceps 50 Brachiorad 50 FCR 50 FCU 50 FDP 50 FDS 50 PT 25 All injections were done after obtaining appropriate EMG activity and after negative drawback for blood. The patient tolerated the procedure well. Post procedure instructions were given. A followup appointment was made.

## 2021-07-28 NOTE — Progress Notes (Signed)
Subjective:  Patient ID: [name], male    DOB: Mar 09, 1961,  MRN: [id number]  [name] presents to clinic today for painful elongated mycotic toenails 1-5 bilaterally which are tender when wearing enclosed shoe gear. Pain is relieved with periodic professional debridement.  New problem(s): None.   PCP is [name], MD , and last visit was October, 2022.  No Known Allergies  Review of Systems: Negative except as noted in the HPI. Objective:   Constitutional ABRHAM MASLOWSKI is a pleasant 61 y.o. African American male, obese in NAD. AAO x 3.   Vascular Capillary refill time to digits immediate b/l. Palpable pedal pulses b/l LE. Pedal hair sparse. No pain with calf compression b/l. Trace edema noted BLE. No cyanosis or clubbing noted b/l LE.  Neurologic Normal speech. Oriented to person, place, and time. Protective sensation intact 5/5 intact bilaterally with 10g monofilament b/l.  Dermatologic Pedal integument with normal turgor, texture and tone BLE. No open wounds b/l LE. No interdigital macerations noted b/l LE. Toenails 3-5 bilaterally elongated, discolored, dystrophic, thickened, and crumbly with subungual debris and tenderness to dorsal palpation. Anonychia noted bilateral great toes and bilateral 2nd toes. Nailbed(s) epithelialized.  Porokeratotic lesion(s) R hallux, submet head 5 right foot, and sub 5th met base b/l lower extremities. No erythema, no edema, no drainage, no fluctuance.  Orthopedic: Muscle strength 5/5 to all lower extremity muscle groups bilaterally. Pes planus deformity noted bilateral LE. Utilizes cane for ambulation assistance.   Radiographs: None   Assessment:   1. Pain due to onychomycosis of toenails of both feet    Plan:  Patient was evaluated and treated and all questions answered. Consent given for treatment as described below: -Medicaid ABN signed for this year. Patient refuses services of paring of porokeratoses today. Copy has been  placed in patient chart. -Mycotic toenails 3-5 bilaterally were debrided in length and girth with sterile nail nippers and dremel without iatrogenic bleeding. -Patient/POA to call should there be question/concern in the interim.  Return in about 3 months (around 10/21/2021).  Marzetta Board, DPM

## 2021-08-29 ENCOUNTER — Ambulatory Visit: Payer: Medicaid Other | Admitting: Podiatry

## 2021-09-04 ENCOUNTER — Encounter: Payer: Self-pay | Admitting: Physical Medicine & Rehabilitation

## 2021-09-04 ENCOUNTER — Other Ambulatory Visit: Payer: Self-pay

## 2021-09-04 ENCOUNTER — Encounter: Payer: Medicaid Other | Attending: Physical Medicine & Rehabilitation | Admitting: Physical Medicine & Rehabilitation

## 2021-09-04 VITALS — BP 124/87 | HR 101 | Ht 65.5 in | Wt 221.0 lb

## 2021-09-04 DIAGNOSIS — I69354 Hemiplegia and hemiparesis following cerebral infarction affecting left non-dominant side: Secondary | ICD-10-CM | POA: Diagnosis not present

## 2021-09-04 DIAGNOSIS — G8110 Spastic hemiplegia affecting unspecified side: Secondary | ICD-10-CM | POA: Diagnosis not present

## 2021-09-04 NOTE — Progress Notes (Signed)
? ?Subjective:  ? ? Patient ID: [name], male    DOB: 1960/08/30, 61 y.o.   MRN: [id number] ? ?HPI ? ?61 year old male returns for spasticity management follow-up.  He states that the botulinum toxin injection was helpful in reducing the tone in his elbow shoulder and wrist.  He still does have some finger tightness.  He is not wearing his brace for his hand he does not really feel like it is fitting that well it is several years old. ?Spastic LEFT hemiparesis R BG ICH 03/20/2005 ?07/24/21 Botox ?Total of 400U  ?Pecs 75U ?Biceps 50 ?Brachiorad 50 ?FCR 50 ?FCU 50 ?FDP 50 ?FDS 50 ?PT 25 ?Pain Inventory ?Average Pain 5 ?Pain Right Now 0 ?My pain is intermittent, sharp, and aching ? ?In the last 24 hours, has pain interfered with the following? ?General activity 0 ?Relation with others 0 ?Enjoyment of life 0 ?What TIME of day is your pain at its worst? morning  ?Sleep (in general) Fair ? ?Pain is worse with:  nothing makes it worse ?Pain improves with: medication ?Relief from Meds: 4 ? ?Family History  ?Problem Relation Age of Onset  ? Brain cancer Maternal Grandfather   ? ?Social History  ? ?Socioeconomic History  ? Marital status: Single  ?  Spouse name: Not on file  ? Number of children: 0  ? Years of education: Not on file  ? Highest education level: Not on file  ?Occupational History  ?  Comment: disability  ?Tobacco Use  ? Smoking status: Every Day  ?  Packs/day: 0.50  ?  Years: 15.00  ?  Pack years: 7.50  ?  Types: Cigarettes  ? Smokeless tobacco: Never  ?Vaping Use  ? Vaping Use: Never used  ?Substance and Sexual Activity  ? Alcohol use: No  ? Drug use: No  ? Sexual activity: Not on file  ?Other Topics Concern  ? Not on file  ?Social History Narrative  ? Not on file  ? ?Social Determinants of Health  ? ?Financial Resource Strain: Not on file  ?Food Insecurity: Not on file  ?Transportation Needs: Not on file  ?Physical Activity: Not on file  ?Stress: Not on file  ?Social Connections: Not on file  ? ?Past  Surgical History:  ?Procedure Laterality Date  ? COLONOSCOPY WITH PROPOFOL N/A 09/27/2015  ? Procedure: COLONOSCOPY WITH PROPOFOL;  Surgeon: [name], MD;  [facility];  Service: Endoscopy;  Laterality: N/A;  ? CYSTOSCOPY N/A 10/05/2012  ? Procedure: CYSTOSCOPY, retrograde and right stent placement;  Surgeon: [name], MD;  [facility];  Service: Urology;  Laterality: N/A;  ? CYSTOSCOPY WITH RETROGRADE PYELOGRAM, URETEROSCOPY AND STENT PLACEMENT Right 04/13/2014  ? Procedure: CYSTOSCOPY WITH RETROGRADE PYELOGRAM, URETEROSCOPY AND STENT PLACEMENT, LASER ABLATION OF SCAR TISSUE;  Surgeon: [name], MD;  [facility];  Service: Urology;  Laterality: Right;  ? EYE SURGERY Left age 2  ? HOLMIUM LASER APPLICATION Right 99/83/3825  ? Procedure: HOLMIUM LASER APPLICATION;  Surgeon: [name], MD;  [facility];  Service: Urology;  Laterality: Right;  ? NEPHROLITHOTOMY Right 10/05/2012  ? Procedure: NEPHROLITHOTOMY PERCUTANEOUS;  Surgeon: [name], MD;  [facility];  Service: Urology;  Laterality: Right;  WITH CYSTO AND SURGEON ACCESS ?  ? NEPHROLITHOTOMY Right 10/07/2012  ? Procedure: NEPHROLITHOTOMY PERCUTANEOUS SECOND LOOK. Stent exchange,Ureteroscopy ;  Surgeon: [name], MD;  [facility];  Service: Urology;  Laterality: Right;  ? RADIOACTIVE SEED IMPLANT N/A 11/12/2018  ? Procedure: RADIOACTIVE  SEED IMPLANT/BRACHYTHERAPY IMPLANT;  Surgeon: [name], MD;  [facility];  Service: Urology;  Laterality: N/A;  90 MINS  ? SPACE OAR INSTILLATION N/A 11/12/2018  ? Procedure: SPACE OAR INSTILLATION;  Surgeon: [name], MD;  [facility];  Service: Urology;  Laterality: N/A;  ? TOENAIL EXCISION    ? ?Past Surgical History:  ?Procedure Laterality Date  ? COLONOSCOPY WITH PROPOFOL N/A 09/27/2015  ? Procedure: COLONOSCOPY WITH PROPOFOL;  Surgeon: [name], MD;  [facility];  Service: Endoscopy;  Laterality: N/A;  ? CYSTOSCOPY N/A  10/05/2012  ? Procedure: CYSTOSCOPY, retrograde and right stent placement;  Surgeon: [name], MD;  [facility];  Service: Urology;  Laterality: N/A;  ? CYSTOSCOPY WITH RETROGRADE PYELOGRAM, URETEROSCOPY AND STENT PLACEMENT Right 04/13/2014  ? Procedure: CYSTOSCOPY WITH RETROGRADE PYELOGRAM, URETEROSCOPY AND STENT PLACEMENT, LASER ABLATION OF SCAR TISSUE;  Surgeon: [name], MD;  [facility];  Service: Urology;  Laterality: Right;  ? EYE SURGERY Left age 75  ? HOLMIUM LASER APPLICATION Right 62/37/6283  ? Procedure: HOLMIUM LASER APPLICATION;  Surgeon: [name], MD;  [facility];  Service: Urology;  Laterality: Right;  ? NEPHROLITHOTOMY Right 10/05/2012  ? Procedure: NEPHROLITHOTOMY PERCUTANEOUS;  Surgeon: [name], MD;  [facility];  Service: Urology;  Laterality: Right;  WITH CYSTO AND SURGEON ACCESS ?  ? NEPHROLITHOTOMY Right 10/07/2012  ? Procedure: NEPHROLITHOTOMY PERCUTANEOUS SECOND LOOK. Stent exchange,Ureteroscopy ;  Surgeon: [name], MD;  [facility];  Service: Urology;  Laterality: Right;  ? RADIOACTIVE SEED IMPLANT N/A 11/12/2018  ? Procedure: RADIOACTIVE SEED IMPLANT/BRACHYTHERAPY IMPLANT;  Surgeon: [name], MD;  [facility];  Service: Urology;  Laterality: N/A;  90 MINS  ? SPACE OAR INSTILLATION N/A 11/12/2018  ? Procedure: SPACE OAR INSTILLATION;  Surgeon: [name], MD;  [facility];  Service: Urology;  Laterality: N/A;  ? TOENAIL EXCISION    ? ?Past Medical History:  ?Diagnosis Date  ? Arthritis   ? 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 blind left eye  ? COPD (chronic obstructive pulmonary disease) ([place])   ? GERD (gastroesophageal reflux disease)   ? 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 History of gout   ? History of kidney stones   ? Hyperlipidemia   ? Hypertension   ? Prostate cancer [facility]) urologist-- dr t.  Tresa Moore;  oncologist-  dr manning  ? dx 07-20-2017  ? Renal calculi   ? Stroke ([place]) sept 26,  20006  ?  hx mini strokes in past, 1 large stroke  ? Weakness of left side of body 03/2005  ? cva residual  ? Weakness of one side of body   ? L sided weakness after stroke  ? ?BP 124/87   Pulse (!) 101   Ht 5' 5.5" (1.664 m)   Wt 221 lb (100.2 kg)   SpO2 93%   BMI 36.22 kg/m?  ? ?Opioid Risk Score:   ?Fall Risk Score:  `1 ? ?Depression screen PHQ 2/9 ? ?Depression screen [facility] 2/9 09/04/2021 07/24/2021 05/31/2021 01/23/2021  ?Decreased Interest 0 0 0 0  ?Down, Depressed, Hopeless 0 0 0 0  ?PHQ - 2 Score 0 0 0 0  ?  ? ?Review of Systems  ?Constitutional: Negative.   ?HENT: Negative.    ?Eyes: Negative.   ?Respiratory: Negative.    ?Cardiovascular: Negative.   ?  Gastrointestinal: Negative.   ?Endocrine: Negative.   ?Genitourinary: Negative.   ?Musculoskeletal:  Positive for gait problem.  ?Skin:  Positive for rash.  ?Allergic/Immunologic: Negative.   ?Neurological:  Positive for weakness.  ?Hematological: Negative.   ?Psychiatric/Behavioral: Negative.    ? ?   ?Objective:  ? Physical Exam ?Vitals and nursing note reviewed.  ?Constitutional:   ?   Appearance: He is obese.  ?HENT:  ?   Head: Normocephalic and atraumatic.  ?Eyes:  ?   Comments: Blind in left eye lens opacified  ?Skin: ?   General: Skin is dry.  ?Neurological:  ?   Mental Status: He is alert and oriented to person, place, and time.  ?   Comments: Motor strength is 0/5 in left upper extremity ?Tone MAS 1 at the left shoulder 1 at the elbow flexors 2 at the wrist flexors 3 at the finger flexors  ?Psychiatric:     ?   Mood and Affect: Mood normal.     ?   Behavior: Behavior normal.  ?Patient with dry skin somewhat more so on the left arm than right arm ? ? ? ? ?   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  Is worse in the finger flexors.  He needs to be wearing brace.  He had an OT fabricated brace from several years back.  He  states it was not comfortable.  We will get a padded brace from [facility]. ?Repeat injection in 6 weeks same muscles as well as dosing ? ?

## 2021-10-16 ENCOUNTER — Encounter: Payer: Medicaid Other | Attending: Physical Medicine & Rehabilitation | Admitting: Physical Medicine & Rehabilitation

## 2021-10-16 ENCOUNTER — Encounter: Payer: Self-pay | Admitting: Physical Medicine & Rehabilitation

## 2021-10-16 VITALS — BP 143/79 | HR 98 | Ht 65.5 in | Wt 223.0 lb

## 2021-10-16 DIAGNOSIS — G8110 Spastic hemiplegia affecting unspecified side: Secondary | ICD-10-CM | POA: Diagnosis not present

## 2021-10-16 NOTE — Progress Notes (Signed)
Botox Injection for spasticity using needle EMG guidance ? ?Dilution: 50 Units/ml ?Indication: Severe spasticity which interferes with ADL,mobility and/or  hygiene and is unresponsive to medication management and other conservative care ?Informed consent was obtained after describing risks and benefits of the procedure with the patient. This includes bleeding, bruising, infection, excessive weakness, or medication side effects. A REMS form is on file and signed. ?Needle: 25g 2" needle electrode ?Number of units per muscle ?Total of 400U  ?Pecs 75U ?Biceps 100 medial (short head) ? ?FCR 50 ?FCU 50 ?FDP 50 ?FDS 50 ?PT 25 ?All injections were done after obtaining appropriate EMG activity and after negative drawback for blood. The patient tolerated the procedure well. Post procedure instructions were given. A followup appointment was made.  ? ? ?

## 2021-10-23 ENCOUNTER — Ambulatory Visit: Payer: Medicaid Other | Admitting: Podiatry

## 2021-10-23 ENCOUNTER — Encounter: Payer: Self-pay | Admitting: Podiatry

## 2021-10-23 DIAGNOSIS — M79675 Pain in left toe(s): Secondary | ICD-10-CM

## 2021-10-23 DIAGNOSIS — M79671 Pain in right foot: Secondary | ICD-10-CM

## 2021-10-23 DIAGNOSIS — M79672 Pain in left foot: Secondary | ICD-10-CM | POA: Diagnosis not present

## 2021-10-23 DIAGNOSIS — B351 Tinea unguium: Secondary | ICD-10-CM

## 2021-10-23 DIAGNOSIS — M79674 Pain in right toe(s): Secondary | ICD-10-CM

## 2021-10-23 DIAGNOSIS — Q828 Other specified congenital malformations of skin: Secondary | ICD-10-CM | POA: Diagnosis not present

## 2021-10-23 NOTE — Progress Notes (Signed)
?  Subjective:  ?Patient ID: [name], male    DOB: 11-22-1960,  MRN: [id number] ? ?[name] presents to clinic today for painful porokeratotic lesion(s) right foot, callus right foot and painful mycotic toenails that limit ambulation. Painful toenails interfere with ambulation. Aggravating factors include wearing enclosed shoe gear. Pain is relieved with periodic professional debridement. Painful porokeratotic lesions are aggravated when weightbearing with and without shoegear. Pain is relieved with periodic professional debridement. ? ?Patient is prediabetic. ? ?New problem(s): None.  ? ?PCP is [name], MD , and last visit was October, 2022. ? ?No Known Allergies ? ?Review of Systems: Negative except as noted in the HPI. ? ?Objective: No changes noted in today's physical examination. ? ?Constitutional [name] is a pleasant 61 y.o. African American male, obese in NAD. AAO x 3.   ?Vascular Capillary refill time to digits immediate b/l. Palpable pedal pulses b/l LE. Pedal hair sparse. No pain with calf compression b/l. Trace edema noted BLE. No cyanosis or clubbing noted b/l LE.  ?Neurologic Normal speech. Oriented to person, place, and time. Protective sensation intact 5/5 intact bilaterally with 10g monofilament b/l.  ?Dermatologic Pedal integument with normal turgor, texture and tone BLE. No open wounds b/l LE. No interdigital macerations noted b/l LE. Toenails 3-5 bilaterally elongated, discolored, dystrophic, thickened, and crumbly with subungual debris and tenderness to dorsal palpation. Anonychia noted bilateral great toes and bilateral 2nd toes. Nailbed(s) epithelialized.  Porokeratotic lesion(s) R hallux, submet head 5 b/l and sub 5th met base b/l lower extremities. No erythema, no edema, no drainage, no fluctuance.  ?Orthopedic: Muscle strength 5/5 to all lower extremity muscle groups bilaterally. Pes planus deformity noted bilateral LE. Utilizes cane for ambulation assistance.   ? ?Radiographs: None ? ?Assessment/Plan: ?1. Pain due to onychomycosis of toenails of both feet   ?2. Porokeratosis   ?3. Pain in both feet   ?-Patient was evaluated and treated. All patient's and/or POA's questions/concerns answered on today's visit. ?-Medicaid ABN signed for this year. Patient consents for services of paring of calluses today. Copy has been placed in patient chart. ?-Toenails 3-5 b/l were debrided in length and girth with sterile nail nippers and dremel without iatrogenic bleeding.  ?-Painful porokeratotic lesion(s) R hallux, submet head 5 b/l, and sub 5th met base right foot pared and enucleated with sterile scalpel blade and sterile currette. Light bleeding sub 5th met base lesion addressed with Lumicain Hemostatic Solution. TAO and light dressing applied. He may remove on tomorrow. Apply Neosporin Cream once daily for 3 days. Call if he has any problems. Total number of lesions debrided=4. Patient noted relief of symptoms after debridement on today's visit. ?-Recommended Skechers Loafers with stretchable uppers and memory foam insoles which may be purchased at [employer]. ?-Patient/POA to call should there be question/concern in the interim.  ? ?Return in about 9 weeks (around 12/25/2021). ? ?Marzetta Board, DPM  ?

## 2021-10-23 NOTE — Patient Instructions (Addendum)
Recommend Skechers Loafers with stretchable uppers and memory foam insoles. They can be purchased at [employer] on [address] in [place]. ? ?[facility]  ?[place] ?[place], [place] [address] ? ?Phone: [phone] ?

## 2021-11-27 ENCOUNTER — Encounter: Payer: Medicaid Other | Attending: Physical Medicine & Rehabilitation | Admitting: Physical Medicine & Rehabilitation

## 2021-11-27 ENCOUNTER — Encounter: Payer: Self-pay | Admitting: Physical Medicine & Rehabilitation

## 2021-11-27 VITALS — BP 137/80 | HR 91 | Ht 65.5 in | Wt 223.2 lb

## 2021-11-27 DIAGNOSIS — G8110 Spastic hemiplegia affecting unspecified side: Secondary | ICD-10-CM | POA: Insufficient documentation

## 2021-11-27 NOTE — Progress Notes (Signed)
Subjective:    Patient ID: [name], male    DOB: May 30, 1961, 61 y.o.   MRN: [id number]  HPI 61 year old male status post right basal ganglia hemorrhage in 2006 who has chronic left spastic hemiplegia.  He was originally referred to this office by occupational therapy in 2020.  At that time he was receiving therapy. He has been receiving Botox injections roughly every 3 months.  He has had good relief of his spasticity however his left hand PIPs appear to have contracture in digits 1 through 4.  He does not have any history of nerve injury in the past and states that this problem started since his stroke.  Total of 400U  Pecs 75U Biceps 100 medial (short head)   FCR 50 FCU 50 FDP 50 FDS 50 PT 25 Pain Inventory Average Pain 0 Pain Right Now 0 My pain is  no pain  In the last 24 hours, has pain interfered with the following? General activity 0 Relation with others 0 Enjoyment of life 0 What TIME of day is your pain at its worst? No pain Sleep (in general) NA  Pain is worse with:  no pain Pain improves with:  no pain Relief from Meds:  no pain  Family History  Problem Relation Age of Onset   Brain cancer Maternal Grandfather    Social History   Socioeconomic History   Marital status: Single    Spouse name: Not on file   Number of children: 0   Years of education: Not on file   Highest education level: Not on file  Occupational History    Comment: disability  Tobacco Use   Smoking status: Every Day    Packs/day: 0.50    Years: 15.00    Pack years: 7.50    Types: Cigarettes   Smokeless tobacco: Never  Vaping Use   Vaping Use: Never used  Substance and Sexual Activity   Alcohol use: No   Drug use: No   Sexual activity: Not on file  Other Topics Concern   Not on file  Social History Narrative   Not on file   Social Determinants of Health   Financial Resource Strain: Not on file  Food Insecurity: Not on file  Transportation Needs: Not on file   Physical Activity: Not on file  Stress: Not on file  Social Connections: Not on file   Past Surgical History:  Procedure Laterality Date   COLONOSCOPY WITH PROPOFOL N/A 09/27/2015   Procedure: COLONOSCOPY WITH PROPOFOL;  Surgeon: [name], MD;  [facility];  Service: Endoscopy;  Laterality: N/A;   CYSTOSCOPY N/A 10/05/2012   Procedure: CYSTOSCOPY, retrograde and right stent placement;  Surgeon: [name], MD;  [facility];  Service: Urology;  Laterality: N/A;   CYSTOSCOPY WITH RETROGRADE PYELOGRAM, URETEROSCOPY AND STENT PLACEMENT Right 04/13/2014   Procedure: CYSTOSCOPY WITH RETROGRADE PYELOGRAM, URETEROSCOPY AND STENT PLACEMENT, LASER ABLATION OF SCAR TISSUE;  Surgeon: [name], MD;  [facility];  Service: Urology;  Laterality: Right;   EYE SURGERY Left age 2   HOLMIUM LASER APPLICATION Right 23/55/7322   Procedure: HOLMIUM LASER APPLICATION;  Surgeon: [name], MD;  [facility];  Service: Urology;  Laterality: Right;   NEPHROLITHOTOMY Right 10/05/2012   Procedure: NEPHROLITHOTOMY PERCUTANEOUS;  Surgeon: [name], MD;  [facility];  Service: Urology;  Laterality: Right;  WITH CYSTO AND SURGEON ACCESS    NEPHROLITHOTOMY Right 10/07/2012   Procedure: NEPHROLITHOTOMY PERCUTANEOUS SECOND LOOK. Stent exchange,Ureteroscopy ;  Surgeon: [name], MD;  [facility];  Service: Urology;  Laterality: Right;   RADIOACTIVE SEED IMPLANT N/A 11/12/2018   Procedure: RADIOACTIVE SEED IMPLANT/BRACHYTHERAPY IMPLANT;  Surgeon: [name], MD;  [facility];  Service: Urology;  Laterality: N/A;  [place] N/A 11/12/2018   Procedure: SPACE OAR INSTILLATION;  Surgeon: [name], MD;  [facility];  Service: Urology;  Laterality: N/A;   TOENAIL EXCISION     Past Surgical History:  Procedure Laterality Date   COLONOSCOPY WITH PROPOFOL N/A 09/27/2015   Procedure: COLONOSCOPY WITH PROPOFOL;  Surgeon: [name], MD;  [facility];  Service: Endoscopy;  Laterality: N/A;   CYSTOSCOPY N/A 10/05/2012   Procedure: CYSTOSCOPY, retrograde and right stent placement;  Surgeon: [name], MD;  [facility];  Service: Urology;  Laterality: N/A;   CYSTOSCOPY WITH RETROGRADE PYELOGRAM, URETEROSCOPY AND STENT PLACEMENT Right 04/13/2014   Procedure: CYSTOSCOPY WITH RETROGRADE PYELOGRAM, URETEROSCOPY AND STENT PLACEMENT, LASER ABLATION OF SCAR TISSUE;  Surgeon: [name], MD;  [facility];  Service: Urology;  Laterality: Right;   EYE SURGERY Left age 71   HOLMIUM LASER APPLICATION Right 20/35/5974   Procedure: HOLMIUM LASER APPLICATION;  Surgeon: [name], MD;  [facility];  Service: Urology;  Laterality: Right;   NEPHROLITHOTOMY Right 10/05/2012   Procedure: NEPHROLITHOTOMY PERCUTANEOUS;  Surgeon: [name], MD;  [facility];  Service: Urology;  Laterality: Right;  WITH CYSTO AND SURGEON ACCESS    NEPHROLITHOTOMY Right 10/07/2012   Procedure: NEPHROLITHOTOMY PERCUTANEOUS SECOND LOOK. 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   History of gout    History of  kidney stones    Hyperlipidemia    Hypertension    Prostate cancer [facility]) urologist-- dr [name];  oncologist-  dr [name]   dx 07-20-2017   Renal calculi    Stroke ([place]) sept 26,  20006    hx mini strokes in past, 1 large stroke   Weakness of left side of body 03/2005   cva residual   Weakness of one side of body    L sided weakness after stroke   BP 137/80   Pulse 91   Ht 5' 5.5" (1.664 m)   Wt 223 lb 3.2 oz (101.2 kg)   SpO2 93%   BMI 36.58 kg/m   Opioid Risk Score:   Fall Risk Score:  `1  Depression screen [facility] 2/9     11/27/2021   12:02 PM 10/16/2021   11:25 AM 09/04/2021   10:44 AM 07/24/2021   10:47 AM 05/31/2021    9:45 AM 01/23/2021   10:21 AM  Depression screen PHQ 2/9  Decreased Interest 0 0 0 0 0 0  Down, Depressed, Hopeless 0 0 0 0 0 0  PHQ - 2 Score 0 0 0 0 0 0      Review of Systems  Constitutional: Negative.   HENT: Negative.    Eyes: Negative.   Respiratory: Negative.    Gastrointestinal: Negative.   Endocrine: Negative.   Genitourinary: Negative.   Musculoskeletal:  Positive for gait problem.  Skin: Negative.   Allergic/Immunologic: Negative.   Hematological: Negative.   Psychiatric/Behavioral: Negative.    All other systems reviewed and are negative.     Objective:   Physical Exam Vitals and nursing note reviewed.  Constitutional:      Appearance: He is obese.  HENT:     Head: Normocephalic and atraumatic.  Eyes:     Extraocular Movements: Extraocular movements intact.     Conjunctiva/sclera: Conjunctivae normal.     Pupils: Pupils are equal, round, and reactive to light.  Musculoskeletal:     Comments: Flexion contracture at the left fifth DIP and PIP as well as left second third and fourth PIP  Neurological:     General: No focal deficit present.     Mental Status: He is alert and oriented to person, place, and time.     Comments: Speech without dysarthria or aphasia Left upper extremity 3 - at the deltoid and bicep and tricep 0  at the finger extensors to minus at the wrist flexors. Intrinsic minus deformity left hand.  Psychiatric:        Mood and Affect: Mood normal.        Behavior: Behavior normal.   Reduced range of motion left shoulder with external rotation as well as abduction. Tone MAS 2 in the elbow flexors MAS 1 at the thumb flexor MAS 0 at the wrist flexor difficult to assess finger flexor tone due to contracture.      Assessment & Plan:   Left spastic hemiplegia, due to CVA- needs OT for ROM  Repeat botulinum toxin in 6wks, reduced finger flexor dose and increase dose at biceps and pectoralis.  This is due to finger flexor spasticity that limits the effectiveness of Botox.  May consider even going down to 0 at the finger flexors depending on effect. Botox 400U  Pecs 100U Biceps 125 medial (short head)   FCR 50 FCU 50 FDP 25 FDS 25 PT 25

## 2021-11-29 ENCOUNTER — Ambulatory Visit: Payer: Medicaid Other | Attending: Physical Medicine & Rehabilitation | Admitting: Occupational Therapy

## 2021-11-29 ENCOUNTER — Encounter: Payer: Self-pay | Admitting: Occupational Therapy

## 2021-11-29 DIAGNOSIS — I69354 Hemiplegia and hemiparesis following cerebral infarction affecting left non-dominant side: Secondary | ICD-10-CM | POA: Diagnosis present

## 2021-11-29 DIAGNOSIS — R29898 Other symptoms and signs involving the musculoskeletal system: Secondary | ICD-10-CM

## 2021-11-29 DIAGNOSIS — R2689 Other abnormalities of gait and mobility: Secondary | ICD-10-CM | POA: Diagnosis present

## 2021-11-29 DIAGNOSIS — M25642 Stiffness of left hand, not elsewhere classified: Secondary | ICD-10-CM

## 2021-11-29 DIAGNOSIS — M6281 Muscle weakness (generalized): Secondary | ICD-10-CM | POA: Diagnosis present

## 2021-11-29 NOTE — Therapy (Unsigned)
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    blind left eye   COPD (chronic obstructive pulmonary disease) ([place])    GERD (gastroesophageal reflux disease)    History of CVA 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   History of gout    History of kidney stones    Hyperlipidemia    Hypertension    Prostate cancer [facility]) urologist-- dr [name];  oncologist-  dr [name]   dx 07-20-2017   Renal calculi    Stroke ([place]) sept 26,  20006    hx mini strokes in past, 1 large stroke   Weakness of left side of body 03/2005   cva residual   Weakness of one side of body    L sided weakness after stroke   Past Surgical History:  Procedure Laterality Date   COLONOSCOPY WITH PROPOFOL N/A 09/27/2015   Procedure: COLONOSCOPY WITH PROPOFOL;  Surgeon: [name], MD;  [facility];  Service: Endoscopy;  Laterality: N/A;   CYSTOSCOPY N/A 10/05/2012   Procedure: CYSTOSCOPY, retrograde and right stent placement;  Surgeon: [name], MD;  [facility];  Service: Urology;  Laterality: N/A;   CYSTOSCOPY WITH RETROGRADE PYELOGRAM, URETEROSCOPY AND STENT PLACEMENT Right 04/13/2014   Procedure: CYSTOSCOPY WITH RETROGRADE PYELOGRAM, URETEROSCOPY AND STENT PLACEMENT, LASER ABLATION OF SCAR TISSUE;  Surgeon: [name], MD;  [facility];  Service: Urology;  Laterality: Right;   EYE  SURGERY Left age 44   HOLMIUM LASER APPLICATION Right 77/82/4235   Procedure: HOLMIUM LASER APPLICATION;  Surgeon: [name], MD;  [facility];  Service: Urology;  Laterality: Right;   NEPHROLITHOTOMY Right 10/05/2012   Procedure: NEPHROLITHOTOMY PERCUTANEOUS;  Surgeon: [name], MD;  [facility];  Service: Urology;  Laterality: Right;  WITH CYSTO AND SURGEON ACCESS    NEPHROLITHOTOMY Right 10/07/2012   Procedure: NEPHROLITHOTOMY PERCUTANEOUS SECOND LOOK. Stent exchange,Ureteroscopy ;  Surgeon: [name], MD;  [facility];  Service: Urology;  Laterality: Right;   RADIOACTIVE SEED IMPLANT N/A 11/12/2018   Procedure: RADIOACTIVE SEED IMPLANT/BRACHYTHERAPY IMPLANT;  Surgeon: [name], MD;  [facility];  Service: Urology;  Laterality: N/A;  [place] N/A 11/12/2018   Procedure: SPACE OAR INSTILLATION;  Surgeon: [name], MD;  [facility];  Service: Urology;  Laterality: N/A;   TOENAIL EXCISION     Patient Active Problem List   Diagnosis Date Noted   Spastic hemiplegia ([name]) 10/16/2021   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   Spastic hemiparesis of left nondominant side due to acute cerebral infarction ([name]) 04/03/2021   Malignant neoplasm of prostate ([name]) 08/19/2018    ONSET DATE: 11/27/2021 (date of OT order)  REFERRING DIAG: G81.10 (ICD-10-CM) - Spastic hemiplegia, unspecified etiology, unspecified laterality ([place])  THERAPY DIAG:  Hemiplegia and hemiparesis following cerebral infarction affecting left non-dominant side (HCC)  Muscle weakness (generalized)  Stiffness of left hand, not elsewhere classified  Other symptoms and signs involving the musculoskeletal system  Other abnormalities of gait and mobility  Rationale for Evaluation and Treatment Habilitation  SUBJECTIVE:   SUBJECTIVE STATEMENT: Pt arrives for OP OT evaluation w/ primary concern of decreased functional use of his LUE. States he had a CVA in 2006 and  has been receiving botox injections for the past couple years. Also reports he received OT/PT in 2020 and was administered an HEP, but has not been completing those exercises consistently. Feels like his L arm and hand have notably stiffened up recently. Pt accompanied by: self  PERTINENT HISTORY: Chronic L spastic hemi s/p R basal ganglia hemorrhage in 2006. Additional PMH: HTN, HLD, blindness in L eye w/ low vision in R eye, COPD h/o prostate cancer, OA and gout   PRECAUTIONS: Fall  WEIGHT BEARING RESTRICTIONS No  PAIN:  Are you having pain? No  FALLS: Has patient fallen in last 6 months? No  LIVING ENVIRONMENT: Lives with: lives alone; has a dog Lives in: House/apartment Stairs: No Has following equipment at home: Single point cane, Wheelchair (power), and Ramped entry  PLOF: Independent with community mobility with device and Mod I w/ BADLs  PATIENT GOALS: "Raise up my (L) arm;" "try to get it to help me hold something"  OBJECTIVE:   HAND DOMINANCE: Right  ADLs: Overall ADLs: Mod I; lives alone Transfers/ambulation related to ADLs: Requires AD for in-home mobility Eating: Needs assist cutting food Grooming: Mod I UB Dressing: Occasional difficulty w/ clothing manipulatives LB Dressing: Wears elastic pants mostly Toileting: Mod I Bathing: Mod I Equipment: Shower seat with back, Grab bars, and elevated toilet seat  IADLs: Shopping: Uses an Sales promotion account executive Light housekeeping: Aide who comes in 7x/week Meal Prep: Mod I w/ simple snack and meal prep Community mobility: Relies on others Medication management: Independent  MOBILITY STATUS: Ambulated in/out of session w/ single-point cane   UPPER EXTREMITY ROM     Active ROM Left eval - 6/1  Shoulder flexion 29  Shoulder abduction 21  Shoulder adduction 0  Shoulder extension 28  Shoulder internal rotation   Shoulder external rotation   Elbow flexion 95  Elbow extension 45  Wrist flexion 0  Wrist  extension 26  Wrist ulnar deviation   Wrist radial deviation   Wrist pronation   Wrist supination   (Blank rows = not tested)   Passive ROM Left eval  Shoulder flexion 32  Shoulder abduction   Shoulder adduction   Shoulder extension   Shoulder internal rotation   Shoulder external rotation   Elbow flexion 135  Elbow extension 16  Wrist flexion 58  Wrist extension 53  Wrist ulnar deviation   Wrist radial deviation   Wrist pronation   Wrist supination   (Blank rows = not tested)  HAND FUNCTION: Unable to extend hand adequately for gross grasp of L hand  COORDINATION: LUE: Impaired  SENSATION: Reports decreased sensation of LUE; not tested during evaluation  MUSCLE TONE: LUE: Moderate and Hypertonic  COGNITION: Overall cognitive status: Within functional limits for tasks assessed  VISION: Subjective report: Able to compensate prn Visual history:  Blindness in L eye   OBSERVATIONS: Significant stiffness/decreased PROM of L hand PIP/DIPJs and composite wrist and hand ext   TODAY'S TREATMENT:  N/A   PATIENT EDUCATION: Education details: Education provided on role and purpose of OT,  as well as potential interventions and goals for therapy based on initial evaluation findings. Continued condition-specific education, answering pt questions prn. Person educated: Patient Education method: Explanation Education comprehension: verbalized understanding   HOME EXERCISE PROGRAM: To be administered   GOALS: Goals reviewed with patient? Yes  SHORT TERM GOALS: Target date: 12/29/21  STG  Status:  1 Pt will demonstrate independence w/ HEP designed for self-stretching and ROM of LUE Baseline: Not currently completing HEP INITIAL     LONG TERM GOALS: Target date: 01/28/22  LTG  Status:  1 Pt will improve L shoulder flexion to at least 70 degrees of AROM to improve functional use of LUE during BADLs Baseline: 29* AROM (32* PROM) INITIAL  2 Pt will demonstrate use  of LUE as gross assist during simple dressing, cleaning, or meal prep task Baseline: decreased functional use of LUE INITIAL  3 Pt will be independent w/ wear and care of orthosis or other positional considerations for management of L hand spasticity Baseline: has not worn resting hand splint in years INITIAL     ASSESSMENT:  CLINICAL IMPRESSION: Pt is a 61 y/o male who presents to OP OT due to chronic L-sided spastic hemiparesis s/p R CVA in 2006. PMH includes HTN, HLD, blindness in L eye w/ low vision in R eye, COPD h/o prostate cancer, OA and gout. Pt currently lives alone in a single-level home and has good access to appropriate DME and AE. Pt also has been receiving regular botox to LUE since 2020 w/ his next injection scheduled on 8/1. Pt will benefit from skilled occupational therapy services to ROM, [place], coordination, NMR, introduction of compensatory strategies/AE prn, and implementation of an HEP to improve participation and functional use of LUE during all ADLs.  PERFORMANCE DEFICITS in functional skills including ADLs, IADLs, coordination, dexterity, proprioception, sensation, tone, ROM, strength, muscle spasms, FMC, GMC, mobility, balance, and UE functional use.  IMPAIRMENTS are limiting patient from ADLs and IADLs.   COMORBIDITIES has co-morbidities such as blindness in L eye  that affects occupational performance. Patient will benefit from skilled OT to address above impairments and improve overall function.  MODIFICATION OR ASSISTANCE TO COMPLETE EVALUATION: Min-Moderate modification of tasks or assist with assess necessary to complete an evaluation.  OT OCCUPATIONAL PROFILE AND HISTORY: Detailed assessment: Review of records and additional review of physical, cognitive, psychosocial history related to current functional performance.  CLINICAL DECISION MAKING: Moderate - several treatment options, min-mod task modification necessary  REHAB POTENTIAL: Fair - late effects of  CVA  EVALUATION COMPLEXITY: Moderate   PLAN: OT FREQUENCY: 1x/week  OT DURATION: 6 weeks  PLANNED INTERVENTIONS: self care/ADL training, therapeutic exercise, therapeutic activity, neuromuscular re-education, manual therapy, passive range of motion, functional mobility training, splinting, electrical stimulation, fluidotherapy, moist heat, cryotherapy, patient/family education, and DME and/or AE instructions  RECOMMENDED OTHER SERVICES: None  CONSULTED AND AGREED WITH PLAN OF CARE: Patient  PLAN FOR NEXT SESSION: Initiate HEP for self-stretching (shoulder flex/ext rot, elbow flex/ext, wrist and hand ext)   Kathrine Cords, MSOT, OTR/L 11/29/2021, 9:08 AM

## 2021-12-03 ENCOUNTER — Ambulatory Visit: Payer: Medicaid Other | Admitting: Occupational Therapy

## 2021-12-03 ENCOUNTER — Encounter: Payer: Self-pay | Admitting: Occupational Therapy

## 2021-12-03 DIAGNOSIS — M6281 Muscle weakness (generalized): Secondary | ICD-10-CM

## 2021-12-03 DIAGNOSIS — M25642 Stiffness of left hand, not elsewhere classified: Secondary | ICD-10-CM

## 2021-12-03 DIAGNOSIS — I69354 Hemiplegia and hemiparesis following cerebral infarction affecting left non-dominant side: Secondary | ICD-10-CM

## 2021-12-03 DIAGNOSIS — R2689 Other abnormalities of gait and mobility: Secondary | ICD-10-CM

## 2021-12-03 DIAGNOSIS — R29898 Other symptoms and signs involving the musculoskeletal system: Secondary | ICD-10-CM

## 2021-12-03 NOTE — Therapy (Signed)
OUTPATIENT OCCUPATIONAL THERAPY TREATMENT NOTE   Patient Name: [name] MRN: [id number] DOB:1961/04/18, 61 y.o., male Today's Date: 12/03/2021  PCP: [name], MD REFERRING PROVIDER: [name], MD  END OF SESSION:   OT End of Session - 12/03/21 0804     Visit Number 2    Number of Visits 7    Date for OT Re-Evaluation 01/28/22    Authorization Type [place] Medicaid    OT Start Time 0800    OT Stop Time 0842    OT Time Calculation (min) 42 min    Activity Tolerance Patient tolerated treatment well    Behavior During Therapy WFL for tasks assessed/performed            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   History of gout    History of kidney stones    Hyperlipidemia    Hypertension    Prostate cancer [facility]) urologist-- dr [name];  oncologist-  dr [name]   dx 07-20-2017   Renal calculi    Stroke ([place]) sept 26,  20006    hx mini strokes in past, 1 large stroke   Weakness of left side of body 03/2005   cva residual   Weakness of one side of body    L sided weakness after stroke   Past Surgical History:  Procedure Laterality Date   COLONOSCOPY WITH PROPOFOL N/A 09/27/2015   Procedure: COLONOSCOPY WITH PROPOFOL;  Surgeon: [name], MD;  [facility];  Service: Endoscopy;  Laterality: N/A;   CYSTOSCOPY N/A 10/05/2012   Procedure: CYSTOSCOPY, retrograde and right stent placement;  Surgeon: [name], MD;  [facility];  Service: Urology;  Laterality: N/A;   CYSTOSCOPY WITH RETROGRADE PYELOGRAM, URETEROSCOPY AND STENT PLACEMENT Right 04/13/2014   Procedure: CYSTOSCOPY WITH RETROGRADE PYELOGRAM, URETEROSCOPY AND STENT  PLACEMENT, LASER ABLATION OF SCAR TISSUE;  Surgeon: [name], MD;  [facility];  Service: Urology;  Laterality: Right;   EYE SURGERY Left age 47   HOLMIUM LASER APPLICATION Right 40/98/1191   Procedure: HOLMIUM LASER APPLICATION;  Surgeon: [name], MD;  [facility];  Service: Urology;  Laterality: Right;   NEPHROLITHOTOMY Right 10/05/2012   Procedure: NEPHROLITHOTOMY PERCUTANEOUS;  Surgeon: [name], MD;  [facility];  Service: Urology;  Laterality: Right;  WITH CYSTO AND SURGEON ACCESS    NEPHROLITHOTOMY Right 10/07/2012   Procedure: NEPHROLITHOTOMY PERCUTANEOUS SECOND LOOK. Stent exchange,Ureteroscopy ;  Surgeon: [name], MD;  [facility];  Service: Urology;  Laterality: Right;   RADIOACTIVE SEED IMPLANT N/A 11/12/2018   Procedure: RADIOACTIVE SEED IMPLANT/BRACHYTHERAPY IMPLANT;  Surgeon: [name], MD;  [facility];  Service: Urology;  Laterality: N/A;  [place] N/A 11/12/2018   Procedure: SPACE OAR INSTILLATION;  Surgeon: [name], MD;  [facility];  Service: Urology;  Laterality: N/A;   TOENAIL EXCISION     Patient Active Problem List   Diagnosis Date Noted   Spastic hemiplegia ([place]) 10/16/2021   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   Spastic hemiparesis of left nondominant side due to acute cerebral infarction [facility]) 04/03/2021   Malignant neoplasm of prostate (  [place]) 08/19/2018    ONSET DATE: 11/27/2021 (date of OT order)   REFERRING DIAG: G81.10 (ICD-10-CM) - Spastic hemiplegia, unspecified etiology, unspecified laterality ([place])  THERAPY DIAG:  Hemiplegia and hemiparesis following cerebral infarction affecting left non-dominant side (HCC)  Muscle weakness (generalized)  Stiffness of left hand, not elsewhere classified  Other symptoms and signs involving the musculoskeletal system  Other abnormalities of gait and mobility  Rationale for Evaluation and Treatment Habilitation  PERTINENT HISTORY:  Chronic L spastic hemi s/p R basal ganglia hemorrhage in 2006. Additional PMH: HTN, HLD, blindness in L eye w/ low vision in R eye, COPD h/o prostate cancer, OA and gout   PRECAUTIONS: Fall  SUBJECTIVE:  SUBJECTIVE STATEMENT: "It's not going to be an overnight miracle"  PAIN:  Are you having pain? No  PATIENT GOALS: "Raise up my (L) arm;" "try to get it to help me hold something"  OBJECTIVE:   TODAY'S TREATMENT:  Closed chain AAROM of forward chest press to approx 90 degrees of flexion in supine position; completed 2x10 w/ unweighted dowel and 1x10 w/ hands clasped. OT provided occ tactile cues/facilitation of elbow extension AAROM of L elbow flex/ext w/ hands clasped while seated EOM; completed 2x15 w/ verbal cues for incr elbow extension prn BUE towel slides on tabletop for shoulder flex and elbow ext to facilitate increased ROM and stretch; OT provided min verbal and tactile facilitation/cues for positioning and alignment during exercise. Emphasized achieving activation vs "pulling" on LUE AAROM of L forearm supination w/ hands clasped while seated EOM; completed x10, holding stretch at end-range for 5-10 seconds each rep for low load, long duration stretch OT performed gentle PROM of L hand and finger extension incorporating passive stretch held at end range to increase ROM and decrease joint stiffness for potential increase in motor function. Able to achieve fair MPJ extension and poor PIPJ extension of digits 2-4; PROM significantly limited by apparent PIPJ contractures of digits 2-5     PATIENT EDUCATION: Continued condition-specific education related to treatment interventions prn; initiated HEP for self-stretching and PROM Person educated: Patient Education method: Explanation, Demonstration, and Handouts Education comprehension: verbalized understanding and returned demonstration     HOME EXERCISE PROGRAM: MedBridge Code: [id number] Supine Shoulder Flexion AAROM with Hands  Clasped  - 2 x daily - 2 sets - 10 reps Seated AAROM Elbow Flexion/Extension with Clasped Hands  - 2 x daily - 1 sets - 10 reps - 5-10 sec hold Seated AAROM Elbow Supination/Pronation with Clasped Hands  - 2 x daily - 1 sets - 10 reps - 5-10 sec hold     GOALS: Goals reviewed with patient? Yes   SHORT TERM GOALS: Target date: 12/29/21   STG   Status:  1 Pt will demonstrate independence w/ HEP designed for self-stretching and ROM of LUE Baseline: Not currently completing HEP On-going      LONG TERM GOALS: Target date: 01/28/22   LTG   Status:  1 Pt will improve L shoulder flexion to at least 70 degrees of AROM to improve functional use of LUE during BADLs Baseline: 29* AROM (32* PROM) On-going  2 Pt will demonstrate use of LUE as gross assist during simple dressing, cleaning, or meal prep task Baseline: decreased functional use of LUE On-going  3 Pt will be independent w/ wear and care of orthosis or other positional considerations for management of L hand spasticity Baseline: has not worn resting hand splint in years On-going      ASSESSMENT:  CLINICAL IMPRESSION: Pt arrives for first treatment session following initial evaluation on 11/29/21 w/ OT reviewing goals and POC w/ pt who is agreeable to plan at this time. OT focused session today on identifying effective self-PROM and stretching exercises for shoulder and elbow w/ pt returning demonstration of all exercises to be included in initial HEP w/out significant difficulty. OT provided relevant education, specifically related to achieving gentle stretch vs pulling to force ROM w/ pt verbalizing understanding. OT also addressed stiffness of L hand w/ pt demonstrating notable stiffness of PIPJ extension, only able to achieve approx 70% of ROM passively. OT also supported wrist position in slight flexion to facilitate increased stretch w/ positive results. Pt educated on importance of completing stretching exercises consistently at home for  maximal benefit.   PERFORMANCE DEFICITS in functional skills including ADLs, IADLs, coordination, dexterity, proprioception, sensation, tone, ROM, strength, muscle spasms, FMC, GMC, mobility, balance, and UE functional use.   IMPAIRMENTS are limiting patient from ADLs and IADLs.    COMORBIDITIES has co-morbidities such as blindness in L eye  that affects occupational performance. Patient will benefit from skilled OT to address above impairments and improve overall function.     PLAN: OT FREQUENCY: 1x/week   OT DURATION: 6 weeks   PLANNED INTERVENTIONS: self care/ADL training, therapeutic exercise, therapeutic activity, neuromuscular re-education, manual therapy, passive range of motion, functional mobility training, splinting, electrical stimulation, fluidotherapy, moist heat, cryotherapy, patient/family education, and DME and/or AE instructions   RECOMMENDED OTHER SERVICES: None   CONSULTED AND AGREED WITH PLAN OF CARE: Patient   PLAN FOR NEXT SESSION: Continue to address ROM of shoulder flex/ext rot, elbow flex/ext, wrist and hand ext   Kathrine Cords, MSOT, OTR/L 12/03/2021, 9:22 AM

## 2021-12-18 ENCOUNTER — Ambulatory Visit: Payer: Medicaid Other | Admitting: Occupational Therapy

## 2021-12-18 ENCOUNTER — Encounter: Payer: Self-pay | Admitting: Occupational Therapy

## 2021-12-18 DIAGNOSIS — R29898 Other symptoms and signs involving the musculoskeletal system: Secondary | ICD-10-CM

## 2021-12-18 DIAGNOSIS — M25642 Stiffness of left hand, not elsewhere classified: Secondary | ICD-10-CM

## 2021-12-18 DIAGNOSIS — I69354 Hemiplegia and hemiparesis following cerebral infarction affecting left non-dominant side: Secondary | ICD-10-CM

## 2021-12-18 DIAGNOSIS — R2689 Other abnormalities of gait and mobility: Secondary | ICD-10-CM

## 2021-12-18 DIAGNOSIS — M6281 Muscle weakness (generalized): Secondary | ICD-10-CM

## 2021-12-18 NOTE — Therapy (Unsigned)
OUTPATIENT OCCUPATIONAL THERAPY TREATMENT NOTE   Patient Name: [name] MRN: [id number] DOB:[birth year], 61 y.o., male Today's Date: 12/18/2021  PCP: [name], MD REFERRING PROVIDER: [name], MD  END OF SESSION:   OT End of Session - 12/18/21 0805     Visit Number 3    Number of Visits 7    Date for OT Re-Evaluation 01/28/22    Authorization Type [place] Medicaid    OT Start Time 0801    OT Stop Time 0845    OT Time Calculation (min) 44 min    Activity Tolerance Patient tolerated treatment well    Behavior During Therapy WFL for tasks assessed/performed            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   History of gout    History of kidney stones    Hyperlipidemia    Hypertension    Prostate cancer [facility]) urologist-- dr [name];  oncologist-  dr [name]   dx 07-20-2017   Renal calculi    Stroke ([place]) sept 26,  20006    hx mini strokes in past, 1 large stroke   Weakness of left side of body 03/2005   cva residual   Weakness of one side of body    L sided weakness after stroke   Past Surgical History:  Procedure Laterality Date   COLONOSCOPY WITH PROPOFOL N/A 09/27/2015   Procedure: COLONOSCOPY WITH PROPOFOL;  Surgeon: [name], MD;  [facility];  Service: Endoscopy;  Laterality: N/A;   CYSTOSCOPY N/A 10/05/2012   Procedure: CYSTOSCOPY, retrograde and right stent placement;  Surgeon: [name], MD;  [facility]: [facility];  Service: Urology;  Laterality: N/A;   CYSTOSCOPY WITH RETROGRADE PYELOGRAM, URETEROSCOPY AND STENT PLACEMENT Right 04/13/2014   Procedure: CYSTOSCOPY WITH RETROGRADE PYELOGRAM, URETEROSCOPY AND  STENT PLACEMENT, LASER ABLATION OF SCAR TISSUE;  Surgeon: [name], MD;  [facility];  Service: Urology;  Laterality: Right;   EYE SURGERY Left age 54   HOLMIUM LASER APPLICATION Right 32/20/2542   Procedure: HOLMIUM LASER APPLICATION;  Surgeon: [name], MD;  [facility];  Service: Urology;  Laterality: Right;   NEPHROLITHOTOMY Right 10/05/2012   Procedure: NEPHROLITHOTOMY PERCUTANEOUS;  Surgeon: [name], MD;  [facility];  Service: Urology;  Laterality: Right;  WITH CYSTO AND SURGEON ACCESS    NEPHROLITHOTOMY Right 10/07/2012   Procedure: NEPHROLITHOTOMY PERCUTANEOUS SECOND LOOK. Stent exchange,Ureteroscopy ;  Surgeon: [name], MD;  [facility];  Service: Urology;  Laterality: Right;   RADIOACTIVE SEED IMPLANT N/A 11/12/2018   Procedure: RADIOACTIVE SEED IMPLANT/BRACHYTHERAPY IMPLANT;  Surgeon: [name], MD;  [facility];  Service: Urology;  Laterality: N/A;  [place] N/A 11/12/2018   Procedure: SPACE OAR INSTILLATION;  Surgeon: [name], MD;  [facility];  Service: Urology;  Laterality: N/A;   TOENAIL EXCISION     Patient Active Problem List   Diagnosis Date Noted   Spastic hemiplegia ([place]) 10/16/2021   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   Spastic hemiparesis of left nondominant side due to acute cerebral infarction [facility]) 04/03/2021   Malignant neoplasm of prostate (  [place]) 08/19/2018    ONSET DATE: 11/27/2021 (date of OT order)   REFERRING DIAG: G81.10 (ICD-10-CM) - Spastic hemiplegia, unspecified etiology, unspecified laterality ([place])  THERAPY DIAG:  Hemiplegia and hemiparesis following cerebral infarction affecting left non-dominant side (HCC)  Muscle weakness (generalized)  Stiffness of left hand, not elsewhere classified  Other symptoms and signs involving the musculoskeletal system  Other abnormalities of gait and mobility  Rationale for Evaluation and Treatment Habilitation  PERTINENT HISTORY:  Chronic L spastic hemi s/p R basal ganglia hemorrhage in 2006. Additional PMH: HTN, HLD, blindness in L eye w/ low vision in R eye, COPD h/o prostate cancer, OA and gout   PRECAUTIONS: Fall  SUBJECTIVE:  SUBJECTIVE STATEMENT: Pt reports feeling a little achy this morning; states it feels like arthritis-type pain  PAIN:  Are you having pain? No  PATIENT GOALS: "Raise up my (L) arm;" "try to get it to help me hold something"  OBJECTIVE:   TODAY'S TREATMENT:  Closed-chain AAROM of shoulder flexion to approximately 70 degrees in supine positions w/ hands clasped; completed 2x15, holding end-range about 5 sec during 2nd set for increased stretch Elbow extension in supine position w/ 2# dumbbell for increased weight and somatosensory input, holding stretch at end range for about 2 sec each rep; completed 2 sets of 10 reps w/ OT providing occ verbal/tactile cues for full arc of motion each rep OT performed gentle PROM of shoulder flexion w/ elbow flexed and shoulder external rotation, incorporating passive stretch held at end range to increase ROM and decrease joint stiffness for potential increase in motor function. Able to achieve approx *** of ***; pt tolerated exercise w/out discomfort // discomfort improved w/ repetition; PROM limited by ***  Single-arm AROM of L shoulder external rotation w/ active-assist at end-range each rep     PATIENT EDUCATION: Continued condition-specific education, particularly related to anti-spasticity positioning for L hand, recommending rolled up washcloth and providing demonstration  Person educated: Patient Education method: Explanation, Demonstration, and Handouts Education comprehension: verbalized understanding and returned demonstration     HOME EXERCISE PROGRAM: MedBridge Code: [id number] Supine Shoulder Flexion AAROM with Hands Clasped  - 2 x daily - 2 sets - 10 reps Seated AAROM Elbow Flexion/Extension with Clasped Hands  - 2 x daily - 1 sets - 10  reps - 5-10 sec hold Seated AAROM Elbow Supination/Pronation with Clasped Hands  - 2 x daily - 1 sets - 10 reps - 5-10 sec hold     GOALS: Goals reviewed with patient? Yes   SHORT TERM GOALS: Target date: 12/29/21   STG   Status:  1 Pt will demonstrate independence w/ HEP designed for self-stretching and ROM of LUE Baseline: Not currently completing HEP Progressing      LONG TERM GOALS: Target date: 01/28/22   LTG   Status:  1 Pt will improve L shoulder flexion to at least 70 degrees of AROM to improve functional use of LUE during BADLs Baseline: 29* AROM (32* PROM) Progressing  2 Pt will demonstrate use of LUE as gross assist during simple dressing, cleaning, or meal prep task Baseline: decreased functional use of LUE Progressing  3 Pt will be independent w/ wear and care of orthosis or other positional considerations for management of L hand spasticity Baseline: has not worn resting hand splint in years Progressing      ASSESSMENT:   CLINICAL IMPRESSION: ***   PERFORMANCE DEFICITS in functional skills including ADLs, IADLs, coordination, dexterity, proprioception, sensation, tone, ROM, strength, muscle  spasms, [place], GMC, mobility, balance, and UE functional use.   IMPAIRMENTS are limiting patient from ADLs and IADLs.    COMORBIDITIES has co-morbidities such as blindness in L eye  that affects occupational performance. Patient will benefit from skilled OT to address above impairments and improve overall function.     PLAN: OT FREQUENCY: 1x/week   OT DURATION: 6 weeks   PLANNED INTERVENTIONS: self care/ADL training, therapeutic exercise, therapeutic activity, neuromuscular re-education, manual therapy, passive range of motion, functional mobility training, splinting, electrical stimulation, fluidotherapy, moist heat, cryotherapy, patient/family education, and DME and/or AE instructions   RECOMMENDED OTHER SERVICES: None   CONSULTED AND AGREED WITH PLAN OF CARE: Patient    PLAN FOR NEXT SESSION: Continue to address ROM of shoulder flex/ext rot, elbow flex/ext, wrist and hand ext   Kathrine Cords, MSOT, OTR/L  12/18/2021, 1:15 PM

## 2021-12-24 ENCOUNTER — Ambulatory Visit: Payer: Medicaid Other | Admitting: Occupational Therapy

## 2021-12-25 ENCOUNTER — Ambulatory Visit: Payer: Medicaid Other | Admitting: Occupational Therapy

## 2021-12-25 ENCOUNTER — Encounter: Payer: Self-pay | Admitting: Occupational Therapy

## 2021-12-25 DIAGNOSIS — M6281 Muscle weakness (generalized): Secondary | ICD-10-CM

## 2021-12-25 DIAGNOSIS — I69354 Hemiplegia and hemiparesis following cerebral infarction affecting left non-dominant side: Secondary | ICD-10-CM

## 2021-12-25 DIAGNOSIS — M25642 Stiffness of left hand, not elsewhere classified: Secondary | ICD-10-CM

## 2021-12-25 DIAGNOSIS — R2689 Other abnormalities of gait and mobility: Secondary | ICD-10-CM

## 2021-12-25 DIAGNOSIS — R29898 Other symptoms and signs involving the musculoskeletal system: Secondary | ICD-10-CM

## 2021-12-25 NOTE — Therapy (Signed)
OUTPATIENT OCCUPATIONAL THERAPY TREATMENT NOTE   Patient Name: [name] MRN: [id number] DOB:07-06-1960, 61 y.o., male Today's Date: 12/25/2021  PCP: Fleet Contras, MD REFERRING PROVIDER: Claudette Laws, MD  END OF SESSION:   OT End of Session - 12/25/21 0808     Visit Number 4    Number of Visits 7    Date for OT Re-Evaluation 01/28/22    Authorization Type [place] Medicaid    OT Start Time 0802    OT Stop Time 0844    OT Time Calculation (min) 42 min    Activity Tolerance Patient tolerated treatment well    Behavior During Therapy WFL for tasks assessed/performed            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   History of gout    History of kidney stones    Hyperlipidemia    Hypertension    Prostate cancer [facility]) urologist-- dr Ezzard Flax;  oncologist-  dr Kathrynn Running   dx 07-20-2017   Renal calculi    Stroke (HCC) sept 26,  20006    hx mini strokes in past, 1 large stroke   Weakness of left side of body 03/2005   cva residual   Weakness of one side of body    L sided weakness after stroke   Past Surgical History:  Procedure Laterality Date   COLONOSCOPY WITH PROPOFOL N/A 09/27/2015   Procedure: COLONOSCOPY WITH PROPOFOL;  Surgeon: Willis Modena, MD;  [facility];  Service: Endoscopy;  Laterality: N/A;   CYSTOSCOPY N/A 10/05/2012   Procedure: CYSTOSCOPY, retrograde and right stent placement;  Surgeon: [name], MD;  [facility];  Service: Urology;  Laterality: N/A;   CYSTOSCOPY WITH RETROGRADE PYELOGRAM, URETEROSCOPY AND STENT PLACEMENT Right 04/13/2014   Procedure: CYSTOSCOPY WITH RETROGRADE PYELOGRAM, URETEROSCOPY AND  STENT PLACEMENT, LASER ABLATION OF SCAR TISSUE;  Surgeon: [name], MD;  [facility];  Service: Urology;  Laterality: Right;   EYE SURGERY Left age 25   HOLMIUM LASER APPLICATION Right 04/13/2014   Procedure: HOLMIUM LASER APPLICATION;  Surgeon: [name], MD;  [facility];  Service: Urology;  Laterality: Right;   NEPHROLITHOTOMY Right 10/05/2012   Procedure: NEPHROLITHOTOMY PERCUTANEOUS;  Surgeon: [name], MD;  [facility];  Service: Urology;  Laterality: Right;  WITH CYSTO AND SURGEON ACCESS    NEPHROLITHOTOMY Right 10/07/2012   Procedure: NEPHROLITHOTOMY PERCUTANEOUS SECOND LOOK. Stent exchange,Ureteroscopy ;  Surgeon: [name], MD;  [facility];  Service: Urology;  Laterality: Right;   RADIOACTIVE SEED IMPLANT N/A 11/12/2018   Procedure: RADIOACTIVE SEED IMPLANT/BRACHYTHERAPY IMPLANT;  Surgeon: [name], MD;  [facility];  Service: Urology;  Laterality: N/A;  90 MINS   SPACE OAR INSTILLATION N/A 11/12/2018   Procedure: SPACE OAR INSTILLATION;  Surgeon: [name], MD;  [facility];  Service: Urology;  Laterality: N/A;   TOENAIL EXCISION     Patient Active Problem List   Diagnosis Date Noted   Spastic hemiplegia (HCC) 10/16/2021   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   Spastic hemiparesis of left nondominant side due to acute cerebral infarction [facility]) 04/03/2021   Malignant neoplasm of prostate (  HCC) 08/19/2018    ONSET DATE: 11/27/2021 (date of OT order)   REFERRING DIAG: G81.10 (ICD-10-CM) - Spastic hemiplegia, unspecified etiology, unspecified laterality (HCC)  THERAPY DIAG:  Hemiplegia and hemiparesis following cerebral infarction affecting left non-dominant side (HCC)  Muscle weakness (generalized)  Stiffness of left hand, not elsewhere classified  Other symptoms and signs involving the musculoskeletal system  Other abnormalities of gait and mobility  Rationale for Evaluation and Treatment Habilitation  PERTINENT HISTORY:  Chronic L spastic hemi s/p R basal ganglia hemorrhage in 2006. Additional PMH: HTN, HLD, blindness in L eye w/ low vision in R eye, COPD h/o prostate cancer, OA and gout   PRECAUTIONS: Fall  SUBJECTIVE:  SUBJECTIVE STATEMENT: Pt reports it's hard for him to get new clothes, but that he does sometimes go to [employer]  PAIN:  Are you having pain? No  PATIENT GOALS: "Raise up my (L) arm;" "try to get it to help me hold something"  OBJECTIVE:   TODAY'S TREATMENT - 12/25/21:  Towel slides on tabletop w/ BUEs for shoulder flex/elbow ext to facilitate increased ROM and stretch; completed 1x10 of forward flexion and 1x10 of circumduction (clockwise and counterclockwise). OT provided initial verbal cues and demonstration for shoulder alignment during exercise OT performed gentle PROM of wrist flex/ext followed by composite finger ext, incorporating passive stretch held at end range to increase ROM and decrease joint stiffness. Able to achieve wrist ROM WFL and fair MPJ extension w/ PIPJs flexed; finger PIPJ extension significantly limited by increased tone Closed-chain AAROM of elbow extension holding top of cane; able to achieve about 60 degrees consistently; completed 2x15 Single-arm elbow extension in gravity-assisted position w/ 2lb dumbbell 1x10; OT provided verbal cues for full range each rep and to decrease compensatory trunk movements     PATIENT EDUCATION: Ongoing condition-specific education prn Person educated: Patient Education method: Explanation Education comprehension: verbalized understanding     HOME EXERCISE PROGRAM: MedBridge Code: [id number] Supine Shoulder Flexion AAROM with Hands Clasped  - 2 x daily - 2 sets - 10 reps Seated AAROM Elbow Flexion/Extension with Clasped Hands  - 2 x daily - 1 sets - 10 reps - 5-10 sec hold Seated AAROM Elbow Supination/Pronation with Clasped Hands  - 2 x daily - 1 sets - 10 reps - 5-10 sec hold     GOALS: Goals reviewed with patient?  Yes   SHORT TERM GOALS: Target date: 12/29/21   STG   Status:  1 Pt will demonstrate independence w/ HEP designed for self-stretching and ROM of LUE Baseline: Not currently completing HEP Progressing      LONG TERM GOALS: Target date: 01/28/22   LTG   Status:  1 Pt will improve L shoulder flexion to at least 70 degrees of AROM to improve functional use of LUE during BADLs Baseline: 29* AROM (32* PROM) Progressing  2 Pt will demonstrate use of LUE as gross assist during simple dressing, cleaning, or meal prep task Baseline: decreased functional use of LUE Progressing  3 Pt will be independent w/ wear and care of orthosis or other positional considerations for management of L hand spasticity Baseline: has not worn resting hand splint in years Progressing      ASSESSMENT:   CLINICAL IMPRESSION: OT continued to focus on ROM of LUE for decreased stiffness and increased gross assist in functional tasks this session. Increased weight incorporated into elbow extension exercises w/ neutral results due to compensatory patterns; pt w/ most success achieving full arc of motion while in  supine w/ gravity assist. Pt also demonstrates contracture of L long and ring finger PIPJ w/ OT experiencing significant difficulty achieving PROM. Considering this, OT reviewed appropriate options for positional considerations for L hand while at home.   PERFORMANCE DEFICITS in functional skills including ADLs, IADLs, coordination, dexterity, proprioception, sensation, tone, ROM, strength, muscle spasms, FMC, GMC, mobility, balance, and UE functional use.   IMPAIRMENTS are limiting patient from ADLs and IADLs.    COMORBIDITIES: has co-morbidities such as blindness in L eye  that affects occupational performance. Patient will benefit from skilled OT to address above impairments and improve overall function.     PLAN: OT FREQUENCY: 1x/week   OT DURATION: 6 weeks   PLANNED INTERVENTIONS: self care/ADL training,  therapeutic exercise, therapeutic activity, neuromuscular re-education, manual therapy, passive range of motion, functional mobility training, splinting, electrical stimulation, fluidotherapy, moist heat, cryotherapy, patient/family education, and DME and/or AE instructions   RECOMMENDED OTHER SERVICES: None   CONSULTED AND AGREED WITH PLAN OF CARE: Patient   PLAN FOR NEXT SESSION: Continue to address ROM of shoulder flex/ext rot, elbow flex/ext, wrist and hand ext   Rosie Fate, MSOT, OTR/L  12/25/2021, 8:45 AM

## 2021-12-31 ENCOUNTER — Encounter: Payer: Self-pay | Admitting: Occupational Therapy

## 2021-12-31 ENCOUNTER — Ambulatory Visit: Payer: Medicaid Other | Attending: Physical Medicine & Rehabilitation | Admitting: Occupational Therapy

## 2021-12-31 DIAGNOSIS — R29898 Other symptoms and signs involving the musculoskeletal system: Secondary | ICD-10-CM | POA: Insufficient documentation

## 2021-12-31 DIAGNOSIS — I69354 Hemiplegia and hemiparesis following cerebral infarction affecting left non-dominant side: Secondary | ICD-10-CM | POA: Diagnosis not present

## 2021-12-31 DIAGNOSIS — M25642 Stiffness of left hand, not elsewhere classified: Secondary | ICD-10-CM | POA: Diagnosis present

## 2021-12-31 DIAGNOSIS — R2689 Other abnormalities of gait and mobility: Secondary | ICD-10-CM | POA: Diagnosis present

## 2021-12-31 DIAGNOSIS — M6281 Muscle weakness (generalized): Secondary | ICD-10-CM | POA: Insufficient documentation

## 2021-12-31 NOTE — Therapy (Signed)
OUTPATIENT OCCUPATIONAL THERAPY TREATMENT NOTE   Patient Name: FLORENTINO LAABS MRN: [id number] DOB:1960/08/26, 61 y.o., male Today's Date: 12/31/2021  PCP: [name], MD REFERRING PROVIDER: [name], MD  END OF SESSION:   OT End of Session - 12/31/21 0849     Visit Number 5    Number of Visits 7    Date for OT Re-Evaluation 01/28/22    Authorization Type [place] [employer]    OT Start Time 0845    OT Stop Time 0925    OT Time Calculation (min) 40 min    Activity Tolerance Patient tolerated treatment well    Behavior During Therapy WFL for tasks assessed/performed            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    blind left eye   COPD (chronic obstructive pulmonary disease) ([place])    GERD (gastroesophageal reflux disease)    History of CVA 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   History of gout    History of kidney stones    Hyperlipidemia    Hypertension    Prostate cancer [facility]) urologist-- dr [name];  oncologist-  dr [name]   dx 07-20-2017   Renal calculi    Stroke ([place]) sept 26,  20006    hx mini strokes in past, 1 large stroke   Weakness of left side of body 03/2005   cva residual   Weakness of one side of body    L sided weakness after stroke   Past Surgical History:  Procedure Laterality Date   COLONOSCOPY WITH PROPOFOL N/A 09/27/2015   Procedure: COLONOSCOPY WITH PROPOFOL;  Surgeon: [name], MD;  [facility];  Service: Endoscopy;  Laterality: N/A;   CYSTOSCOPY N/A 10/05/2012   Procedure: CYSTOSCOPY, retrograde and right stent placement;  Surgeon: [name], MD;  [facility];  Service: Urology;  Laterality: N/A;   CYSTOSCOPY WITH RETROGRADE PYELOGRAM, URETEROSCOPY AND STENT PLACEMENT Right 04/13/2014   Procedure: CYSTOSCOPY WITH RETROGRADE PYELOGRAM, URETEROSCOPY AND STENT  PLACEMENT, LASER ABLATION OF SCAR TISSUE;  Surgeon: [name], MD;  [facility];  Service: Urology;  Laterality: Right;   EYE SURGERY Left age 32   HOLMIUM LASER APPLICATION Right 33/35/4562   Procedure: HOLMIUM LASER APPLICATION;  Surgeon: [name], MD;  [facility];  Service: Urology;  Laterality: Right;   NEPHROLITHOTOMY Right 10/05/2012   Procedure: NEPHROLITHOTOMY PERCUTANEOUS;  Surgeon: [name], MD;  [facility];  Service: Urology;  Laterality: Right;  WITH CYSTO AND SURGEON ACCESS    NEPHROLITHOTOMY Right 10/07/2012   Procedure: NEPHROLITHOTOMY PERCUTANEOUS SECOND LOOK. Stent exchange,Ureteroscopy ;  Surgeon: [name], MD;  [facility];  Service: Urology;  Laterality: Right;   RADIOACTIVE SEED IMPLANT N/A 11/12/2018   Procedure: RADIOACTIVE SEED IMPLANT/BRACHYTHERAPY IMPLANT;  Surgeon: [name], MD;  [facility];  Service: Urology;  Laterality: N/A;  [place] N/A 11/12/2018   Procedure: SPACE OAR INSTILLATION;  Surgeon: [name], MD;  [facility];  Service: Urology;  Laterality: N/A;   TOENAIL EXCISION     Patient Active Problem List   Diagnosis Date Noted   Spastic hemiplegia ([place]) 10/16/2021   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   Spastic hemiparesis of left nondominant side due to acute cerebral infarction [facility]) 04/03/2021   Malignant neoplasm of prostate (  [place]) 08/19/2018    ONSET DATE: 11/27/2021 (date of OT order)   REFERRING DIAG: G81.10 (ICD-10-CM) - Spastic hemiplegia, unspecified etiology, unspecified laterality ([place])  THERAPY DIAG:  Hemiplegia and hemiparesis following cerebral infarction affecting left non-dominant side (HCC)  Muscle weakness (generalized)  Stiffness of left hand, not elsewhere classified  Other symptoms and signs involving the musculoskeletal system  Other abnormalities of gait and mobility  Rationale for Evaluation and Treatment Habilitation  PERTINENT HISTORY:  Chronic L spastic hemi s/p R basal ganglia hemorrhage in 2006. Additional PMH: HTN, HLD, blindness in L eye w/ low vision in R eye, COPD h/o prostate cancer, OA and gout   PRECAUTIONS: Fall  SUBJECTIVE:  SUBJECTIVE STATEMENT: "I'm doing"  PAIN:  Are you having pain? No  PATIENT GOALS: "Raise up my (L) arm;" "try to get it to help me hold something"  OBJECTIVE:   TODAY'S TREATMENT - 12/31/21:  Weighted pec and elbow ext stretch w/ 3 lb weight around L wrist; LUE abducted and towel roll under upper arm w/ pt in supine position for scapular support and alignment. Completed 5x holding end range for about 10 sec w/ OT providing verbal/tactile cues for alignment prn Closed-chain forward chest press w/ cane while in supine; completed 2x10 w/ OT providing min verbal cues for full arc of motion, emphasizing flexion to chest level and elbow ext L shoulder abduction w/ cane while in supine; completed 2x10 w/ decreasing verbal and visual/tactile cue for alignment toward end-range Closed-chain shoulder flexion w/ cane while in supine; completed 2x10 w/ OT providing readjustment of UE positioning prn as pt tended to horizontally abduct toward LUE. OT also provided cues for increased elbow extension prn; able to achieve good ROM each rep OT performed gentle PROM of L shoulder flexion and L shoulder ER, incorporating passive stretch held at end range. Completed x10 each plane of movement; able to achieve ROM [facility] w/out discomfort     PATIENT EDUCATION: Ongoing condition-specific education prn; reviewed HEP and positional considerations for L hand Person educated: Patient Education method: Explanation Education comprehension: verbalized understanding     HOME EXERCISE PROGRAM: MedBridge Code: [id number] - Supine Shoulder Flexion AAROM with Hands Clasped  - 2 x daily - 2 sets - 10 reps - Supine Shoulder Press AAROM in Abduction with Dowel  - 2 x daily - 2 sets - 10 reps - Supine Shoulder Flexion AAROM  with Dowel  - 2 x daily - 2 sets - 10 reps - Supine Shoulder Abduction AAROM with Dowel  - 2 x daily - 2 sets - 10 reps - Seated AAROM Elbow Supination/Pronation with Clasped Hands  - 2 x daily - 1 sets - 10 reps - 5-10 sec hold     GOALS: Goals reviewed with patient? Yes   SHORT TERM GOALS: Target date: 12/29/21   STG   Status:  1 Pt will demonstrate independence w/ HEP designed for self-stretching and ROM of LUE Baseline: Not currently completing HEP Met - 12/31/21      LONG TERM GOALS: Target date: 01/28/22   LTG   Status:  1 Pt will improve L shoulder flexion to at least 70 degrees of AROM to improve functional use of LUE during BADLs Baseline: 29* AROM (32* PROM) Progressing  2 Pt will demonstrate use of LUE as gross assist during simple dressing, cleaning, or meal prep task Baseline: decreased functional use of LUE Progressing  3 Pt will be independent w/ wear and care of orthosis or other positional  considerations for management of L hand spasticity Baseline: has not worn resting hand splint in years Progressing      ASSESSMENT:   CLINICAL IMPRESSION: OT continued to focus on ROM of LUE for decreased stiffness and increased gross assist in functional tasks this session. Pt positioned in supine for increased arc of motion w/ improved scapular and shoulder alignment. Pt also requested going on-hold until receiving 2nd round of Botox injections at the end of this month; OT discussed typical timeframe for maximum therapeutic benefit being 3-4 after injections and pt verbalized understanding. Will continue OP OT and then place pt on-hold, deferring pt to focus on HEP at home before resuming in August.   PERFORMANCE DEFICITS in functional skills including ADLs, IADLs, coordination, dexterity, proprioception, sensation, tone, ROM, strength, muscle spasms, FMC, GMC, mobility, balance, and UE functional use.   IMPAIRMENTS are limiting patient from ADLs and IADLs.    COMORBIDITIES: has  co-morbidities such as blindness in L eye  that affects occupational performance. Patient will benefit from skilled OT to address above impairments and improve overall function.     PLAN: OT FREQUENCY: 1x/week   OT DURATION: 6 weeks   PLANNED INTERVENTIONS: self care/ADL training, therapeutic exercise, therapeutic activity, neuromuscular re-education, manual therapy, passive range of motion, functional mobility training, splinting, electrical stimulation, fluidotherapy, moist heat, cryotherapy, patient/family education, and DME and/or AE instructions   RECOMMENDED OTHER SERVICES: None   CONSULTED AND AGREED WITH PLAN OF CARE: Patient   PLAN FOR NEXT SESSION: Continue to address ROM of shoulder flex/ext rot, elbow flex/ext, wrist and hand ext   Kathrine Cords, MSOT, OTR/L  12/31/2021, 2:35 PM

## 2022-01-07 ENCOUNTER — Encounter: Payer: Self-pay | Admitting: Occupational Therapy

## 2022-01-07 ENCOUNTER — Ambulatory Visit: Payer: Medicaid Other | Admitting: Occupational Therapy

## 2022-01-07 DIAGNOSIS — I69354 Hemiplegia and hemiparesis following cerebral infarction affecting left non-dominant side: Secondary | ICD-10-CM | POA: Diagnosis not present

## 2022-01-07 DIAGNOSIS — M6281 Muscle weakness (generalized): Secondary | ICD-10-CM

## 2022-01-07 DIAGNOSIS — M25642 Stiffness of left hand, not elsewhere classified: Secondary | ICD-10-CM

## 2022-01-07 DIAGNOSIS — R2689 Other abnormalities of gait and mobility: Secondary | ICD-10-CM

## 2022-01-07 DIAGNOSIS — R29898 Other symptoms and signs involving the musculoskeletal system: Secondary | ICD-10-CM

## 2022-01-07 NOTE — Therapy (Unsigned)
OUTPATIENT OCCUPATIONAL THERAPY TREATMENT NOTE   Patient Name: [name] MRN: [id number] DOB:June 22, 1961, 61 y.o., male Today's Date: 01/08/2022  PCP: [name], MD REFERRING PROVIDER: [name], MD  END OF SESSION:   OT End of Session - 01/07/22 1143     Visit Number 6    Number of Visits 7    Date for OT Re-Evaluation 01/28/22    Authorization Type [place] Medicaid    OT Start Time 0800    OT Stop Time 0840    OT Time Calculation (min) 40 min    Activity Tolerance Patient tolerated treatment well    Behavior During Therapy WFL for tasks assessed/performed            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   History of gout    History of kidney stones    Hyperlipidemia    Hypertension    Prostate cancer [facility]) urologist-- dr [name];  oncologist-  dr [name]   dx 07-20-2017   Renal calculi    Stroke ([place]) sept 26,  20006    hx mini strokes in past, 1 large stroke   Weakness of left side of body 03/2005   cva residual   Weakness of one side of body    L sided weakness after stroke   Past Surgical History:  Procedure Laterality Date   COLONOSCOPY WITH PROPOFOL N/A 09/27/2015   Procedure: COLONOSCOPY WITH PROPOFOL;  Surgeon: [name], MD;  [facility];  Service: Endoscopy;  Laterality: N/A;   CYSTOSCOPY N/A 10/05/2012   Procedure: CYSTOSCOPY, retrograde and right stent placement;  Surgeon: [name], MD;  [facility];  Service: Urology;  Laterality: N/A;   CYSTOSCOPY WITH RETROGRADE PYELOGRAM, URETEROSCOPY AND STENT PLACEMENT Right 04/13/2014   Procedure: CYSTOSCOPY WITH RETROGRADE PYELOGRAM, URETEROSCOPY AND  STENT PLACEMENT, LASER ABLATION OF SCAR TISSUE;  Surgeon: [name], MD;  [facility];  Service: Urology;  Laterality: Right;   EYE SURGERY Left age 67   HOLMIUM LASER APPLICATION Right 51/88/4166   Procedure: HOLMIUM LASER APPLICATION;  Surgeon: [name], MD;  [facility];  Service: Urology;  Laterality: Right;   NEPHROLITHOTOMY Right 10/05/2012   Procedure: NEPHROLITHOTOMY PERCUTANEOUS;  Surgeon: [name], MD;  [facility];  Service: Urology;  Laterality: Right;  WITH CYSTO AND SURGEON ACCESS    NEPHROLITHOTOMY Right 10/07/2012   Procedure: NEPHROLITHOTOMY PERCUTANEOUS SECOND LOOK. Stent exchange,Ureteroscopy ;  Surgeon: [name], MD;  [facility];  Service: Urology;  Laterality: Right;   RADIOACTIVE SEED IMPLANT N/A 11/12/2018   Procedure: RADIOACTIVE SEED IMPLANT/BRACHYTHERAPY IMPLANT;  Surgeon: [name], MD;  [facility];  Service: Urology;  Laterality: N/A;  [place] N/A 11/12/2018   Procedure: SPACE OAR INSTILLATION;  Surgeon: [name], MD;  [facility];  Service: Urology;  Laterality: N/A;   TOENAIL EXCISION     Patient Active Problem List   Diagnosis Date Noted   Spastic hemiplegia ([place]) 10/16/2021   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   Spastic hemiparesis of left nondominant side due to acute cerebral infarction [facility]) 04/03/2021   Malignant neoplasm of prostate (  [place]) 08/19/2018    ONSET DATE: 11/27/2021 (date of OT order)   REFERRING DIAG: G81.10 (ICD-10-CM) - Spastic hemiplegia, unspecified etiology, unspecified laterality ([place])  THERAPY DIAG:  Hemiplegia and hemiparesis following cerebral infarction affecting left non-dominant side (HCC)  Muscle weakness (generalized)  Stiffness of left hand, not elsewhere classified  Other symptoms and signs involving the musculoskeletal system  Other abnormalities of gait and mobility  Rationale for Evaluation and Treatment Habilitation  PERTINENT HISTORY:  Chronic L spastic hemi s/p R basal ganglia hemorrhage in 2006. Additional PMH: HTN, HLD, blindness in L eye w/ low vision in R eye, COPD h/o prostate cancer, OA and gout   PRECAUTIONS: Fall  SUBJECTIVE:  SUBJECTIVE STATEMENT: Pt reports his last Botox injection was about 2 months before starting therapy, not on 11/27/21  PAIN:  Are you having pain? No  PATIENT GOALS: "Raise up my (L) arm;" "try to get it to help me hold something"  OBJECTIVE:   UPPER EXTREMITY ROM      Active ROM Left Eval - 6/1 Left 7/10  Shoulder flexion 29 47  Shoulder abduction 21 36  Shoulder adduction 0   Shoulder extension 28 44  Shoulder IR     Shoulder ER     Elbow flexion 95 121  Elbow extension 45 34  Wrist flexion 0 25  Wrist extension 26 55  Wrist ulnar deviation     Wrist radial deviation     Wrist pronation     Wrist supination   22  (Blank rows = not tested)     Passive ROM Left Eval - 6/1 Left 7/10  Shoulder flexion 32 78  Shoulder abduction     Shoulder adduction     Shoulder extension     Shoulder IR     Shoulder ER     Elbow flexion 135 144  Elbow extension 16 0  Wrist flexion 58 63  Wrist extension 53 54  Wrist ulnar deviation     Wrist radial deviation     Wrist pronation     Wrist supination   58  (Blank rows = not tested)    TODAY'S TREATMENT - 01/07/22:  OT performed gentle PROM of L elbow flex/ext, shoulder ER, forearm supination, and wrist extension, incorporating passive stretch held at end range. Completed x10 slowly in each plane of movement; able to achieve ROM [facility] w/out discomfort in all planes other than forearm supination, only achieving approx 70% of ROM consistently Self-PROM of L forearm supination completed 5x slowly w/ stretch held at end-range; OT provided modeling, verbal cues, and assist w/ repositioning of hold on LUE prn     PATIENT EDUCATION: Discussed POC and reviewed and updated HEP in preparation for period of pt going on-hold Person  educated: Patient Education method: Explanation Education comprehension: verbalized understanding     HOME EXERCISE PROGRAM: MedBridge Code: [id number] - Supine Shoulder Flexion AAROM with Hands Clasped  - 2 x daily - 2 sets - 10 reps - Supine Shoulder Press AAROM in Abduction with Dowel  - 2 x daily - 2 sets - 10 reps - Supine Shoulder Flexion AAROM with Dowel  - 2 x daily - 2 sets - 10 reps - Supine Shoulder Abduction AAROM with Dowel  - 2 x daily - 2 sets - 10 reps - Forearm Supination Stretch  - 2 x daily - 2 sets - 5 reps - 5-10 hold     GOALS: Goals reviewed with patient? Yes   SHORT TERM  GOALS: Target date: 12/29/21   STG   Status:  1 Pt will demonstrate independence w/ HEP designed for self-stretching and ROM of LUE Baseline: Not currently completing HEP Met - 12/31/21      LONG TERM GOALS: Target date: 01/28/22   LTG   Status:  1 Pt will improve L shoulder flexion to at least 70 degrees of AROM to improve functional use of LUE during BADLs Baseline: 29* AROM (32* PROM) Progressing  2 Pt will demonstrate use of LUE as gross assist during simple dressing, cleaning, or meal prep task Baseline: decreased functional use of LUE Progressing  3 Pt will be independent w/ wear and care of orthosis or other positional considerations for management of L hand spasticity Baseline: has not worn resting hand splint in years Progressing      ASSESSMENT:   CLINICAL IMPRESSION: Pt arrives for remaining session prior to going on-hold until receiving Botox injection on 01/29/22. This will allow pt time to focus on completing HEP independently and then returning to resume therapy for maximum benefit s/p injection. In preparation for d/c OT reassessed and discussed progress toward goals, reviewing HEP w/ pt able to return demonstration of all exercises. OT also continued to address PROM, focusing on most functionally limiting movements of shoulder, elbow, forearm, and wrist. Hand contractures continue  to be limiting for functional use of L hand, but pt has made notable improvements w/ both AROM and PROM of LUE since start of therapy. OT emphasized importance of continuing HEP and pt was scheduled to return to OP OT on 02/11/22.   PERFORMANCE DEFICITS in functional skills including ADLs, IADLs, coordination, dexterity, proprioception, sensation, tone, ROM, strength, muscle spasms, FMC, GMC, mobility, balance, and UE functional use.   IMPAIRMENTS are limiting patient from ADLs and IADLs.    COMORBIDITIES: has co-morbidities such as blindness in L eye  that affects occupational performance. Patient will benefit from skilled OT to address above impairments and improve overall function.     PLAN: OT FREQUENCY: 1x/week   OT DURATION: 6 weeks   PLANNED INTERVENTIONS: self care/ADL training, therapeutic exercise, therapeutic activity, neuromuscular re-education, manual therapy, passive range of motion, functional mobility training, splinting, electrical stimulation, fluidotherapy, moist heat, cryotherapy, patient/family education, and DME and/or AE instructions   RECOMMENDED OTHER SERVICES: None   CONSULTED AND AGREED WITH PLAN OF CARE: Patient   PLAN FOR NEXT SESSION: Pt to be put on hold after today until receiving botox for LUE; will return to resume course of therapy at that time   Kathrine Cords, MSOT, OTR/L  01/08/2022, 11:44 AM

## 2022-01-28 ENCOUNTER — Ambulatory Visit: Payer: Medicaid Other | Admitting: Podiatry

## 2022-01-28 ENCOUNTER — Encounter: Payer: Self-pay | Admitting: Podiatry

## 2022-01-28 DIAGNOSIS — Q828 Other specified congenital malformations of skin: Secondary | ICD-10-CM

## 2022-01-28 DIAGNOSIS — M79672 Pain in left foot: Secondary | ICD-10-CM | POA: Diagnosis not present

## 2022-01-28 DIAGNOSIS — B351 Tinea unguium: Secondary | ICD-10-CM

## 2022-01-28 DIAGNOSIS — M79671 Pain in right foot: Secondary | ICD-10-CM

## 2022-01-28 DIAGNOSIS — M79675 Pain in left toe(s): Secondary | ICD-10-CM | POA: Diagnosis not present

## 2022-01-28 DIAGNOSIS — L84 Corns and callosities: Secondary | ICD-10-CM | POA: Diagnosis not present

## 2022-01-28 DIAGNOSIS — M79674 Pain in right toe(s): Secondary | ICD-10-CM

## 2022-01-28 NOTE — Progress Notes (Unsigned)
  Subjective:  Patient ID: Genia Del, male    DOB: 1961-04-28,  MRN: [id number]  Genia Del presents to clinic today for painful porokeratotic lesion(s) b/l lower extremities and painful mycotic toenails that limit ambulation. Painful toenails interfere with ambulation. Aggravating factors include wearing enclosed shoe gear. Pain is relieved with periodic professional debridement. Painful porokeratotic lesions are aggravated when weightbearing with and without shoegear. Pain is relieved with periodic professional debridement.  New problem(s): None.   PCP is [name], MD , and last visit was June, 2023.  No Known Allergies  Review of Systems: Negative except as noted in the HPI.  Objective: No changes noted in today's physical examination. Constitutional KHALEEM BURCHILL is a pleasant 61 y.o. African American male, obese in NAD. AAO x 3.   Vascular Capillary refill time to digits immediate b/l. Palpable pedal pulses b/l LE. Pedal hair sparse. No pain with calf compression b/l. Trace edema noted BLE. No cyanosis or clubbing noted b/l LE.  Neurologic Normal speech. Oriented to person, place, and time. Protective sensation intact 5/5 intact bilaterally with 10g monofilament b/l.  Dermatologic Pedal integument with normal turgor, texture and tone BLE. No open wounds b/l LE. No interdigital macerations noted b/l LE. Toenails 3-5 bilaterally elongated, discolored, dystrophic, thickened, and crumbly with subungual debris and tenderness to dorsal palpation. Anonychia noted bilateral great toes and bilateral 2nd toes. Nailbed(s) epithelialized.  Porokeratotic lesion(s) sub 5th met base and plantar heel pad RLE. Hyperkeratotic lesions submet head 5 b/l.  No erythema, no edema, no drainage, no fluctuance.  Orthopedic: Muscle strength 5/5 to all lower extremity muscle groups bilaterally. Pes planus deformity noted bilateral LE. Utilizes cane for ambulation assistance.   Radiographs:  None  Assessment/Plan: 1. Pain due to onychomycosis of toenails of both feet   2. Porokeratosis   3. Callus   4. Pain in both feet      -Examined patient. -Medicaid ABN signed for services of paring of corn(s)/callus(es)/porokeratos(es) today. Copy in patient chart. -Mycotic toenails 3-5 bilaterally were debrided in length and girth with sterile nail nippers and dremel without iatrogenic bleeding. -Callus(es) submet head 5 b/l pared utilizing sterile scalpel blade without complication or incident. Total number debrided =2. -Porokeratotic lesion(s) plantar heel pad of right foot and sub 5th met base right lower extremity pared and enucleated with sterile scalpel blade without incident. Total number of lesions debrided=2. -Patient/POA to call should there be question/concern in the interim.   Return in about 2 months (around 04/03/2022).  Marzetta Board, DPM

## 2022-01-29 ENCOUNTER — Encounter: Payer: Medicaid Other | Attending: Physical Medicine & Rehabilitation | Admitting: Physical Medicine & Rehabilitation

## 2022-01-29 ENCOUNTER — Encounter: Payer: Self-pay | Admitting: Physical Medicine & Rehabilitation

## 2022-01-29 VITALS — BP 168/96 | HR 88 | Ht 65.5 in | Wt 222.0 lb

## 2022-01-29 DIAGNOSIS — G8114 Spastic hemiplegia affecting left nondominant side: Secondary | ICD-10-CM | POA: Insufficient documentation

## 2022-01-29 NOTE — Progress Notes (Signed)
Botox Injection for spasticity using needle EMG guidance  Dilution: 50 Units/ml Indication: Severe spasticity which interferes with ADL,mobility and/or  hygiene and is unresponsive to medication management and other conservative care Informed consent was obtained after describing risks and benefits of the procedure with the patient. This includes bleeding, bruising, infection, excessive weakness, or medication side effects. A REMS form is on file and signed. Needle: 25g 2" needle electrode Number of units per muscle  Pecs 100U Biceps 125 medial (short head)   FCR 50 FCU 50 FDP 25 FDS 25 PT 25  All injections were done after obtaining appropriate EMG activity and after negative drawback for blood. The patient tolerated the procedure well. Post procedure instructions were given. A followup appointment was made.

## 2022-01-29 NOTE — Patient Instructions (Signed)

## 2022-02-11 ENCOUNTER — Ambulatory Visit: Payer: Medicaid Other | Attending: Physical Medicine & Rehabilitation | Admitting: Occupational Therapy

## 2022-02-11 ENCOUNTER — Encounter: Payer: Self-pay | Admitting: Occupational Therapy

## 2022-02-11 DIAGNOSIS — R2689 Other abnormalities of gait and mobility: Secondary | ICD-10-CM | POA: Insufficient documentation

## 2022-02-11 DIAGNOSIS — R29898 Other symptoms and signs involving the musculoskeletal system: Secondary | ICD-10-CM | POA: Diagnosis present

## 2022-02-11 DIAGNOSIS — M6281 Muscle weakness (generalized): Secondary | ICD-10-CM | POA: Insufficient documentation

## 2022-02-11 DIAGNOSIS — M25642 Stiffness of left hand, not elsewhere classified: Secondary | ICD-10-CM | POA: Insufficient documentation

## 2022-02-11 DIAGNOSIS — I69354 Hemiplegia and hemiparesis following cerebral infarction affecting left non-dominant side: Secondary | ICD-10-CM | POA: Diagnosis present

## 2022-02-11 NOTE — Therapy (Signed)
OUTPATIENT OCCUPATIONAL THERAPY TREATMENT NOTE & RE-CERTIFICATION   Patient Name: [name] MRN: [id number] DOB:[birth year], 61 y.o., male Today's Date: 02/11/2022  PCP: [name], MD REFERRING PROVIDER: [name], MD  END OF SESSION:   OT End of Session - 02/11/22 0804     Visit Number 7    Number of Visits 15    Date for OT Re-Evaluation 04/12/22    Authorization Type [place] [employer]    Authorization - Visit Number 5    Authorization - Number of Visits 50    OT Start Time 0800    OT Stop Time 0840    OT Time Calculation (min) 40 min    Activity Tolerance Patient tolerated treatment well    Behavior During Therapy WFL for tasks assessed/performed            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   History of gout    History of kidney stones    Hyperlipidemia    Hypertension    Prostate cancer [facility]) urologist-- dr [name];  oncologist-  dr [name]   dx 07-20-2017   Renal calculi    Stroke ([place]) sept 26,  20006    hx mini strokes in past, 1 large stroke   Weakness of left side of body 03/2005   cva residual   Weakness of one side of body    L sided weakness after stroke   Past Surgical History:  Procedure Laterality Date   COLONOSCOPY WITH PROPOFOL N/A 09/27/2015   Procedure: COLONOSCOPY WITH PROPOFOL;  Surgeon: [name], MD;  [facility];  Service: Endoscopy;  Laterality: N/A;   CYSTOSCOPY N/A 10/05/2012   Procedure: CYSTOSCOPY, retrograde and right stent placement;  Surgeon: [name], MD;  [facility];  Service: Urology;  Laterality: N/A;   CYSTOSCOPY WITH RETROGRADE PYELOGRAM, URETEROSCOPY AND STENT  PLACEMENT Right 04/13/2014   Procedure: CYSTOSCOPY WITH RETROGRADE PYELOGRAM, URETEROSCOPY AND STENT PLACEMENT, LASER ABLATION OF SCAR TISSUE;  Surgeon: [name], MD;  [facility];  Service: Urology;  Laterality: Right;   EYE SURGERY Left age 10   HOLMIUM LASER APPLICATION Right 95/62/1308   Procedure: HOLMIUM LASER APPLICATION;  Surgeon: [name], MD;  [facility];  Service: Urology;  Laterality: Right;   NEPHROLITHOTOMY Right 10/05/2012   Procedure: NEPHROLITHOTOMY PERCUTANEOUS;  Surgeon: [name], MD;  [facility];  Service: Urology;  Laterality: Right;  WITH CYSTO AND SURGEON ACCESS    NEPHROLITHOTOMY Right 10/07/2012   Procedure: NEPHROLITHOTOMY PERCUTANEOUS SECOND LOOK. Stent exchange,Ureteroscopy ;  Surgeon: [name], MD;  [facility];  Service: Urology;  Laterality: Right;   RADIOACTIVE SEED IMPLANT N/A 11/12/2018   Procedure: RADIOACTIVE SEED IMPLANT/BRACHYTHERAPY IMPLANT;  Surgeon: [name], MD;  [facility];  Service: Urology;  Laterality: N/A;  [place] N/A 11/12/2018   Procedure: SPACE OAR INSTILLATION;  Surgeon: [name], MD;  [facility];  Service: Urology;  Laterality: N/A;   TOENAIL EXCISION     Patient Active Problem List   Diagnosis Date Noted   Spastic hemiplegia ([place]) 10/16/2021   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  Spastic hemiparesis of left nondominant side due to acute cerebral infarction ([place]) 04/03/2021   Malignant neoplasm of prostate ([place]) 08/19/2018    ONSET DATE: 11/27/2021 (date of OT order)   REFERRING DIAG: G81.10 (ICD-10-CM) - Spastic hemiplegia, unspecified etiology, unspecified laterality ([place])  THERAPY DIAG:  Hemiplegia and hemiparesis following cerebral infarction affecting left non-dominant side (HCC)  Muscle weakness (generalized)  Stiffness of left hand, not elsewhere classified  Other symptoms and signs involving the musculoskeletal system  Other abnormalities of  gait and mobility  Rationale for Evaluation and Treatment Habilitation  PERTINENT HISTORY: Chronic L spastic hemi s/p R basal ganglia hemorrhage in 2006. Additional PMH: HTN, HLD, blindness in L eye w/ low vision in R eye, COPD h/o prostate cancer, OA and gout   PRECAUTIONS: Fall  SUBJECTIVE:  SUBJECTIVE STATEMENT: Pt reports his last Botox injection was about 2 months before starting therapy, not on 11/27/21  PAIN:  Are you having pain? No  PATIENT GOALS: "Raise up my (L) arm;" "try to get it to help me hold something"  OBJECTIVE:   UPPER EXTREMITY ROM      Active ROM Left Eval - 6/1 Left 7/10 Left 8/14  Shoulder flexion 29 47 63  Shoulder abduction 21 36 45  Shoulder adduction 0    Shoulder extension 28 44   Shoulder IR      Shoulder ER    34  Elbow flexion 95 121 120  Elbow extension 45 34 36  Wrist flexion 0 25 48  Wrist extension 26 55 63  Wrist ulnar deviation      Wrist radial deviation      Wrist pronation      Wrist supination   22 34  (Blank rows = not tested)     Passive ROM Left Eval - 6/1 Left 7/10  Shoulder flexion 32 78  Shoulder abduction     Shoulder adduction     Shoulder extension     Shoulder IR     Shoulder ER     Elbow flexion 135 144  Elbow extension 16 0  Wrist flexion 58 63  Wrist extension 53 54  Wrist ulnar deviation     Wrist radial deviation     Wrist pronation     Wrist supination   58  (Blank rows = not tested)    TODAY'S TREATMENT:   02/11/22 PROM OT performed gentle PROM of L shoulder ER, elbow extension, and shoulder flexion w/ pt in supine, incorporating passive stretch held at end range. Completed x10 slowly in each plane of movement; able to achieve ROM [facility] w/out discomfort in all planes  AAROM Closed-chain AAROM of shoulder forward chest press, shoulder flexion, and shoulder ER w/ 1# dowel; completed 2x10 of each in supine w/ OT providing verbal cues for alignment and positioning and tactile/visual cue prn,  particularly for elbow extension w/ shoulder flexion and ER arc of motion  Rolling large therapy ball forward w/ BUEs while sitting EOM, focusing on shoulder flexion AAROM; completed 2x15 w/ good technique  Shoulder flexion w/ hands clasped while sitting EOM; completed 2x10 w/ OT providing demonstration, education, and verbal cues to push "out and up" vs finishing the movement w/ elbow flexion    PATIENT EDUCATION: Ongoing condition-specific education related to therapeutic interventions completed this session Person educated: Patient Education method: Explanation Education comprehension: verbalized understanding     HOME EXERCISE PROGRAM: MedBridge Code: [id number] - Supine Shoulder Flexion AAROM with Hands Clasped  - 2 x daily -  2 sets - 10 reps - Supine Shoulder Press AAROM in Abduction with Dowel  - 2 x daily - 2 sets - 10 reps - Supine Shoulder Flexion AAROM with Dowel  - 2 x daily - 2 sets - 10 reps - Supine Shoulder Abduction AAROM with Dowel  - 2 x daily - 2 sets - 10 reps - Forearm Supination Stretch  - 2 x daily - 2 sets - 5 reps - 5-10 hold     GOALS: Goals reviewed with patient? Yes   SHORT TERM GOALS: Target date: 12/29/21   STG   Status:  1 Pt will demonstrate independence w/ HEP designed for self-stretching and ROM of LUE Baseline: Not currently completing HEP Met - 12/31/21      LONG TERM GOALS: Target date: 01/28/22   LTG   Status:  1 Pt will improve L shoulder flexion to at least 70 degrees of AROM to improve functional use of LUE during BADLs Baseline: 29* AROM (32* PROM) Progressing  2 Pt will demonstrate use of LUE as gross assist during simple dressing, cleaning, or meal prep task Baseline: decreased functional use of LUE Progressing  3 Pt will be independent w/ wear and care of orthosis or other positional considerations for management of L hand spasticity Baseline: has not worn resting hand splint in years Progressing      ASSESSMENT:   CLINICAL  IMPRESSION: Pt returns to OP OT after prior session 01/07/22 and botox injection to LUE w/ Dr. Letta Pate on 01/29/22. OT reassessed AROM this session w/ pt demonstrating improved shoulder flexion, external rotation, wrist flex/ext, and forearm supination. PROM assessed in supine w/ OT focusing on shoulder flexion, ER, and elbow extension. PROM followed by [facility] w/ dowel progressing to AAROM against gravity w/ pt demonstrating good technique. Pt will benefit from resuming skilled occupational therapy services for an additional 6 weeks to maximize benefit of botox injections to LUE and further address ROM, GMC and coordination, compensatory strategies/AE prn, and progression of pt-specific HEP to improve participation and safety during ADLs and maximize functional use of LUE at non-dominant level.   PERFORMANCE DEFICITS in functional skills including ADLs, IADLs, coordination, dexterity, proprioception, sensation, tone, ROM, strength, muscle spasms, FMC, GMC, mobility, balance, and UE functional use.   IMPAIRMENTS are limiting patient from ADLs and IADLs.    COMORBIDITIES: has co-morbidities such as blindness in L eye  that affects occupational performance. Patient will benefit from skilled OT to address above impairments and improve overall function.     PLAN: OT FREQUENCY: 1x/week   OT DURATION: 6 weeks   PLANNED INTERVENTIONS: self care/ADL training, therapeutic exercise, therapeutic activity, neuromuscular re-education, manual therapy, passive range of motion, functional mobility training, splinting, electrical stimulation, fluidotherapy, moist heat, cryotherapy, patient/family education, and DME and/or AE instructions   RECOMMENDED OTHER SERVICES: None   CONSULTED AND AGREED WITH PLAN OF CARE: Patient   PLAN FOR NEXT SESSION: Continue w/ LUE ROM; anti-spasticity orthosis for L hand?   Kathrine Cords, MSOT, OTR/L  02/11/2022, 8:51 AM

## 2022-02-18 ENCOUNTER — Encounter: Payer: Self-pay | Admitting: Occupational Therapy

## 2022-02-18 ENCOUNTER — Ambulatory Visit: Payer: Medicaid Other | Admitting: Occupational Therapy

## 2022-02-18 DIAGNOSIS — R2689 Other abnormalities of gait and mobility: Secondary | ICD-10-CM

## 2022-02-18 DIAGNOSIS — R29898 Other symptoms and signs involving the musculoskeletal system: Secondary | ICD-10-CM

## 2022-02-18 DIAGNOSIS — M6281 Muscle weakness (generalized): Secondary | ICD-10-CM

## 2022-02-18 DIAGNOSIS — I69354 Hemiplegia and hemiparesis following cerebral infarction affecting left non-dominant side: Secondary | ICD-10-CM | POA: Diagnosis not present

## 2022-02-18 DIAGNOSIS — M25642 Stiffness of left hand, not elsewhere classified: Secondary | ICD-10-CM

## 2022-02-18 NOTE — Therapy (Unsigned)
OUTPATIENT OCCUPATIONAL THERAPY TREATMENT NOTE   Patient Name: [name] MRN: [id number] DOB:1960-08-11, 61 y.o., male Today's Date: 02/18/2022  PCP: [name], MD REFERRING PROVIDER: [name], MD  END OF SESSION:   OT End of Session - 02/18/22 0805     Visit Number 8    Number of Visits 15    Date for OT Re-Evaluation 04/12/22    Authorization Type [place] Medicaid    Authorization - Visit Number 6    Authorization - Number of Visits 34    OT Start Time 0802    OT Stop Time 0842    OT Time Calculation (min) 40 min    Activity Tolerance Patient tolerated treatment well    Behavior During Therapy WFL for tasks assessed/performed            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   History of gout    History of kidney stones    Hyperlipidemia    Hypertension    Prostate cancer [facility]) urologist-- dr [name];  oncologist-  dr [name]   dx 07-20-2017   Renal calculi    Stroke ([place]) sept 26,  20006    hx mini strokes in past, 1 large stroke   Weakness of left side of body 03/2005   cva residual   Weakness of one side of body    L sided weakness after stroke   Past Surgical History:  Procedure Laterality Date   COLONOSCOPY WITH PROPOFOL N/A 09/27/2015   Procedure: COLONOSCOPY WITH PROPOFOL;  Surgeon: [name], MD;  [facility];  Service: Endoscopy;  Laterality: N/A;   CYSTOSCOPY N/A 10/05/2012   Procedure: CYSTOSCOPY, retrograde and right stent placement;  Surgeon: [name], MD;  [facility];  Service: Urology;  Laterality: N/A;   CYSTOSCOPY WITH RETROGRADE PYELOGRAM, URETEROSCOPY AND STENT PLACEMENT Right  04/13/2014   Procedure: CYSTOSCOPY WITH RETROGRADE PYELOGRAM, URETEROSCOPY AND STENT PLACEMENT, LASER ABLATION OF SCAR TISSUE;  Surgeon: [name], MD;  [facility];  Service: Urology;  Laterality: Right;   EYE SURGERY Left age 58   HOLMIUM LASER APPLICATION Right 88/87/5797   Procedure: HOLMIUM LASER APPLICATION;  Surgeon: [name], MD;  [facility];  Service: Urology;  Laterality: Right;   NEPHROLITHOTOMY Right 10/05/2012   Procedure: NEPHROLITHOTOMY PERCUTANEOUS;  Surgeon: [name], MD;  [facility];  Service: Urology;  Laterality: Right;  WITH CYSTO AND SURGEON ACCESS    NEPHROLITHOTOMY Right 10/07/2012   Procedure: NEPHROLITHOTOMY PERCUTANEOUS SECOND LOOK. Stent exchange,Ureteroscopy ;  Surgeon: [name], MD;  [facility];  Service: Urology;  Laterality: Right;   RADIOACTIVE SEED IMPLANT N/A 11/12/2018   Procedure: RADIOACTIVE SEED IMPLANT/BRACHYTHERAPY IMPLANT;  Surgeon: [name], MD;  [facility];  Service: Urology;  Laterality: N/A;  [place] N/A 11/12/2018   Procedure: SPACE OAR INSTILLATION;  Surgeon: [name], MD;  [facility];  Service: Urology;  Laterality: N/A;   TOENAIL EXCISION     Patient Active Problem List   Diagnosis Date Noted   Spastic hemiplegia ([place]) 10/16/2021   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   Spastic hemiparesis  of left nondominant side due to acute cerebral infarction ([place]) 04/03/2021   Malignant neoplasm of prostate ([place]) 08/19/2018    ONSET DATE: 11/27/2021 (date of OT order)   REFERRING DIAG: G81.10 (ICD-10-CM) - Spastic hemiplegia, unspecified etiology, unspecified laterality ([place])  THERAPY DIAG:  Hemiplegia and hemiparesis following cerebral infarction affecting left non-dominant side (HCC)  Muscle weakness (generalized)  Stiffness of left hand, not elsewhere classified  Other symptoms and signs involving the musculoskeletal system  Other abnormalities of gait and  mobility  Rationale for Evaluation and Treatment Habilitation  PERTINENT HISTORY: Chronic L spastic hemi s/p R basal ganglia hemorrhage in 2006. Additional PMH: HTN, HLD, blindness in L eye w/ low vision in R eye, COPD h/o prostate cancer, OA and gout   PRECAUTIONS: Fall  SUBJECTIVE:  SUBJECTIVE STATEMENT: "I don't know if I did too much with my exercises or slept on it wrong;" pt reports his arm has felt more "tight and heavy" since Saturday  PAIN:  Are you having pain? No  PATIENT GOALS: "Raise up my (L) arm;" "try to get it to help me hold something"  OBJECTIVE:   UPPER EXTREMITY ROM      Active ROM Left Eval - 6/1 Left 7/10 Left 8/14  Shoulder flexion 29 47 63  Shoulder abduction 21 36 45  Shoulder adduction 0    Shoulder extension 28 44   Shoulder IR      Shoulder ER    34  Elbow flexion 95 121 120  Elbow extension 45 34 36  Wrist flexion 0 25 48  Wrist extension 26 55 63  Wrist ulnar deviation      Wrist radial deviation      Wrist pronation      Wrist supination   22 34  (Blank rows = not tested)     Passive ROM Left Eval - 6/1 Left 7/10  Shoulder flexion 32 78  Shoulder abduction     Shoulder adduction     Shoulder extension     Shoulder IR     Shoulder ER     Elbow flexion 135 144  Elbow extension 16 0  Wrist flexion 58 63  Wrist extension 53 54  Wrist ulnar deviation     Wrist radial deviation     Wrist pronation     Wrist supination   58  (Blank rows = not tested)    TODAY'S TREATMENT:   02/18/22 Reviewed HEP Shoulder shrugs and rolls Isometric shoulder flexion, ER, and extension 2x10 each Pillowcase slides (flexion and ER) Positional recommendations  PROM OT performed gentle PROM of L shoulder ER, elbow extension, and shoulder flexion w/ pt in supine, incorporating passive stretch held at end range. Completed x10 slowly in each plane of movement; able to achieve ROM [facility] w/out discomfort in all planes  AAROM Closed-chain AAROM of  shoulder forward chest press, shoulder flexion, and shoulder ER w/ 1# dowel; completed 2x10 of each in supine w/ OT providing verbal cues for alignment and positioning and tactile/visual cue prn, particularly for elbow extension w/ shoulder flexion and ER arc of motion  Rolling large therapy ball forward w/ BUEs while sitting EOM, focusing on shoulder flexion AAROM; completed 2x15 w/ good technique  Shoulder flexion w/ hands clasped while sitting EOM; completed 2x10 w/ OT providing demonstration, education, and verbal cues to push "out and up" vs finishing the movement w/ elbow flexion    PATIENT EDUCATION: Ongoing condition-specific education related to therapeutic interventions completed this session Person  educated: Patient Education method: Explanation Education comprehension: verbalized understanding     HOME EXERCISE PROGRAM: MedBridge Code: [id number] - Seated Single Arm Shoulder Shrug  - 3 x daily - 2 sets - 15 reps - Seated Shoulder Rolls  - 3 x daily - 2 sets - 15 reps - Seated Bent Over Shoulder Row with Dumbbells  - 3 x daily - 2 sets - 15 reps - Seated Elbow Flexion and Extension AROM  - 3 x daily - 2 sets - 15 reps - Supine Shoulder Press AAROM in Abduction with Dowel  - 2 x daily - 2 sets - 10 reps     GOALS: Goals reviewed with patient? Yes   SHORT TERM GOALS: Target date: 12/29/21   STG   Status:  1 Pt will demonstrate independence w/ HEP designed for self-stretching and ROM of LUE Baseline: Not currently completing HEP Met - 12/31/21      LONG TERM GOALS: Target date: 01/28/22   LTG   Status:  1 Pt will improve L shoulder flexion to at least 70 degrees of AROM to improve functional use of LUE during BADLs Baseline: 29* AROM (32* PROM) Progressing  2 Pt will demonstrate use of LUE as gross assist during simple dressing, cleaning, or meal prep task Baseline: decreased functional use of LUE Progressing  3 Pt will be independent w/ wear and care of orthosis or other  positional considerations for management of L hand spasticity Baseline: has not worn resting hand splint in years Progressing      ASSESSMENT:   CLINICAL IMPRESSION: Pt returns to OP OT after prior session 01/07/22 and botox injection to LUE w/ Dr. Letta Pate on 01/29/22. OT reassessed AROM this session w/ pt demonstrating improved shoulder flexion, external rotation, wrist flex/ext, and forearm supination. PROM assessed in supine w/ OT focusing on shoulder flexion, ER, and elbow extension. PROM followed by [facility] w/ dowel progressing to AAROM against gravity w/ pt demonstrating good technique. Pt will benefit from resuming skilled occupational therapy services for an additional 6 weeks to maximize benefit of botox injections to LUE and further address ROM, GMC and coordination, compensatory strategies/AE prn, and progression of pt-specific HEP to improve participation and safety during ADLs and maximize functional use of LUE at non-dominant level.   PERFORMANCE DEFICITS in functional skills including ADLs, IADLs, coordination, dexterity, proprioception, sensation, tone, ROM, strength, muscle spasms, FMC, GMC, mobility, balance, and UE functional use.   IMPAIRMENTS are limiting patient from ADLs and IADLs.    COMORBIDITIES: has co-morbidities such as blindness in L eye  that affects occupational performance. Patient will benefit from skilled OT to address above impairments and improve overall function.     PLAN: OT FREQUENCY: 1x/week   OT DURATION: 6 weeks   PLANNED INTERVENTIONS: self care/ADL training, therapeutic exercise, therapeutic activity, neuromuscular re-education, manual therapy, passive range of motion, functional mobility training, splinting, electrical stimulation, fluidotherapy, moist heat, cryotherapy, patient/family education, and DME and/or AE instructions   RECOMMENDED OTHER SERVICES: None   CONSULTED AND AGREED WITH PLAN OF CARE: Patient   PLAN FOR NEXT SESSION: Continue w/  LUE ROM; anti-spasticity orthosis for L hand?   Kathrine Cords, MSOT, OTR/L  02/18/2022, 8:47 AM

## 2022-02-25 ENCOUNTER — Ambulatory Visit: Payer: Medicaid Other | Admitting: Occupational Therapy

## 2022-03-11 ENCOUNTER — Encounter: Payer: Medicaid Other | Admitting: Occupational Therapy

## 2022-03-18 ENCOUNTER — Encounter: Payer: Medicaid Other | Admitting: Occupational Therapy

## 2022-03-19 ENCOUNTER — Encounter: Payer: Self-pay | Admitting: Physical Medicine & Rehabilitation

## 2022-03-19 ENCOUNTER — Encounter: Payer: Medicaid Other | Attending: Physical Medicine & Rehabilitation | Admitting: Physical Medicine & Rehabilitation

## 2022-03-19 VITALS — BP 89/64 | HR 109 | Ht 65.5 in | Wt 202.0 lb

## 2022-03-19 DIAGNOSIS — G8110 Spastic hemiplegia affecting unspecified side: Secondary | ICD-10-CM | POA: Diagnosis present

## 2022-03-19 NOTE — Progress Notes (Signed)
Subjective:    Patient ID: [name], male    DOB: 1961-01-11, 61 y.o.   MRN: [id number] [facility] 2006 with chronic left HP and spasticity HPI  Fingers are getting tighter , shoulder feels ok.  Getting OP OT 01/29/22 BOTOX  Pecs 100U Biceps 125 medial (short head)   FCR 50 FCU 50 FDP 25 FDS 25 PT 25 Pain Inventory Average Pain 0 Pain Right Now 0 My pain is  no pain  In the last 24 hours, has pain interfered with the following? General activity 0 Relation with others 0 Enjoyment of life 0 What TIME of day is your pain at its worst? no pain Sleep (in general) Good  Pain is worse with: no pain Pain improves with: no pain Relief from Meds: no pain  Family History  Problem Relation Age of Onset   Brain cancer Maternal Grandfather    Social History   Socioeconomic History   Marital status: Single    Spouse name: Not on file   Number of children: 0   Years of education: Not on file   Highest education level: Not on file  Occupational History    Comment: disability  Tobacco Use   Smoking status: Every Day    Packs/day: 0.50    Years: 15.00    Total pack years: 7.50    Types: Cigarettes   Smokeless tobacco: Never  Vaping Use   Vaping Use: Never used  Substance and Sexual Activity   Alcohol use: No   Drug use: No   Sexual activity: Not on file  Other Topics Concern   Not on file  Social History Narrative   Not on file   Social Determinants of Health   Financial Resource Strain: Not on file  Food Insecurity: Not on file  Transportation Needs: No Transportation Needs (11/26/2018)   PRAPARE - Hydrologist (Medical): No    Lack of Transportation (Non-Medical): No  Physical Activity: Not on file  Stress: Not on file  Social Connections: Not on file   Past Surgical History:  Procedure Laterality Date   COLONOSCOPY WITH PROPOFOL N/A 09/27/2015   Procedure: COLONOSCOPY WITH PROPOFOL;  Surgeon: [name], MD;  [facility];  Service: Endoscopy;  Laterality: N/A;   CYSTOSCOPY N/A 10/05/2012   Procedure: CYSTOSCOPY, retrograde and right stent placement;  Surgeon: [name], MD;  [facility];  Service: Urology;  Laterality: N/A;   CYSTOSCOPY WITH RETROGRADE PYELOGRAM, URETEROSCOPY AND STENT PLACEMENT Right 04/13/2014   Procedure: CYSTOSCOPY WITH RETROGRADE PYELOGRAM, URETEROSCOPY AND STENT PLACEMENT, LASER ABLATION OF SCAR TISSUE;  Surgeon: [name], MD;  [facility];  Service: Urology;  Laterality: Right;   EYE SURGERY Left age 8   HOLMIUM LASER APPLICATION Right 54/49/2010   Procedure: HOLMIUM LASER APPLICATION;  Surgeon: [name], MD;  [facility];  Service: Urology;  Laterality: Right;   NEPHROLITHOTOMY Right 10/05/2012   Procedure: NEPHROLITHOTOMY PERCUTANEOUS;  Surgeon: [name], MD;  [facility];  Service: Urology;  Laterality: Right;  WITH CYSTO AND SURGEON ACCESS    NEPHROLITHOTOMY Right 10/07/2012   Procedure: NEPHROLITHOTOMY PERCUTANEOUS SECOND LOOK. Stent exchange,Ureteroscopy ;  Surgeon: [name], MD;  [facility];  Service: Urology;  Laterality: Right;   RADIOACTIVE SEED IMPLANT N/A 11/12/2018   Procedure: RADIOACTIVE SEED IMPLANT/BRACHYTHERAPY IMPLANT;  Surgeon: [name], MD;  [facility];  Service: Urology;  Laterality: N/A;  [place]  N/A 11/12/2018   Procedure: SPACE OAR INSTILLATION;  Surgeon: [name], MD;  [facility];  Service: Urology;  Laterality: N/A;   TOENAIL EXCISION     Past Surgical History:  Procedure Laterality Date   COLONOSCOPY WITH PROPOFOL N/A 09/27/2015   Procedure: COLONOSCOPY WITH PROPOFOL;  Surgeon: [name], MD;  [facility];  Service: Endoscopy;  Laterality: N/A;   CYSTOSCOPY N/A 10/05/2012   Procedure: CYSTOSCOPY, retrograde and right stent placement;  Surgeon: [name], MD;  [facility];  Service: Urology;  Laterality: N/A;   CYSTOSCOPY WITH  RETROGRADE PYELOGRAM, URETEROSCOPY AND STENT PLACEMENT Right 04/13/2014   Procedure: CYSTOSCOPY WITH RETROGRADE PYELOGRAM, URETEROSCOPY AND STENT PLACEMENT, LASER ABLATION OF SCAR TISSUE;  Surgeon: [name], MD;  [facility];  Service: Urology;  Laterality: Right;   EYE SURGERY Left age 64   HOLMIUM LASER APPLICATION Right 79/89/2119   Procedure: HOLMIUM LASER APPLICATION;  Surgeon: [name], MD;  [facility];  Service: Urology;  Laterality: Right;   NEPHROLITHOTOMY Right 10/05/2012   Procedure: NEPHROLITHOTOMY PERCUTANEOUS;  Surgeon: [name], MD;  [facility];  Service: Urology;  Laterality: Right;  WITH CYSTO AND SURGEON ACCESS    NEPHROLITHOTOMY Right 10/07/2012   Procedure: NEPHROLITHOTOMY PERCUTANEOUS SECOND LOOK. 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   History of gout    History of kidney stones    Hyperlipidemia    Hypertension    Prostate cancer [facility]) urologist-- dr [name];  oncologist-  dr [name]   dx 07-20-2017   Renal calculi    Stroke ([place]) sept 26,  20006    hx mini strokes in past, 1 large stroke   Weakness of left side of body  03/2005   cva residual   Weakness of one side of body    L sided weakness after stroke   BP (!) 89/64   Pulse (!) 109   Ht 5' 5.5" (1.664 m)   Wt 202 lb (91.6 kg)   SpO2 96%   BMI 33.10 kg/m   Opioid Risk Score:   Fall Risk Score:  `1  Depression screen [facility] 2/9     01/29/2022    9:51 AM 11/27/2021   12:02 PM 10/16/2021   11:25 AM 09/04/2021   10:44 AM 07/24/2021   10:47 AM 05/31/2021    9:45 AM 01/23/2021   10:21 AM  Depression screen PHQ 2/9  Decreased Interest 0 0 0 0 0 0 0  Down, Depressed, Hopeless 0 0 0 0 0 0 0  PHQ - 2 Score 0 0 0 0 0 0 0      Review of  Systems  Neurological:        Tightness in left hand  All other systems reviewed and are negative.     Objective:   Physical Exam   MAS 3 at Left biceps and Finger flexors MAS 1 at pec MAS 2 at wrist flexors MAS 1 at thumb flexors  MMT Left Upper 3- delt, bi, tri O/5 finger flexors and ext (severe tone)     Assessment & Plan:   Left spastic hemiplegia post stroke , good reductions of pectoralis tone but still with mod/sev spasticity Left finger flexors, will reduce pectoralis dose and increase FDS and FDP dose based on exam today  BOTOX  Pecs 50U Biceps 125 medial (short head)   FCR 50 FCU 50 FDP 50 FDS 50 PT 25

## 2022-03-19 NOTE — Patient Instructions (Signed)
Will increase dose to finger flexors next injection

## 2022-04-08 ENCOUNTER — Ambulatory Visit: Payer: Medicaid Other | Admitting: Podiatry

## 2022-04-08 ENCOUNTER — Encounter: Payer: Self-pay | Admitting: Podiatry

## 2022-04-08 DIAGNOSIS — M79675 Pain in left toe(s): Secondary | ICD-10-CM | POA: Diagnosis not present

## 2022-04-08 DIAGNOSIS — M79672 Pain in left foot: Secondary | ICD-10-CM | POA: Diagnosis not present

## 2022-04-08 DIAGNOSIS — M79674 Pain in right toe(s): Secondary | ICD-10-CM

## 2022-04-08 DIAGNOSIS — M79671 Pain in right foot: Secondary | ICD-10-CM

## 2022-04-08 DIAGNOSIS — L84 Corns and callosities: Secondary | ICD-10-CM | POA: Diagnosis not present

## 2022-04-08 DIAGNOSIS — B351 Tinea unguium: Secondary | ICD-10-CM | POA: Diagnosis not present

## 2022-04-08 DIAGNOSIS — Q828 Other specified congenital malformations of skin: Secondary | ICD-10-CM

## 2022-04-08 NOTE — Progress Notes (Signed)
  Subjective:  Patient ID: Genia Del, male    DOB: 07-30-1960,  MRN: [id number]  Genia Del presents to clinic today for:  Chief Complaint  Patient presents with   Nail Problem    Routine foot care PCP-[place] PCP VST-2 weeks ago   New problem(s): None.   No Known Allergies  Review of Systems: Negative except as noted in the HPI.  Objective:  BAYLEN BUCKNER is a pleasant 61 y.o. male obese in NAD. AAO x 3. Vascular Capillary refill time to digits immediate b/l. Palpable pedal pulses b/l LE. Pedal hair sparse. No pain with calf compression b/l. Trace edema noted BLE. No cyanosis or clubbing noted b/l LE.  Neurologic Normal speech. Oriented to person, place, and time. Protective sensation intact 5/5 intact bilaterally with 10g monofilament b/l.  Dermatologic Pedal integument with normal turgor, texture and tone BLE. No open wounds b/l LE. No interdigital macerations noted b/l LE. Toenails 3-5 bilaterally elongated, discolored, dystrophic, thickened, and crumbly with subungual debris and tenderness to dorsal palpation.   Anonychia noted bilateral great toes and bilateral 2nd toes. Nailbed(s) epithelialized.    Porokeratotic lesion(s) sub 5th met base right foot. Hyperkeratotic lesions submet head 5 b/l and medial IPJ right hallux.  No erythema, no edema, no drainage, no fluctuance.  Orthopedic: Muscle strength 5/5 to all lower extremity muscle groups bilaterally. Pes planus deformity noted bilateral LE. Utilizes cane for ambulation assistance.   Radiographs: None  Assessment/Plan: 1. Pain due to onychomycosis of toenails of both feet   2. Porokeratosis   3. Callus   4. Pain in both feet     No orders of the defined types were placed in this encounter.    -Consent given for treatment as described below: -Examined patient. -Medicaid ABN signed for services of paring of corn(s)/callus(es)/porokeratos(es) today. Copy in patient chart. -Mycotic toenails 3-5 bilaterally  were debrided in length and girth with sterile nail nippers and dremel without iatrogenic bleeding. -Callus(es) medial IPJ of right great toe and submet head 5 b/l pared utilizing sterile scalpel blade without complication or incident. Total number debrided =3. -Porokeratotic lesion(s) sub 5th met base right lower extremity pared and enucleated with sterile currette without incident. Total number of lesions debrided=1. -Patient/POA to call should there be question/concern in the interim.   Return in about 9 weeks (around 06/10/2022).  Marzetta Board, DPM

## 2022-04-12 ENCOUNTER — Telehealth: Payer: Self-pay

## 2022-04-12 NOTE — Telephone Encounter (Signed)
Entered in error

## 2022-04-22 NOTE — Therapy (Signed)
[place] [place], [place], [address] Phone: [phone]   Fax:  [phone]  April 22, 2022    Occupational Therapy Discharge Summary   Patient: [name] MRN: [id number] Date of Birth: August 04, 1960   Visits from [facility]: 8  The treatment consisted of self care/ADL training, therapeutic exercise, therapeutic activity, neuromuscular re-education, manual therapy, passive range of motion, functional mobility training, patient education, and DME and/or AE instructions  Functional Status at Discharge:  Patient arrived to session on 02/25/22, reporting that he did not feel well and wanted to follow-up on some things medically before returning to OP OT. Pt did not fully achieve goals as all remaining visits were cancelled.  Remaining deficits: As of last visit on 02/25/22 - decreased LUE control, strength, and ROM, limitations w/ ADLs and IADLs, decreased sensation, deficits w/ endurance and functional mobility.   Education / Equipment: Condition-specific education; HEP   Goals Partially Met - pt had met 1/1 STG and 0/3 LTGs at time of last visit. Remaining goals were not met as they were unable to be assessed due to unexpected d/c.    Kathrine Cords, OTR/L, MSOT  04/22/22, 5:35 PM

## 2022-04-30 ENCOUNTER — Encounter: Payer: Medicaid Other | Attending: Physical Medicine & Rehabilitation | Admitting: Physical Medicine & Rehabilitation

## 2022-04-30 ENCOUNTER — Encounter: Payer: Self-pay | Admitting: Physical Medicine & Rehabilitation

## 2022-04-30 VITALS — BP 119/82 | HR 95 | Ht 65.5 in | Wt 200.0 lb

## 2022-04-30 DIAGNOSIS — G8114 Spastic hemiplegia affecting left nondominant side: Secondary | ICD-10-CM | POA: Diagnosis present

## 2022-04-30 DIAGNOSIS — G8110 Spastic hemiplegia affecting unspecified side: Secondary | ICD-10-CM | POA: Insufficient documentation

## 2022-04-30 DIAGNOSIS — I639 Cerebral infarction, unspecified: Secondary | ICD-10-CM | POA: Insufficient documentation

## 2022-04-30 MED ORDER — SODIUM CHLORIDE (PF) 0.9 % IJ SOLN
8.0000 mL | Freq: Once | INTRAMUSCULAR | Status: AC
  Administered 2022-04-30: 8 mL via INTRAVENOUS

## 2022-04-30 MED ORDER — ONABOTULINUMTOXINA 100 UNITS IJ SOLR
400.0000 [IU] | Freq: Once | INTRAMUSCULAR | Status: AC
  Administered 2022-04-30: 400 [IU] via INTRAMUSCULAR

## 2022-04-30 NOTE — Patient Instructions (Signed)

## 2022-04-30 NOTE — Progress Notes (Signed)
.  Botox Injection for spasticity using needle EMG guidance  Dilution: 50 Units/ml Indication: Severe spasticity which interferes with ADL,mobility and/or  hygiene and is unresponsive to medication management and other conservative care Informed consent was obtained after describing risks and benefits of the procedure with the patient. This includes bleeding, bruising, infection, excessive weakness, or medication side effects. A REMS form is on file and signed. Needle: 27g 1" needle electrode Number of units per muscle Pecs 50U Biceps 75 medial (short head)  Brachialis 50 FCR 50 FCU 50 FDP 50 FDS 50 PT 25 All injections were done after obtaining appropriate EMG activity and after negative drawback for blood. The patient tolerated the procedure well. Post procedure instructions were given. A followup appointment was made.

## 2022-06-10 ENCOUNTER — Ambulatory Visit: Payer: Medicaid Other | Admitting: Podiatry

## 2022-06-10 VITALS — BP 146/85

## 2022-06-10 DIAGNOSIS — M79675 Pain in left toe(s): Secondary | ICD-10-CM | POA: Diagnosis not present

## 2022-06-10 DIAGNOSIS — Q828 Other specified congenital malformations of skin: Secondary | ICD-10-CM

## 2022-06-10 DIAGNOSIS — R7303 Prediabetes: Secondary | ICD-10-CM

## 2022-06-10 DIAGNOSIS — M79674 Pain in right toe(s): Secondary | ICD-10-CM

## 2022-06-10 DIAGNOSIS — L84 Corns and callosities: Secondary | ICD-10-CM

## 2022-06-10 DIAGNOSIS — B351 Tinea unguium: Secondary | ICD-10-CM | POA: Diagnosis not present

## 2022-06-10 NOTE — Progress Notes (Signed)
  Subjective:  Patient ID: [name], male    DOB: 11/05/1960,  MRN: [id number]  TKAI SERFASS presents to clinic today for {jgcomplaint:23593} No chief complaint on file.  New problem(s): None. {jgcomplaint:23593}  PCP is [name], MD.  No Known Allergies  Review of Systems: Negative except as noted in the HPI.  Objective: No changes noted in today's physical examination. There were no vitals filed for this visit.  [name] is a pleasant 61 y.o. male obese in NAD. AAO x 3.  Vascular Capillary refill time to digits immediate b/l. Palpable pedal pulses b/l LE. Pedal hair sparse. No pain with calf compression b/l. Trace edema noted BLE. No cyanosis or clubbing noted b/l LE.  Neurologic Normal speech. Oriented to person, place, and time. Protective sensation intact 5/5 intact bilaterally with 10g monofilament b/l.  Dermatologic Pedal integument with normal turgor, texture and tone BLE. No open wounds b/l LE. No interdigital macerations noted b/l LE. Toenails 3-5 bilaterally elongated, discolored, dystrophic, thickened, and crumbly with subungual debris and tenderness to dorsal palpation.   Anonychia noted bilateral great toes and bilateral 2nd toes. Nailbed(s) epithelialized.    Porokeratotic lesion(s) sub 5th met base right foot. Hyperkeratotic lesions submet head 5 b/l and medial IPJ right hallux.  No erythema, no edema, no drainage, no fluctuance.  Orthopedic: Muscle strength 5/5 to all lower extremity muscle groups bilaterally. Pes planus deformity noted bilateral LE. Utilizes cane for ambulation assistance.   Radiographs: None Assessment/Plan: 1. Pain due to onychomycosis of toenails of both feet   2. Porokeratosis   3. Callus   4. Prediabetes     No orders of the defined types were placed in this encounter.   {Jgplan:23602::"-Patient/POA to call should there be question/concern in the interim."}   No follow-ups on file.  Marzetta Board, DPM

## 2022-06-12 ENCOUNTER — Encounter: Payer: Self-pay | Admitting: Podiatry

## 2022-06-18 ENCOUNTER — Encounter: Payer: Medicaid Other | Attending: Physical Medicine & Rehabilitation | Admitting: Physical Medicine & Rehabilitation

## 2022-06-18 ENCOUNTER — Encounter: Payer: Self-pay | Admitting: Physical Medicine & Rehabilitation

## 2022-06-18 VITALS — BP 133/87 | HR 101 | Ht 65.5 in | Wt 200.0 lb

## 2022-06-18 DIAGNOSIS — G8112 Spastic hemiplegia affecting left dominant side: Secondary | ICD-10-CM | POA: Diagnosis present

## 2022-06-18 NOTE — Progress Notes (Signed)
Subjective:    Patient ID: Genia Del, male    DOB: [birth year], 61 y.o.   MRN: [id number]  HPI 61 year old male who suffered a right basal ganglia hemorrhage in 2006 and has had severe persistent left spastic hemiplegia affecting the upper extremity greater than lower extremity. Remains on Baclofen '20mg'$  TID and tizanidine '4mg'$  BID prescribed by PCP Left arm more relaxed after Botox able to extend , helps pt with bed mobility 04/30/22  Pecs 50U Biceps 75 medial (short head)  Brachialis 50 FCR 50 FCU 50 FDP 50 FDS 50 PT 25 Pain Inventory Average Pain 0 Pain Right Now 0 My pain is  na  In the last 24 hours, has pain interfered with the following? General activity 0 Relation with others 0 Enjoyment of life 0 What TIME of day is your pain at its worst? na Sleep (in general) Good  Pain is worse with:  na Pain improves with:  na Relief from Meds:  na  Family History  Problem Relation Age of Onset   Brain cancer Maternal Grandfather    Social History   Socioeconomic History   Marital status: Single    Spouse name: Not on file   Number of children: 0   Years of education: Not on file   Highest education level: Not on file  Occupational History    Comment: disability  Tobacco Use   Smoking status: Every Day    Packs/day: 0.50    Years: 15.00    Total pack years: 7.50    Types: Cigarettes   Smokeless tobacco: Never  Vaping Use   Vaping Use: Never used  Substance and Sexual Activity   Alcohol use: No   Drug use: No   Sexual activity: Not on file  Other Topics Concern   Not on file  Social History Narrative   Not on file   Social Determinants of Health   Financial Resource Strain: Not on file  Food Insecurity: Not on file  Transportation Needs: No Transportation Needs (11/26/2018)   PRAPARE - Hydrologist (Medical): No    Lack of Transportation (Non-Medical): No  Physical Activity: Not on file  Stress: Not on file  Social  Connections: Not on file   Past Surgical History:  Procedure Laterality Date   COLONOSCOPY WITH PROPOFOL N/A 09/27/2015   Procedure: COLONOSCOPY WITH PROPOFOL;  Surgeon: [name], MD;  [facility];  Service: Endoscopy;  Laterality: N/A;   CYSTOSCOPY N/A 10/05/2012   Procedure: CYSTOSCOPY, retrograde and right stent placement;  Surgeon: [name], MD;  [facility];  Service: Urology;  Laterality: N/A;   CYSTOSCOPY WITH RETROGRADE PYELOGRAM, URETEROSCOPY AND STENT PLACEMENT Right 04/13/2014   Procedure: CYSTOSCOPY WITH RETROGRADE PYELOGRAM, URETEROSCOPY AND STENT PLACEMENT, LASER ABLATION OF SCAR TISSUE;  Surgeon: [name], MD;  [facility];  Service: Urology;  Laterality: Right;   EYE SURGERY Left age 43   HOLMIUM LASER APPLICATION Right 61/95/0932   Procedure: HOLMIUM LASER APPLICATION;  Surgeon: [name], MD;  [facility];  Service: Urology;  Laterality: Right;   NEPHROLITHOTOMY Right 10/05/2012   Procedure: NEPHROLITHOTOMY PERCUTANEOUS;  Surgeon: [name], MD;  [facility];  Service: Urology;  Laterality: Right;  WITH CYSTO AND SURGEON ACCESS    NEPHROLITHOTOMY Right 10/07/2012   Procedure: NEPHROLITHOTOMY PERCUTANEOUS SECOND LOOK. Stent exchange,Ureteroscopy ;  Surgeon: [name], MD;  [facility];  Service: Urology;  Laterality: Right;   RADIOACTIVE SEED IMPLANT N/A 11/12/2018  Procedure: RADIOACTIVE SEED IMPLANT/BRACHYTHERAPY IMPLANT;  Surgeon: [name], MD;  [facility];  Service: Urology;  Laterality: N/A;  [place] N/A 11/12/2018   Procedure: SPACE OAR INSTILLATION;  Surgeon: [name], MD;  [facility];  Service: Urology;  Laterality: N/A;   TOENAIL EXCISION     Past Surgical History:  Procedure Laterality Date   COLONOSCOPY WITH PROPOFOL N/A 09/27/2015   Procedure: COLONOSCOPY WITH PROPOFOL;  Surgeon: [name], MD;  [facility];  Service: Endoscopy;   Laterality: N/A;   CYSTOSCOPY N/A 10/05/2012   Procedure: CYSTOSCOPY, retrograde and right stent placement;  Surgeon: [name], MD;  [facility];  Service: Urology;  Laterality: N/A;   CYSTOSCOPY WITH RETROGRADE PYELOGRAM, URETEROSCOPY AND STENT PLACEMENT Right 04/13/2014   Procedure: CYSTOSCOPY WITH RETROGRADE PYELOGRAM, URETEROSCOPY AND STENT PLACEMENT, LASER ABLATION OF SCAR TISSUE;  Surgeon: [name], MD;  [facility];  Service: Urology;  Laterality: Right;   EYE SURGERY Left age 14   HOLMIUM LASER APPLICATION Right 63/78/5885   Procedure: HOLMIUM LASER APPLICATION;  Surgeon: [name], MD;  [facility];  Service: Urology;  Laterality: Right;   NEPHROLITHOTOMY Right 10/05/2012   Procedure: NEPHROLITHOTOMY PERCUTANEOUS;  Surgeon: [name], MD;  [facility];  Service: Urology;  Laterality: Right;  WITH CYSTO AND SURGEON ACCESS    NEPHROLITHOTOMY Right 10/07/2012   Procedure: NEPHROLITHOTOMY PERCUTANEOUS SECOND LOOK. 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   History of gout    History of kidney stones    Hyperlipidemia    Hypertension     Prostate cancer [facility]) urologist-- dr [name];  oncologist-  dr [name]   dx 07-20-2017   Renal calculi    Stroke ([place]) sept 26,  20006    hx mini strokes in past, 1 large stroke   Weakness of left side of body 03/2005   cva residual   Weakness of one side of body    L sided weakness after stroke   BP 133/87   Pulse (!) 101   Ht 5' 5.5" (1.664 m)   Wt 200 lb (90.7 kg)   SpO2 97%   BMI 32.78 kg/m   Opioid Risk Score:   Fall Risk Score:  `1  Depression screen [facility] 2/9     01/29/2022    9:51 AM 11/27/2021   12:02 PM 10/16/2021   11:25 AM 09/04/2021   10:44 AM 07/24/2021   10:47 AM 05/31/2021    9:45 AM 01/23/2021   10:21 AM  Depression screen PHQ 2/9  Decreased Interest 0 0 0 0 0 0 0  Down, Depressed,  Hopeless 0 0 0 0 0 0 0  PHQ - 2 Score 0 0 0 0 0 0 0    Review of Systems  Neurological:  Positive for weakness.  All other systems reviewed and are negative.     Objective:   Physical Exam General no acute distress Mood and affect are appropriate No evidence of aphasia or dysarthria Alert and oriented x 3 Ambulates without assistive device no evidence of toe drag or knee instability but has reduced velocity of gait and reduced stride length. Left upper limb 3 - strength at the deltoid bicep tricep finger flexors and extensors. Musculoskeletal has contracture at digits 345 left hand at the PIPs.  No contracture at the DIP or MCP.  No contracture at the wrist or elbow. Tone MAS 2 at the elbow flexors MAS 2 at the pectoralis MAS 2 at the wrist flexors and MAS 2 at the finger flexors with MAS 0 at the thumb flexor.        Assessment & Plan:   #1.  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  He does get functional improvements in terms of bed mobility after botulinum toxin injection left upper extremity. Will continue with current dosing. We discussed the finger contractures really do not expect any improvement with further Botox on PIP  flexion digits 345.  Do not think that off-the-shelf bracing would be able to accommodate the 90 degree contracture at the PIPs at digits 345 left hand I will see him back in approximately 6 weeks for the injection. Repeat Botox 400U  LUE Pecs 50U Biceps 75 medial (short head)  Brachialis 50 FCR 50 FCU 50 FDP 50 FDS 50 PT 25

## 2022-06-18 NOTE — Patient Instructions (Signed)
Hold off on hand brace

## 2022-07-10 ENCOUNTER — Other Ambulatory Visit: Payer: Self-pay | Admitting: Internal Medicine

## 2022-07-11 LAB — COMPLETE METABOLIC PANEL WITH GFR
AG Ratio: 1.3 (calc) (ref 1.0–2.5)
ALT: 28 U/L (ref 9–46)
AST: 15 U/L (ref 10–35)
Albumin: 4.3 g/dL (ref 3.6–5.1)
Alkaline phosphatase (APISO): 61 U/L (ref 35–144)
BUN: 15 mg/dL (ref 7–25)
CO2: 27 mmol/L (ref 20–32)
Calcium: 9.7 mg/dL (ref 8.6–10.3)
Chloride: 100 mmol/L (ref 98–110)
Creat: 0.96 mg/dL (ref 0.70–1.35)
Globulin: 3.3 g/dL (calc) (ref 1.9–3.7)
Glucose, Bld: 68 mg/dL (ref 65–99)
Potassium: 3.8 mmol/L (ref 3.5–5.3)
Sodium: 142 mmol/L (ref 135–146)
Total Bilirubin: 0.6 mg/dL (ref 0.2–1.2)
Total Protein: 7.6 g/dL (ref 6.1–8.1)
eGFR: 89 mL/min/{1.73_m2} (ref >=60)

## 2022-07-11 LAB — CBC
HCT: 50.6 % — ABNORMAL HIGH (ref 38.5–50.0)
Hemoglobin: 17.4 g/dL — ABNORMAL HIGH (ref 13.2–17.1)
MCH: 32.4 pg (ref 27.0–33.0)
MCHC: 34.4 g/dL (ref 32.0–36.0)
MCV: 94.2 fL (ref 80.0–100.0)
MPV: 11.1 fL (ref 7.5–12.5)
Platelets: 222 10*3/uL (ref 140–400)
RBC: 5.37 10*6/uL (ref 4.20–5.80)
RDW: 14.9 % (ref 11.0–15.0)
WBC: 8.4 10*3/uL (ref 3.8–10.8)

## 2022-07-11 LAB — LIPID PANEL
Cholesterol: 186 mg/dL (ref <200)
HDL: 73 mg/dL (ref >=40)
LDL Cholesterol (Calc): 96 mg/dL (calc)
Non-HDL Cholesterol (Calc): 113 mg/dL (calc) (ref <130)
Total CHOL/HDL Ratio: 2.5 (calc) (ref <5.0)
Triglycerides: 77 mg/dL (ref <150)

## 2022-07-11 LAB — VITAMIN D 25 HYDROXY (VIT D DEFICIENCY, FRACTURES): Vit D, 25-Hydroxy: 88 ng/mL (ref 30–100)

## 2022-07-11 LAB — TSH: TSH: 1.82 mIU/L (ref 0.40–4.50)

## 2022-08-02 ENCOUNTER — Encounter: Payer: Self-pay | Admitting: Physical Medicine & Rehabilitation

## 2022-08-02 ENCOUNTER — Encounter: Payer: Medicaid Other | Attending: Physical Medicine & Rehabilitation | Admitting: Physical Medicine & Rehabilitation

## 2022-08-02 VITALS — BP 128/84 | HR 89 | Temp 97.7°F | Ht 65.5 in | Wt 221.0 lb

## 2022-08-02 DIAGNOSIS — I639 Cerebral infarction, unspecified: Secondary | ICD-10-CM

## 2022-08-02 DIAGNOSIS — G8114 Spastic hemiplegia affecting left nondominant side: Secondary | ICD-10-CM | POA: Diagnosis not present

## 2022-08-02 MED ORDER — ONABOTULINUMTOXINA 100 UNITS IJ SOLR
400.0000 [IU] | Freq: Once | INTRAMUSCULAR | Status: AC
  Administered 2022-08-02: 400 [IU] via INTRAMUSCULAR

## 2022-08-02 NOTE — Progress Notes (Signed)
Botox Injection for spasticity using needle EMG guidance  Dilution: 50 Units/ml Indication: Severe spasticity which interferes with ADL,mobility and/or  hygiene and is unresponsive to medication management and other conservative care Informed consent was obtained after describing risks and benefits of the procedure with the patient. This includes bleeding, bruising, infection, excessive weakness, or medication side effects. A REMS form is on file and signed. Needle: 27g 1" needle electrode for pecs, 25g 2" for remaining muscles Number of units per muscle LUE Pecs 50U Biceps 75 medial (short head)  Brachialis 50 FCR 50 FCU 50 FDP 50 FDS 50 PT 25 All injections were done after obtaining appropriate EMG activity and after negative drawback for blood. The patient tolerated the procedure well. Post procedure instructions were given. A followup appointment was made.    Would consider following modification,  FCU 0 FPL 50

## 2022-08-02 NOTE — Patient Instructions (Addendum)

## 2022-08-06 ENCOUNTER — Ambulatory Visit: Payer: Medicaid Other | Admitting: Physical Medicine & Rehabilitation

## 2022-08-16 ENCOUNTER — Ambulatory Visit: Payer: Medicaid Other | Admitting: Podiatry

## 2022-08-16 ENCOUNTER — Encounter: Payer: Self-pay | Admitting: Podiatry

## 2022-08-16 VITALS — BP 124/73

## 2022-08-16 DIAGNOSIS — L84 Corns and callosities: Secondary | ICD-10-CM

## 2022-08-16 DIAGNOSIS — M79675 Pain in left toe(s): Secondary | ICD-10-CM | POA: Diagnosis not present

## 2022-08-16 DIAGNOSIS — M79674 Pain in right toe(s): Secondary | ICD-10-CM

## 2022-08-16 DIAGNOSIS — B351 Tinea unguium: Secondary | ICD-10-CM

## 2022-08-16 DIAGNOSIS — R7303 Prediabetes: Secondary | ICD-10-CM | POA: Diagnosis not present

## 2022-08-16 DIAGNOSIS — Q828 Other specified congenital malformations of skin: Secondary | ICD-10-CM | POA: Diagnosis not present

## 2022-08-16 NOTE — Progress Notes (Signed)
  Subjective:  Patient ID: [name], male    DOB: [birth year],  MRN: [id number]  [name] presents to clinic today for painful porokeratotic lesion(s) b/l lower extremities and painful mycotic toenails that limit ambulation. Painful toenails interfere with ambulation. Aggravating factors include wearing enclosed shoe gear. Pain is relieved with periodic professional debridement. Painful porokeratotic lesions are aggravated when weightbearing with and without shoegear. Pain is relieved with periodic professional debridement.  Chief Complaint  Patient presents with   Nail Problem    RFC PCP-[facility] PCP VST-07/2022   New problem(s): None.   PCP is [name], MD.  No Known Allergies  Review of Systems: Negative except as noted in the HPI.  Objective: No changes noted in today's physical examination. Vitals:   08/16/22 0738  BP: 124/73   [name] is a pleasant 62 y.o. male morbidly obese in NAD. AAO x 3. Vascular Capillary refill time to digits immediate b/l. Palpable pedal pulses b/l LE. Pedal hair sparse. No pain with calf compression b/l. Trace edema noted BLE. No cyanosis or clubbing noted b/l LE.  Neurologic Normal speech. Oriented to person, place, and time. Protective sensation intact 5/5 intact bilaterally with 10g monofilament b/l.  Dermatologic Pedal integument with normal turgor, texture and tone BLE. No open wounds b/l LE. No interdigital macerations noted b/l LE. Toenails 3-5 bilaterally elongated, discolored, dystrophic, thickened, and crumbly with subungual debris and tenderness to dorsal palpation.   Anonychia noted bilateral great toes and bilateral 2nd toes. Nailbed(s) epithelialized.    Porokeratotic lesion(s) sub 5th met base right foot. Hyperkeratotic lesions submet head 5 b/l and medial IPJ right hallux.  No erythema, no edema, no drainage, no fluctuance.  Orthopedic: Muscle strength 5/5 to all lower extremity muscle groups bilaterally. Pes  planus deformity noted bilateral LE. Utilizes cane for ambulation assistance.   Radiographs: None  Assessment/Plan: 1. Pain due to onychomycosis of toenails of both feet   2. Porokeratosis   3. Callus   4. Prediabetes   -Consent given for treatment as described below: -Examined patient. -Medicaid ABN on file for paring of corn(s)/callus(es)/porokeratos(es). Copy in patient chart. -Mycotic toenails 3-5 left foot and 3-5 right foot were debrided in length and girth with sterile nail nippers and dremel without iatrogenic bleeding. -Callus(es) right great toe, submet head 5 left foot, and submet head 5 right foot pared utilizing sterile scalpel blade without complication or incident. Total number debrided =3. -Porokeratotic lesion(s) sub 5th met base right lower extremity pared and enucleated with sterile currette without incident. Total number of lesions debrided=1. -Patient/POA to call should there be question/concern in the interim.   Return in about 9 weeks (around 10/18/2022).  Marzetta Board, DPM

## 2022-10-18 ENCOUNTER — Ambulatory Visit: Payer: Medicaid Other | Admitting: Podiatry

## 2022-10-18 ENCOUNTER — Encounter: Payer: Self-pay | Admitting: Podiatry

## 2022-10-18 VITALS — BP 145/83

## 2022-10-18 DIAGNOSIS — M2141 Flat foot [pes planus] (acquired), right foot: Secondary | ICD-10-CM

## 2022-10-18 DIAGNOSIS — M2142 Flat foot [pes planus] (acquired), left foot: Secondary | ICD-10-CM | POA: Diagnosis not present

## 2022-10-18 DIAGNOSIS — B351 Tinea unguium: Secondary | ICD-10-CM

## 2022-10-18 DIAGNOSIS — M79675 Pain in left toe(s): Secondary | ICD-10-CM | POA: Diagnosis not present

## 2022-10-18 DIAGNOSIS — M79674 Pain in right toe(s): Secondary | ICD-10-CM

## 2022-10-18 DIAGNOSIS — Q828 Other specified congenital malformations of skin: Secondary | ICD-10-CM

## 2022-10-18 DIAGNOSIS — L84 Corns and callosities: Secondary | ICD-10-CM

## 2022-10-18 DIAGNOSIS — E119 Type 2 diabetes mellitus without complications: Secondary | ICD-10-CM | POA: Diagnosis not present

## 2022-10-18 NOTE — Progress Notes (Unsigned)
  Subjective:  Patient ID: [name], male    DOB: 03/22/1961,  MRN: [id number]  [name] presents to clinic today for {jgcomplaint:23593}  Chief Complaint  Patient presents with   Nail Problem    RFC PCP-[facility] PCP VST-09/2022   New problem(s): None. {jgcomplaint:23593}  PCP is Fleet Contras, MD.  No Known Allergies  Review of Systems: Negative except as noted in the HPI.  Objective: No changes noted in today's physical examination. Vitals:   10/18/22 0746  BP: (!) 145/83   [name] is a pleasant 62 y.o. male {jgbodyhabitus:24098} AAO x 3.  Vascular Capillary refill time to digits immediate b/l. Palpable pedal pulses b/l LE. Pedal hair sparse. No pain with calf compression b/l. Trace edema noted BLE. No cyanosis or clubbing noted b/l LE.  Neurologic Normal speech. Oriented to person, place, and time. Protective sensation intact 5/5 intact bilaterally with 10g monofilament b/l.  Dermatologic Pedal integument with normal turgor, texture and tone BLE. No open wounds b/l LE. No interdigital macerations noted b/l LE. Toenails 3-5 bilaterally elongated, discolored, dystrophic, thickened, and crumbly with subungual debris and tenderness to dorsal palpation.   Anonychia noted bilateral great toes and bilateral 2nd toes. Nailbed(s) epithelialized.    Porokeratotic lesion(s) sub 5th met base right foot. Hyperkeratotic lesions submet head 5 b/l and medial IPJ right hallux.  No erythema, no edema, no drainage, no fluctuance.  Orthopedic: Muscle strength 5/5 to all lower extremity muscle groups bilaterally. Pes planus deformity noted bilateral LE. Utilizes cane for ambulation assistance.   Radiographs: None  Assessment/Plan: 1. Pain due to onychomycosis of toenails of both feet   2. Porokeratosis   3. Callus   4. Pes planus of both feet     No orders of the defined types were placed in this encounter.   None {Jgplan:23602::"-Patient/POA to call should there be  question/concern in the interim."}   Return in about 3 months (around 01/17/2023).  Freddie Breech, DPM

## 2022-11-01 ENCOUNTER — Ambulatory Visit: Payer: Medicaid Other | Admitting: Physical Medicine & Rehabilitation

## 2022-11-12 ENCOUNTER — Encounter: Payer: Self-pay | Admitting: Physical Medicine & Rehabilitation

## 2022-11-12 ENCOUNTER — Encounter: Payer: Medicaid Other | Attending: Physical Medicine & Rehabilitation | Admitting: Physical Medicine & Rehabilitation

## 2022-11-12 VITALS — BP 143/86 | HR 96 | Ht 65.5 in | Wt 213.0 lb

## 2022-11-12 DIAGNOSIS — G8112 Spastic hemiplegia affecting left dominant side: Secondary | ICD-10-CM | POA: Diagnosis not present

## 2022-11-12 MED ORDER — ONABOTULINUMTOXINA 100 UNITS IJ SOLR
400.0000 [IU] | Freq: Once | INTRAMUSCULAR | Status: AC
  Administered 2022-11-12: 400 [IU] via INTRAMUSCULAR

## 2022-11-12 MED ORDER — SODIUM CHLORIDE (PF) 0.9 % IJ SOLN
8.0000 mL | Freq: Once | INTRAMUSCULAR | Status: AC
  Administered 2022-11-12: 8 mL via INTRAVENOUS

## 2022-11-12 NOTE — Patient Instructions (Signed)

## 2022-11-12 NOTE — Progress Notes (Signed)
Botox Injection for spasticity using needle EMG guidance  Dilution: 50 Units/ml Indication: Severe spasticity which interferes with ADL,mobility and/or  hygiene and is unresponsive to medication management and other conservative care Informed consent was obtained after describing risks and benefits of the procedure with the patient. This includes bleeding, bruising, infection, excessive weakness, or medication side effects. A REMS form is on file and signed. Needle: 27g 1" needle electrode for pecs, 25g 2" for remaining muscles Number of units per muscle LUE Pecs 25U x 2 clavicular head more active  Biceps 75 medial x2(short head), long head x 1 (Long head with more EMG activity   Brachialis 50 FCR 50  FDP 75 FDS 75 PT 25 All injections were done after obtaining appropriate EMG activity and after negative drawback for blood. The patient tolerated the procedure well. Post procedure instructions were given. A followup appointment was made.   Consider increased dose to Biceps short head  and Pec clavicular head

## 2022-11-26 ENCOUNTER — Other Ambulatory Visit (HOSPITAL_COMMUNITY): Payer: Self-pay

## 2022-11-26 MED ORDER — TRULICITY 1.5 MG/0.5ML ~~LOC~~ SOAJ
1.5000 mg | SUBCUTANEOUS | 2 refills | Status: DC
  Filled 2022-11-26: qty 2, 28d supply, fill #0

## 2022-11-27 ENCOUNTER — Other Ambulatory Visit (HOSPITAL_COMMUNITY): Payer: Self-pay

## 2022-11-28 ENCOUNTER — Other Ambulatory Visit (HOSPITAL_COMMUNITY): Payer: Self-pay

## 2022-11-28 DIAGNOSIS — R0982 Postnasal drip: Secondary | ICD-10-CM | POA: Insufficient documentation

## 2022-12-20 ENCOUNTER — Ambulatory Visit: Payer: Medicaid Other | Admitting: Podiatry

## 2023-01-14 ENCOUNTER — Encounter: Payer: Self-pay | Admitting: Podiatry

## 2023-01-14 ENCOUNTER — Ambulatory Visit: Payer: Medicaid Other | Admitting: Podiatry

## 2023-01-14 VITALS — BP 150/87

## 2023-01-14 DIAGNOSIS — Q828 Other specified congenital malformations of skin: Secondary | ICD-10-CM | POA: Diagnosis not present

## 2023-01-14 DIAGNOSIS — L84 Corns and callosities: Secondary | ICD-10-CM | POA: Diagnosis not present

## 2023-01-14 DIAGNOSIS — E119 Type 2 diabetes mellitus without complications: Secondary | ICD-10-CM | POA: Diagnosis not present

## 2023-01-14 DIAGNOSIS — M79675 Pain in left toe(s): Secondary | ICD-10-CM

## 2023-01-14 DIAGNOSIS — B351 Tinea unguium: Secondary | ICD-10-CM

## 2023-01-14 DIAGNOSIS — M79674 Pain in right toe(s): Secondary | ICD-10-CM

## 2023-01-19 NOTE — Progress Notes (Signed)
  Subjective:  Patient ID: [name], male    DOB: [birth year],  MRN: [id number]  [name] presents to clinic today for at risk foot care. Patient has history of diabetes and painful porokeratotic lesion(s) both feet and painful mycotic toenails that limit ambulation. Painful toenails interfere with ambulation. Aggravating factors include wearing enclosed shoe gear. Pain is relieved with periodic professional debridement. Painful porokeratotic lesions are aggravated when weightbearing with and without shoegear. Pain is relieved with periodic professional debridement.  Chief Complaint  Patient presents with   Nail Problem    rfc   New problem(s): None.   PCP is Fleet Contras, MD.  No Known Allergies  Review of Systems: Negative except as noted in the HPI.  Objective: No changes noted in today's physical examination. Vitals:   01/14/23 0902  BP: (!) 150/87   [name] is a pleasant 62 y.o. male obese in NAD. AAO x 3.  Vascular Examination: Capillary refill time immediate b/l. Vascular status intact b/l with palpable pedal pulses. Pedal hair absent b/l. No pain with calf compression b/l. Skin temperature gradient WNL b/l. No cyanosis or clubbing b/l. No ischemia or gangrene noted b/l. Trace edema noted BLE.  Neurological Examination: Sensation grossly intact b/l with 10 gram monofilament.  Dermatological Examination: Pedal skin with normal turgor, texture and tone b/l.  No open wounds. No interdigital macerations.   Toenails 2-5 b/l thick, discolored, elongated with subungual debris and pain on dorsal palpation.   Porokeratotic lesion(s) L hallux, R hallux, submet head 5 b/l, and sub 5th met base right foot. No erythema, no edema, no drainage, no fluctuance.  Musculoskeletal Examination: Normal muscle strength 5/5 to all lower extremity muscle groups bilaterally. No pain, crepitus or joint limitation noted with ROM b/l LE. Pes planus deformity noted bilateral LE.[name]  Patient ambulates with cane assistance.  Radiographs: None  Assessment/Plan: 1. Pain due to onychomycosis of toenails of both feet   2. Porokeratosis   3. Callus   4. Type 2 diabetes mellitus without complication, without long-term current use of insulin (HCC)     -Consent given for treatment as described below: -Examined patient. -Patient to continue soft, supportive shoe gear daily. -Toenails were debrided in length and girth 3-5 bilaterally with sterile nail nippers and dremel without iatrogenic bleeding.  -Porokeratotic lesion(s) R hallux, submet head 5 b/l, and sub 5th met base right foot pared and enucleated with sterile currette without incident. Total number of lesions debrided=4. -Patient/POA to call should there be question/concern in the interim.   Return in about 9 weeks (around 03/18/2023).  Freddie Breech, DPM

## 2023-02-13 ENCOUNTER — Ambulatory Visit: Payer: Medicaid Other | Admitting: Physical Medicine & Rehabilitation

## 2023-02-18 ENCOUNTER — Encounter: Payer: Self-pay | Admitting: Physical Medicine & Rehabilitation

## 2023-02-18 ENCOUNTER — Encounter: Payer: Medicaid Other | Attending: Physical Medicine & Rehabilitation | Admitting: Physical Medicine & Rehabilitation

## 2023-02-18 VITALS — BP 158/95 | HR 83 | Ht 65.5 in | Wt 199.0 lb

## 2023-02-18 DIAGNOSIS — G8114 Spastic hemiplegia affecting left nondominant side: Secondary | ICD-10-CM | POA: Diagnosis present

## 2023-02-18 DIAGNOSIS — I639 Cerebral infarction, unspecified: Secondary | ICD-10-CM | POA: Insufficient documentation

## 2023-02-18 MED ORDER — ONABOTULINUMTOXINA 100 UNITS IJ SOLR
400.0000 [IU] | Freq: Once | INTRAMUSCULAR | Status: AC
  Administered 2023-02-18: 400 [IU] via INTRAMUSCULAR

## 2023-02-18 NOTE — Patient Instructions (Signed)

## 2023-02-18 NOTE — Progress Notes (Signed)
Botox Injection for spasticity using needle EMG guidance  Dilution: 50 Units/ml Indication: Severe spasticity which interferes with ADL,mobility and/or  hygiene and is unresponsive to medication management and other conservative care Informed consent was obtained after describing risks and benefits of the procedure with the patient. This includes bleeding, bruising, infection, excessive weakness, or medication side effects. A REMS form is on file and signed. Needle: 27g 1" needle electrode for pecs, 25g 2" for remaining muscles Number of units per muscle LUE Pecs 25U x 2 clavicular head more active  Biceps 75 medial x2(short head), long head x 1  Brachialis 50 FCR 50  FDP 75 FDS 75 PT 25 All injections were done after obtaining appropriate EMG activity and after negative drawback for blood. The patient tolerated the procedure well. Post procedure instructions were given. A followup appointment was made.

## 2023-03-25 ENCOUNTER — Ambulatory Visit: Payer: Medicaid Other | Admitting: Podiatry

## 2023-03-25 ENCOUNTER — Encounter: Payer: Self-pay | Admitting: Podiatry

## 2023-03-25 VITALS — BP 131/83 | HR 94

## 2023-03-25 DIAGNOSIS — Q828 Other specified congenital malformations of skin: Secondary | ICD-10-CM

## 2023-03-25 DIAGNOSIS — M79674 Pain in right toe(s): Secondary | ICD-10-CM | POA: Diagnosis not present

## 2023-03-25 DIAGNOSIS — E119 Type 2 diabetes mellitus without complications: Secondary | ICD-10-CM | POA: Diagnosis not present

## 2023-03-25 DIAGNOSIS — B351 Tinea unguium: Secondary | ICD-10-CM | POA: Diagnosis not present

## 2023-03-25 DIAGNOSIS — M79675 Pain in left toe(s): Secondary | ICD-10-CM

## 2023-03-25 NOTE — Progress Notes (Signed)
Subjective:  Patient ID: [name], male    DOB: 1961/05/23,  MRN: [id number]  AITOR [place]URN presents to clinic today for at risk foot care. Pt has h/o NIDDM with PAD and painful porokeratotic lesion(s) right foot and painful mycotic toenails that limit ambulation. Painful toenails interfere with ambulation. Aggravating factors include wearing enclosed shoe gear. Pain is relieved with periodic professional debridement. Painful porokeratotic lesions are aggravated when weightbearing with and without shoegear. Pain is relieved with periodic professional debridement.  Chief Complaint  Patient presents with   Diabetes    DFC BS - DK A1C - DK     Callouses    BILAT   New problem(s): None.   PCP is Fleet Contras, MD.  No Known Allergies  Review of Systems: Negative except as noted in the HPI.  Objective: No changes noted in today's physical examination. Vitals:   03/25/23 0808  BP: 131/83  Pulse: 94  SpO2: 96%   NUR [name] is a pleasant 62 y.o. male obese in NAD. AAO x 3.  Vascular Examination: Capillary refill time immediate b/l. Vascular status intact b/l with palpable pedal pulses. Pedal hair absent b/l. No pain with calf compression b/l. Skin temperature gradient WNL b/l. No cyanosis or clubbing b/l. No ischemia or gangrene noted b/l. Trace edema noted BLE.  Neurological Examination: Sensation grossly intact b/l with 10 gram monofilament.  Dermatological Examination: Pedal skin with normal turgor, texture and tone b/l.  No open wounds. No interdigital macerations.   Toenails 2-5 b/l thick, discolored, elongated with subungual debris and pain on dorsal palpation.   Porokeratotic lesion(s) L hallux, R hallux, submet head 5 b/l, and sub 5th met base right foot. No erythema, no edema, no drainage, no fluctuance.  Musculoskeletal Examination: Normal muscle strength 5/5 to all lower extremity muscle groups bilaterally. No pain, crepitus or joint limitation noted  with ROM b/l LE. Pes planus deformity noted bilateral LE.[name] Patient ambulates with cane assistance.  Radiographs: None  Assessment/Plan: 1. Pain due to onychomycosis of toenails of both feet   2. Porokeratosis   3. Type 2 diabetes mellitus without complication, without long-term current use of insulin (HCC)     -Consent given for treatment as described below: -Examined patient. -Continue supportive shoe gear daily. -Toenails 3-5 bilaterally debrided in length and girth without iatrogenic bleeding with sterile nail nipper and dremel.  -Porokeratoses L hallux, R hallux, submet head 5 b/l, and sub 5th met base right foot pared utilizing sterile scalpel blade without complication or incident. Total number debrided =5. -Patient/POA to call should there be question/concern in the interim.   Return in about 2 months (around 06/04/2023).  [name], DPM

## 2023-05-27 ENCOUNTER — Encounter: Payer: Medicaid Other | Admitting: Physical Medicine & Rehabilitation

## 2023-06-04 ENCOUNTER — Ambulatory Visit (INDEPENDENT_AMBULATORY_CARE_PROVIDER_SITE_OTHER): Payer: Medicaid Other | Admitting: Podiatry

## 2023-06-04 ENCOUNTER — Encounter: Payer: Self-pay | Admitting: Podiatry

## 2023-06-04 VITALS — Ht 65.5 in | Wt 199.0 lb

## 2023-06-04 DIAGNOSIS — Q828 Other specified congenital malformations of skin: Secondary | ICD-10-CM | POA: Diagnosis not present

## 2023-06-04 DIAGNOSIS — M79675 Pain in left toe(s): Secondary | ICD-10-CM

## 2023-06-04 DIAGNOSIS — E1151 Type 2 diabetes mellitus with diabetic peripheral angiopathy without gangrene: Secondary | ICD-10-CM

## 2023-06-04 DIAGNOSIS — M79674 Pain in right toe(s): Secondary | ICD-10-CM | POA: Diagnosis not present

## 2023-06-04 DIAGNOSIS — B351 Tinea unguium: Secondary | ICD-10-CM | POA: Diagnosis not present

## 2023-06-04 NOTE — Progress Notes (Signed)
  Subjective:  Patient ID: [name], male    DOB: 09/19/1960,  MRN: [id number]  62 y.o. male presents at risk foot care. Pt has h/o NIDDM with PAD and painful porokeratotic lesion(s) b/l feet and painful mycotic toenails that limit ambulation. Painful toenails interfere with ambulation. Aggravating factors include wearing enclosed shoe gear. Pain is relieved with periodic professional debridement. Painful porokeratotic lesions are aggravated when weightbearing with and without shoegear. Pain is relieved with periodic professional debridement. Chief Complaint  Patient presents with   Nail Problem    Pt is here for [facility], unsure of his last A1C, PCP is Dr Concepcion Elk and LOV he is unsure of it.   New problem(s): None   PCP is Fleet Contras, MD   No Known Allergies  Review of Systems: Negative except as noted in the HPI.   Objective:  [name] is a pleasant 62 y.o. male obese in NAD. AAO x 3.  Vascular Examination: Capillary refill time immediate b/l. Vascular status intact b/l with palpable pedal pulses. Pedal hair absent b/l. No pain with calf compression b/l. Skin temperature gradient WNL b/l. No cyanosis or clubbing b/l. No ischemia or gangrene noted b/l. Trace edema noted BLE.  Neurological Examination: Sensation grossly intact b/l with 10 gram monofilament.  Dermatological Examination: Pedal skin with normal turgor, texture and tone b/l.  No open wounds. No interdigital macerations.   Toenails 2-5 b/l thick, discolored, elongated with subungual debris and pain on dorsal palpation.   Porokeratotic lesion(s) L hallux, R hallux, submet head 5 b/l, and sub 5th met base right foot. No erythema, no edema, no drainage, no fluctuance.  Musculoskeletal Examination: Normal muscle strength 5/5 to all lower extremity muscle groups bilaterally. No pain, crepitus or joint limitation noted with ROM b/l LE. Pes planus deformity noted bilateral LE.[name] Patient ambulates with cane  assistance.  Radiographs: None  Last A1c:       No data to display         Assessment:   1. Pain due to onychomycosis of toenails of both feet   2. Porokeratosis   3. Type II diabetes mellitus with peripheral circulatory disorder [facility])    Plan:  -Patient was evaluated today. All questions/concerns addressed on today's visit. -Medicaid ABN signed for services of paring of corn(s)/callus(es)/porokeratos(es) today. Copy in patient chart. -Continue foot and shoe inspections daily. Monitor blood glucose per PCP/Endocrinologist's recommendations. -Patient to continue soft, supportive shoe gear daily. -Mycotic toenails 3-5 left foot and 3-5 right foot were debrided in length and girth with sterile nail nippers and dremel without iatrogenic bleeding. -Porokeratotic lesion(s) right great toe, submet head 5 b/l, and sub 5th met base right foot pared and enucleated with sterile currette without incident. Total number of lesions debrided=4. -Patient/POA to call should there be question/concern in the interim.  Return in about 9 weeks (around 08/06/2023).  Freddie Breech, DPM      [place] LOCATION: [address], [place] [address]                   Office [phone]   [facility] LOCATION: [address] [place], [place] [address] Office [phone]

## 2023-07-03 ENCOUNTER — Encounter: Payer: Medicaid Other | Attending: Physical Medicine & Rehabilitation | Admitting: Physical Medicine & Rehabilitation

## 2023-07-03 ENCOUNTER — Encounter: Payer: Self-pay | Admitting: Physical Medicine & Rehabilitation

## 2023-07-03 VITALS — BP 148/89 | HR 2 | Ht 65.5 in | Wt 188.8 lb

## 2023-07-03 DIAGNOSIS — I639 Cerebral infarction, unspecified: Secondary | ICD-10-CM | POA: Insufficient documentation

## 2023-07-03 DIAGNOSIS — G8114 Spastic hemiplegia affecting left nondominant side: Secondary | ICD-10-CM | POA: Insufficient documentation

## 2023-07-03 MED ORDER — SODIUM CHLORIDE (PF) 0.9 % IJ SOLN
8.0000 mL | Freq: Once | INTRAMUSCULAR | Status: AC
  Administered 2023-07-03: 8 mL

## 2023-07-03 MED ORDER — ONABOTULINUMTOXINA 100 UNITS IJ SOLR
400.0000 [IU] | Freq: Once | INTRAMUSCULAR | Status: AC
  Administered 2023-07-03: 400 [IU] via INTRAMUSCULAR

## 2023-07-03 NOTE — Patient Instructions (Addendum)
 You received a Botox  injection today. You may experience soreness at the needle injection sites. Please call us  if any of the injection sites turns red after a couple days or if there is any drainage. You may experience muscle weakness as a result of Botox . This would improve with time but can take several weeks to improve. The Botox  should start working in about one week. The Botox  usually last 3 months. The injection can be repeated every 3 months as needed.   Injection today  LEFT upper ext  Pecs 25U x 2 clavicular head  Biceps 75 medial x2(short head), long head x 1  Brachialis 50 FCR 50   FDP 75 FDS 75 PT 25

## 2023-07-03 NOTE — Progress Notes (Signed)
 Botox Injection for spasticity using needle EMG guidance  Dilution: 50 Units/ml Indication: Severe spasticity which interferes with ADL,mobility and/or  hygiene and is unresponsive to medication management and other conservative care Informed consent was obtained after describing risks and benefits of the procedure with the patient. This includes bleeding, bruising, infection, excessive weakness, or medication side effects. A REMS form is on file and signed. Needle: 27g 1" needle electrode for pecs, 25g 2" for remaining muscles Number of units per muscle LUE Pecs 25U x 2 clavicular head  Biceps 75 medial x2(short head), long head x 1  Brachialis 50 FCR 50  FDP 75 FDS 75 PT 25 All injections were done after obtaining appropriate EMG activity and after negative drawback for blood. The patient tolerated the procedure well. Post procedure instructions were given. A followup appointment was made.

## 2023-07-22 ENCOUNTER — Encounter: Admitting: Physical Medicine & Rehabilitation

## 2023-08-06 ENCOUNTER — Ambulatory Visit (INDEPENDENT_AMBULATORY_CARE_PROVIDER_SITE_OTHER): Payer: Medicaid Other | Admitting: Podiatry

## 2023-08-06 ENCOUNTER — Encounter: Payer: Self-pay | Admitting: Podiatry

## 2023-08-06 VITALS — Ht 65.5 in | Wt 188.0 lb

## 2023-08-06 DIAGNOSIS — Q828 Other specified congenital malformations of skin: Secondary | ICD-10-CM

## 2023-08-06 DIAGNOSIS — M79674 Pain in right toe(s): Secondary | ICD-10-CM | POA: Diagnosis not present

## 2023-08-06 DIAGNOSIS — M79675 Pain in left toe(s): Secondary | ICD-10-CM | POA: Diagnosis not present

## 2023-08-06 DIAGNOSIS — B351 Tinea unguium: Secondary | ICD-10-CM

## 2023-08-06 DIAGNOSIS — L84 Corns and callosities: Secondary | ICD-10-CM

## 2023-08-06 DIAGNOSIS — E1151 Type 2 diabetes mellitus with diabetic peripheral angiopathy without gangrene: Secondary | ICD-10-CM | POA: Diagnosis not present

## 2023-08-06 NOTE — Progress Notes (Signed)
  Subjective:  Patient ID: [name], male    DOB: 1961-03-31,  MRN: [id number]  63 y.o. male presents at risk foot care. Pt has h/o NIDDM with PAD and callus(es) of both feet, porokeratotic lesion(s) right foot, and painful mycotic nails. Painful toenails interfere with ambulation. Aggravating factors include wearing enclosed shoe gear. Pain is relieved with periodic professional debridement. Painful callus(es) and porokeratotic lesion(s) are aggravated when weightbearing with and without shoegear. Pain is relieved with periodic professional debridement. Chief Complaint  Patient presents with   RFC    He is here for a nail and callous trim today, PCP is Dr. Shelda and last seen in Jan 25   New problem(s): None   PCP is Shelda Atlas, MD.  No Known Allergies  Review of Systems: Negative except as noted in the HPI.   Objective:  COCHISE DINNEEN is a pleasant 63 y.o. male overweight, in NAD. AAO x 3.  Vascular Examination: Capillary refill time immediate b/l. Vascular status intact b/l with palpable pedal pulses. Pedal hair absent b/l. No pain with calf compression b/l. Skin temperature gradient WNL b/l. No cyanosis or clubbing b/l. No ischemia or gangrene noted b/l. Trace edema noted BLE.  Neurological Examination: Sensation grossly intact b/l with 10 gram monofilament.  Dermatological Examination: Pedal skin with normal turgor, texture and tone b/l.  No open wounds. No interdigital macerations.   Toenails 2-5 b/l thick, discolored, elongated with subungual debris and pain on dorsal palpation.   Hyperkeratotic lesion(s) L hallux, R hallux, submet head 5 b/l. No erythema, no edema, no drainage, no fluctuance.  Porokeratotic lesion(s) sub 5th met base right foot. No erythema, no edema, no drainage, no fluctuance.  Musculoskeletal Examination: Normal muscle strength 5/5 to all lower extremity muscle groups bilaterally. No pain, crepitus or joint limitation noted with ROM b/l LE.  Pes planus deformity noted bilateral LE.SABRA Patient ambulates with cane assistance.  Radiographs: None  Last A1c:       No data to display           Assessment:   1. Pain due to onychomycosis of toenails of both feet   2. Callus   3. Porokeratosis   4. Type II diabetes mellitus with peripheral circulatory disorder (HCC)    Plan:  -Consent given for treatment as described below: -Examined patient. -Continue supportive shoe gear daily. -Toenails were debrided in length and girth 3-5 bilaterally with sterile nail nippers and dremel without iatrogenic bleeding.  -Callus(es) bilateral great toes and submet head 5 b/l pared utilizing sterile scalpel blade without complication or incident. Total number debrided =4. -Porokeratotic lesion(s) sub 5th met base right foot pared and enucleated with sterile currette without incident. Total number of lesions debrided=1. -Patient/POA to call should there be question/concern in the interim.  Return in about 9 weeks (around 10/08/2023).  Delon LITTIE Merlin, DPM      [place] LOCATION: [address], [place] [address]                   Office [phone]   [facility] LOCATION: [address] [place], [place] [address] Office [phone]

## 2023-09-30 ENCOUNTER — Encounter: Payer: Self-pay | Admitting: Physical Medicine & Rehabilitation

## 2023-09-30 ENCOUNTER — Encounter: Payer: Medicaid Other | Attending: Physical Medicine & Rehabilitation | Admitting: Physical Medicine & Rehabilitation

## 2023-09-30 VITALS — BP 126/84 | HR 95 | Ht 65.5 in | Wt 176.0 lb

## 2023-09-30 DIAGNOSIS — G8114 Spastic hemiplegia affecting left nondominant side: Secondary | ICD-10-CM | POA: Diagnosis present

## 2023-09-30 DIAGNOSIS — I639 Cerebral infarction, unspecified: Secondary | ICD-10-CM | POA: Insufficient documentation

## 2023-09-30 MED ORDER — ONABOTULINUMTOXINA 100 UNITS IJ SOLR
100.0000 [IU] | Freq: Once | INTRAMUSCULAR | Status: AC
  Administered 2023-09-30: 100 [IU] via INTRAMUSCULAR

## 2023-09-30 MED ORDER — SODIUM CHLORIDE (PF) 0.9 % IJ SOLN
8.0000 mL | Freq: Once | INTRAMUSCULAR | Status: AC
  Administered 2023-09-30: 8 mL via INTRAVENOUS

## 2023-09-30 NOTE — Patient Instructions (Addendum)
 We will look at your back next time and evaluate

## 2023-09-30 NOTE — Progress Notes (Signed)
 Botox Injection for spasticity using needle EMG guidance  Dilution: 50 Units/ml Indication: Severe spasticity which interferes with ADL,mobility and/or  hygiene and is unresponsive to medication management and other conservative care Informed consent was obtained after describing risks and benefits of the procedure with the patient. This includes bleeding, bruising, infection, excessive weakness, or medication side effects. A REMS form is on file and signed. Needle: 27g 1" needle electrode for pecs, 25g 2" for remaining muscles Number of units per muscle LUE Pecs 25U x 2 clavicular head  Biceps 75 medial x2(short head), long head x 1  Brachialis 50 FCR 50  FDP 75 FDS 75 PT 25 All injections were done after obtaining appropriate EMG activity and after negative drawback for blood. The patient tolerated the procedure well. Post procedure instructions were given. A followup appointment was made.

## 2023-10-08 ENCOUNTER — Encounter: Payer: Self-pay | Admitting: Podiatry

## 2023-10-08 ENCOUNTER — Ambulatory Visit: Payer: Medicaid Other | Admitting: Podiatry

## 2023-10-08 VITALS — Ht 65.5 in | Wt 176.0 lb

## 2023-10-08 DIAGNOSIS — E1151 Type 2 diabetes mellitus with diabetic peripheral angiopathy without gangrene: Secondary | ICD-10-CM

## 2023-10-08 DIAGNOSIS — L84 Corns and callosities: Secondary | ICD-10-CM

## 2023-10-08 DIAGNOSIS — Q828 Other specified congenital malformations of skin: Secondary | ICD-10-CM

## 2023-10-08 DIAGNOSIS — M79674 Pain in right toe(s): Secondary | ICD-10-CM | POA: Diagnosis not present

## 2023-10-08 DIAGNOSIS — M79675 Pain in left toe(s): Secondary | ICD-10-CM

## 2023-10-08 DIAGNOSIS — B351 Tinea unguium: Secondary | ICD-10-CM

## 2023-10-08 NOTE — Progress Notes (Signed)
  Subjective:  Patient ID: [name], male    DOB: August 22, 1960,  MRN: [id number]  [name] presents to clinic today for at risk foot care. Pt has h/o NIDDM with PAD and callus(es) b/l feet, porokeratotic lesion(s) right foot, and painful mycotic nails. Painful toenails interfere with ambulation. Aggravating factors include wearing enclosed shoe gear. Pain is relieved with periodic professional debridement. Painful callus(es) and porokeratotic lesion(s) are aggravated when weightbearing with and without shoegear. Pain is relieved with periodic professional debridement.  Chief Complaint  Patient presents with   Nail Problem    Pt is here for [facility] unsure of last A1C PCP is Dr Ellamae Gunnels and LOV was last month.   New problem(s): None.   PCP is Donald Congo, MD.  No Known Allergies  Review of Systems: Negative except as noted in the HPI.  Objective: No changes noted in today's physical examination. There were no vitals filed for this visit. [name] is a pleasant 63 y.o. male in NAD. AAO x 3.  Vascular Examination: Capillary refill time immediate b/l. Vascular status intact b/l with palpable pedal pulses. Pedal hair absent b/l. No pain with calf compression b/l. Skin temperature gradient WNL b/l. No cyanosis or clubbing b/l. No ischemia or gangrene noted b/l. Trace edema noted BLE.  Neurological Examination: Sensation grossly intact b/l with 10 gram monofilament.  Dermatological Examination: Pedal skin with normal turgor, texture and tone b/l.  No open wounds. No interdigital macerations.   Toenails 2-5 b/l thick, discolored, elongated with subungual debris and pain on dorsal palpation.   Hyperkeratotic lesion(s) L hallux, R hallux, submet head 5 b/l. No erythema, no edema, no drainage, no fluctuance.  Porokeratotic lesion(s) sub 5th met base right foot. No erythema, no edema, no drainage, no fluctuance.  Musculoskeletal Examination: Normal muscle strength 5/5 to all  lower extremity muscle groups bilaterally. No pain, crepitus or joint limitation noted with ROM b/l LE. Pes planus deformity noted bilateral LE. Patient ambulates with cane assistance.  Radiographs: None  Assessment/Plan: 1. Pain due to onychomycosis of toenails of both feet   2. Callus   3. Porokeratosis   4. Type II diabetes mellitus with peripheral circulatory disorder [facility])    -Patient was evaluated today. All questions/concerns addressed on today's visit. -Continue foot and shoe inspections daily. Monitor blood glucose per PCP/Endocrinologist's recommendations. -Patient to continue soft, supportive shoe gear daily. -Mycotic toenails 1-5 bilaterally were debrided in length and girth with sterile nail nippers and dremel without incident. -Callus(es) L hallux, R hallux, and submet head 5 b/l pared utilizing sterile scalpel blade without complication or incident. Total number debrided =4. -Porokeratotic lesion(s) sub 5th met base left foot pared and enucleated with sterile currette without incident. Total number of lesions debrided=1. -Patient/POA to call should there be question/concern in the interim.   Return in about 9 weeks (around 12/10/2023).  [name], DPM      [facility] LOCATION: [address] [place] [address]                   Office [phone]   [facility] LOCATION: [address] [place], [place] [address] Office [phone]

## 2023-10-21 ENCOUNTER — Encounter: Admitting: Physical Medicine & Rehabilitation

## 2023-10-26 ENCOUNTER — Other Ambulatory Visit: Payer: Self-pay

## 2023-10-26 ENCOUNTER — Encounter (HOSPITAL_COMMUNITY): Payer: Self-pay

## 2023-10-26 ENCOUNTER — Emergency Department (HOSPITAL_COMMUNITY)

## 2023-10-26 ENCOUNTER — Inpatient Hospital Stay (HOSPITAL_COMMUNITY)
Admission: EM | Admit: 2023-10-26 | Discharge: 2023-10-29 | DRG: 690 | Discharge status: Against Medical Advice | Attending: Internal Medicine | Admitting: Internal Medicine

## 2023-10-26 DIAGNOSIS — Z8546 Personal history of malignant neoplasm of prostate: Secondary | ICD-10-CM

## 2023-10-26 DIAGNOSIS — Z87891 Personal history of nicotine dependence: Secondary | ICD-10-CM

## 2023-10-26 DIAGNOSIS — R531 Weakness: Secondary | ICD-10-CM | POA: Diagnosis present

## 2023-10-26 DIAGNOSIS — G4700 Insomnia, unspecified: Secondary | ICD-10-CM | POA: Diagnosis present

## 2023-10-26 DIAGNOSIS — J449 Chronic obstructive pulmonary disease, unspecified: Secondary | ICD-10-CM | POA: Diagnosis present

## 2023-10-26 DIAGNOSIS — W19XXXA Unspecified fall, initial encounter: Secondary | ICD-10-CM | POA: Diagnosis present

## 2023-10-26 DIAGNOSIS — K219 Gastro-esophageal reflux disease without esophagitis: Secondary | ICD-10-CM | POA: Diagnosis present

## 2023-10-26 DIAGNOSIS — N390 Urinary tract infection, site not specified: Secondary | ICD-10-CM | POA: Diagnosis present

## 2023-10-26 DIAGNOSIS — N3000 Acute cystitis without hematuria: Principal | ICD-10-CM | POA: Diagnosis present

## 2023-10-26 DIAGNOSIS — Z5329 Procedure and treatment not carried out because of patient's decision for other reasons: Secondary | ICD-10-CM | POA: Diagnosis present

## 2023-10-26 DIAGNOSIS — Y92000 Kitchen of unspecified non-institutional (private) residence as  the place of occurrence of the external cause: Secondary | ICD-10-CM

## 2023-10-26 DIAGNOSIS — Z7982 Long term (current) use of aspirin: Secondary | ICD-10-CM

## 2023-10-26 DIAGNOSIS — Z808 Family history of malignant neoplasm of other organs or systems: Secondary | ICD-10-CM

## 2023-10-26 DIAGNOSIS — H5462 Unqualified visual loss, left eye, normal vision right eye: Secondary | ICD-10-CM | POA: Diagnosis present

## 2023-10-26 DIAGNOSIS — R5381 Other malaise: Secondary | ICD-10-CM | POA: Diagnosis present

## 2023-10-26 DIAGNOSIS — Z7951 Long term (current) use of inhaled steroids: Secondary | ICD-10-CM

## 2023-10-26 DIAGNOSIS — E876 Hypokalemia: Secondary | ICD-10-CM | POA: Diagnosis present

## 2023-10-26 DIAGNOSIS — Z87442 Personal history of urinary calculi: Secondary | ICD-10-CM

## 2023-10-26 DIAGNOSIS — Z7985 Long-term (current) use of injectable non-insulin antidiabetic drugs: Secondary | ICD-10-CM

## 2023-10-26 DIAGNOSIS — I69354 Hemiplegia and hemiparesis following cerebral infarction affecting left non-dominant side: Secondary | ICD-10-CM

## 2023-10-26 DIAGNOSIS — R29898 Other symptoms and signs involving the musculoskeletal system: Principal | ICD-10-CM

## 2023-10-26 DIAGNOSIS — M109 Gout, unspecified: Secondary | ICD-10-CM | POA: Diagnosis present

## 2023-10-26 DIAGNOSIS — E785 Hyperlipidemia, unspecified: Secondary | ICD-10-CM | POA: Diagnosis present

## 2023-10-26 DIAGNOSIS — B9620 Unspecified Escherichia coli [E. coli] as the cause of diseases classified elsewhere: Secondary | ICD-10-CM | POA: Diagnosis present

## 2023-10-26 DIAGNOSIS — I10 Essential (primary) hypertension: Secondary | ICD-10-CM | POA: Diagnosis present

## 2023-10-26 DIAGNOSIS — Z79899 Other long term (current) drug therapy: Secondary | ICD-10-CM

## 2023-10-26 LAB — CK: Total CK: 220 U/L (ref 49–397)

## 2023-10-26 LAB — COMPREHENSIVE METABOLIC PANEL WITH GFR
ALT: 20 U/L (ref 0–44)
AST: 22 U/L (ref 15–41)
Albumin: 3.4 g/dL — ABNORMAL LOW (ref 3.5–5.0)
Alkaline Phosphatase: 57 U/L (ref 38–126)
Anion gap: 7 (ref 5–15)
BUN: 8 mg/dL (ref 8–23)
CO2: 30 mmol/L (ref 22–32)
Calcium: 8.6 mg/dL — ABNORMAL LOW (ref 8.9–10.3)
Chloride: 102 mmol/L (ref 98–111)
Creatinine, Ser: 0.97 mg/dL (ref 0.61–1.24)
GFR, Estimated: >60 mL/min (ref >60)
Glucose, Bld: 85 mg/dL (ref 70–99)
Potassium: 3.6 mmol/L (ref 3.5–5.1)
Sodium: 139 mmol/L (ref 135–145)
Total Bilirubin: 1.4 mg/dL — ABNORMAL HIGH (ref 0.0–1.2)
Total Protein: 6.2 g/dL — ABNORMAL LOW (ref 6.5–8.1)

## 2023-10-26 LAB — CBC WITH DIFFERENTIAL/PLATELET
Abs Immature Granulocytes: 0.04 10*3/uL (ref 0.00–0.07)
Basophils Absolute: 0 10*3/uL (ref 0.0–0.1)
Basophils Relative: 0 %
Eosinophils Absolute: 0 10*3/uL (ref 0.0–0.5)
Eosinophils Relative: 1 %
HCT: 54.9 % — ABNORMAL HIGH (ref 39.0–52.0)
Hemoglobin: 17.9 g/dL — ABNORMAL HIGH (ref 13.0–17.0)
Immature Granulocytes: 1 %
Lymphocytes Relative: 19 %
Lymphs Abs: 1.6 10*3/uL (ref 0.7–4.0)
MCH: 32.5 pg (ref 26.0–34.0)
MCHC: 32.6 g/dL (ref 30.0–36.0)
MCV: 99.6 fL (ref 80.0–100.0)
Monocytes Absolute: 0.6 10*3/uL (ref 0.1–1.0)
Monocytes Relative: 7 %
Neutro Abs: 5.8 10*3/uL (ref 1.7–7.7)
Neutrophils Relative %: 72 %
Platelets: 148 10*3/uL — ABNORMAL LOW (ref 150–400)
RBC: 5.51 MIL/uL (ref 4.22–5.81)
RDW: 16.2 % — ABNORMAL HIGH (ref 11.5–15.5)
WBC: 8.1 10*3/uL (ref 4.0–10.5)
nRBC: 0 % (ref 0.0–0.2)

## 2023-10-26 LAB — URINALYSIS, ROUTINE W REFLEX MICROSCOPIC
Bilirubin Urine: NEGATIVE
Glucose, UA: NEGATIVE mg/dL
Hgb urine dipstick: NEGATIVE
Ketones, ur: NEGATIVE mg/dL
Nitrite: POSITIVE — AB
Protein, ur: 30 mg/dL — AB
Specific Gravity, Urine: 1.016 (ref 1.005–1.030)
pH: 6 (ref 5.0–8.0)

## 2023-10-26 MED ORDER — SODIUM CHLORIDE 0.9 % IV SOLN
2.0000 g | Freq: Once | INTRAVENOUS | Status: AC
  Administered 2023-10-26: 2 g via INTRAVENOUS
  Filled 2023-10-26: qty 20

## 2023-10-26 MED ORDER — PANTOPRAZOLE SODIUM 40 MG PO TBEC
40.0000 mg | DELAYED_RELEASE_TABLET | Freq: Two times a day (BID) | ORAL | Status: DC
Start: 2023-10-26 — End: 2023-10-29
  Administered 2023-10-26 – 2023-10-29 (×6): 40 mg via ORAL
  Filled 2023-10-26 (×6): qty 1

## 2023-10-26 MED ORDER — ACETAMINOPHEN 650 MG RE SUPP: 650.0000 mg | Freq: Four times a day (QID) | RECTAL | Status: DC | PRN

## 2023-10-26 MED ORDER — ENOXAPARIN SODIUM 40 MG/0.4ML IJ SOSY
40.0000 mg | PREFILLED_SYRINGE | INTRAMUSCULAR | Status: DC
  Administered 2023-10-26 – 2023-10-28 (×3): 40 mg via SUBCUTANEOUS
  Filled 2023-10-26 (×3): qty 0.4

## 2023-10-26 MED ORDER — FESOTERODINE FUMARATE ER 4 MG PO TB24
4.0000 mg | ORAL_TABLET | Freq: Every day | ORAL | Status: DC
  Administered 2023-10-26 – 2023-10-28 (×3): 4 mg via ORAL
  Filled 2023-10-26 (×3): qty 1

## 2023-10-26 MED ORDER — COLCHICINE 0.6 MG PO TABS
0.6000 mg | ORAL_TABLET | Freq: Every day | ORAL | Status: DC
  Administered 2023-10-26 – 2023-10-29 (×4): 0.6 mg via ORAL
  Filled 2023-10-26 (×4): qty 1

## 2023-10-26 MED ORDER — ALLOPURINOL 300 MG PO TABS
300.0000 mg | ORAL_TABLET | Freq: Every morning | ORAL | Status: DC
Start: 2023-10-27 — End: 2023-10-29
  Administered 2023-10-27 – 2023-10-29 (×3): 300 mg via ORAL
  Filled 2023-10-26 (×3): qty 1

## 2023-10-26 MED ORDER — LOSARTAN POTASSIUM 25 MG PO TABS
25.0000 mg | ORAL_TABLET | Freq: Every day | ORAL | Status: DC
  Administered 2023-10-26 – 2023-10-29 (×4): 25 mg via ORAL
  Filled 2023-10-26 (×4): qty 1

## 2023-10-26 MED ORDER — ONDANSETRON HCL 4 MG/2ML IJ SOLN: 4.0000 mg | Freq: Four times a day (QID) | INTRAMUSCULAR | Status: DC | PRN

## 2023-10-26 MED ORDER — ONDANSETRON HCL 4 MG PO TABS: 4.0000 mg | ORAL_TABLET | Freq: Four times a day (QID) | ORAL | Status: DC | PRN

## 2023-10-26 MED ORDER — SODIUM CHLORIDE 0.9 % IV SOLN
2.0000 g | INTRAVENOUS | Status: DC
  Administered 2023-10-27 – 2023-10-28 (×2): 2 g via INTRAVENOUS
  Filled 2023-10-26 (×2): qty 20

## 2023-10-26 MED ORDER — SIMVASTATIN 20 MG PO TABS
20.0000 mg | ORAL_TABLET | Freq: Every day | ORAL | Status: DC
  Administered 2023-10-26 – 2023-10-28 (×3): 20 mg via ORAL
  Filled 2023-10-26 (×3): qty 1

## 2023-10-26 MED ORDER — BACLOFEN 10 MG PO TABS
10.0000 mg | ORAL_TABLET | Freq: Three times a day (TID) | ORAL | Status: DC
  Administered 2023-10-26 – 2023-10-29 (×8): 10 mg via ORAL
  Filled 2023-10-26 (×8): qty 1

## 2023-10-26 MED ORDER — TRAZODONE HCL 50 MG PO TABS
50.0000 mg | ORAL_TABLET | Freq: Every day | ORAL | Status: DC
Start: 2023-10-26 — End: 2023-10-29
  Administered 2023-10-26 – 2023-10-28 (×3): 50 mg via ORAL
  Filled 2023-10-26 (×3): qty 1

## 2023-10-26 MED ORDER — ACETAMINOPHEN 325 MG PO TABS: 650.0000 mg | ORAL_TABLET | Freq: Four times a day (QID) | ORAL | Status: DC | PRN

## 2023-10-26 NOTE — H&P (Addendum)
 History and Physical    [name] ZOX:[id number] DOB: 01-30-1961 DOA: 10/26/2023  PCP: Charle Congo, MD   Chief Complaint:  weakness  HPI: [name] is a 63 y.o. male with medical history significant of COPD, GERD who presented to the after a fall.  Patient was in his kitchen where he felt his legs were weak.  He fell down and was unable to get up for 5 hours.  He presented to the ER where he was found to be afebrile and hemodynamically stable.  He has baseline underlying deficits on his left side due to prior stroke.  He also endorsed feeling short of breath.  Labs were obtained on patient which showed WBC 8.1, hemoglobin 17.9, platelets 148, urinalysis concerning for infection, CMP unrevealing, CK2 120.  Chest x-ray was obtained which no infiltrates.  CT head was unrevealing.  X-ray of pelvis showed no acute findings.  Patient was given CTX and was admitted for workup.   Review of Systems: Review of Systems  Constitutional:  Positive for malaise/fatigue. Negative for chills and fever.  HENT: Negative.    Eyes: Negative.   Respiratory: Negative.    Cardiovascular: Negative.   Gastrointestinal: Negative.   Genitourinary: Negative.   Musculoskeletal: Negative.   Skin: Negative.   Neurological:  Positive for weakness.  Endo/Heme/Allergies: Negative.   Psychiatric/Behavioral: Negative.    All other systems reviewed and are negative.    As per HPI otherwise 10 point review of systems negative.   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   History of gout    History of kidney stones    Hyperlipidemia    Hypertension    Prostate cancer  [facility]) urologist-- dr Lea Primmer;  oncologist-  dr Lorri Rota   dx 07-20-2017   Renal calculi    Stroke (HCC) sept 26,  20006    hx mini strokes in past, 1 large stroke   Weakness of left side of body 03/2005   cva residual   Weakness of one side of body    L sided weakness after stroke    Past Surgical History:  Procedure Laterality Date   COLONOSCOPY WITH PROPOFOL  N/A 09/27/2015   Procedure: COLONOSCOPY WITH PROPOFOL ;  Surgeon: Evangeline Hilts, MD;  Location: [facility];  Service: Endoscopy;  Laterality: N/A;   CYSTOSCOPY N/A 10/05/2012   Procedure: CYSTOSCOPY, retrograde and right stent placement;  Surgeon: Osborn Blaze, MD;  Location: [facility];  Service: Urology;  Laterality: N/A;   CYSTOSCOPY WITH RETROGRADE PYELOGRAM, URETEROSCOPY AND STENT PLACEMENT Right 04/13/2014   Procedure: CYSTOSCOPY WITH RETROGRADE PYELOGRAM, URETEROSCOPY AND STENT PLACEMENT, LASER ABLATION OF SCAR TISSUE;  Surgeon: Osborn Blaze, MD;  Location: [facility];  Service: Urology;  Laterality: Right;   EYE SURGERY Left age 2   HOLMIUM LASER APPLICATION Right 04/13/2014   Procedure: HOLMIUM LASER APPLICATION;  Surgeon: Osborn Blaze, MD;  Location: [facility];  Service: Urology;  Laterality: Right;   NEPHROLITHOTOMY Right 10/05/2012   Procedure: NEPHROLITHOTOMY PERCUTANEOUS;  Surgeon: Osborn Blaze, MD;  Location: [facility];  Service: Urology;  Laterality: Right;  WITH CYSTO AND SURGEON ACCESS  NEPHROLITHOTOMY Right 10/07/2012   Procedure: NEPHROLITHOTOMY PERCUTANEOUS SECOND LOOK. Stent exchange,Ureteroscopy ;  Surgeon: Osborn Blaze, MD;  Location: [facility];  Service: Urology;  Laterality: Right;   RADIOACTIVE SEED IMPLANT N/A 11/12/2018   Procedure: RADIOACTIVE SEED IMPLANT/BRACHYTHERAPY IMPLANT;  Surgeon: Osborn Blaze, MD;  Location: [facility];  Service: Urology;  Laterality: N/A;  90 MINS   SPACE OAR INSTILLATION N/A 11/12/2018   Procedure: SPACE OAR INSTILLATION;  Surgeon: Osborn Blaze, MD;  Location: [facility];  Service:  Urology;  Laterality: N/A;   TOENAIL EXCISION       reports that he has been smoking cigarettes. He has a 7.5 pack-year smoking history. He has never used smokeless tobacco. He reports that he does not drink alcohol and does not use drugs.  Family History  Problem Relation Age of Onset   Brain cancer Maternal Grandfather     Prior to Admission medications   Medication Sig Start Date End Date Taking? Authorizing Provider  albuterol  (PROVENTIL  HFA;VENTOLIN  HFA) 108 (90 BASE) MCG/ACT inhaler Inhale 1-2 puffs into the lungs every 6 (six) hours as needed for wheezing or shortness of breath. Reported on 07/31/2015   Yes [provider]  allopurinol  (ZYLOPRIM ) 300 MG tablet Take 300 mg by mouth every morning.    Yes [provider]  baclofen  (LIORESAL ) 10 MG tablet Take 10 mg by mouth 3 (three) times daily.    Yes [provider]  budesonide-formoterol (SYMBICORT) 160-4.5 MCG/ACT inhaler Inhale 2 puffs into the lungs 2 (two) times daily.   Yes [provider]  cetirizine  (ZYRTEC ) 10 MG tablet Take 10 mg by mouth every morning. Reported on 07/31/2015   Yes [provider]  colchicine  0.6 MG tablet Take 0.6 mg by mouth daily. 01/07/22  Yes [provider]  fluticasone  (FLONASE ) 50 MCG/ACT nasal spray Place 2 sprays into both nostrils in the morning and at bedtime. 06/06/20  Yes [provider]  furosemide  (LASIX ) 20 MG tablet Take 20 mg by mouth in the morning. Reported on 07/31/2015   Yes [provider]  hydrOXYzine (ATARAX/VISTARIL) 25 MG tablet Take 25 mg by mouth 3 (three) times daily as needed for itching or anxiety.   Yes [provider]  latanoprost (XALATAN) 0.005 % ophthalmic solution Place 1 drop into both eyes at bedtime.   Yes [provider]  losartan (COZAAR) 25 MG tablet Take 25 mg by mouth daily. 07/17/22  Yes [provider]  pantoprazole  (PROTONIX ) 40 MG tablet Take 40 mg by mouth in the  morning and at bedtime.   Yes [provider]  potassium citrate (UROCIT-K) 10 MEQ (1080 MG) SR tablet Take 10 mEq by mouth in the morning and at bedtime.   Yes [provider]  simvastatin  (ZOCOR ) 20 MG tablet Take 20 mg by mouth at bedtime.   Yes [provider]  solifenacin (VESICARE) 10 MG tablet Take 10 mg by mouth in the morning and at bedtime. 01/10/22  Yes [provider]  tiZANidine (ZANAFLEX) 4 MG tablet Take 4 mg by mouth 2 (two) times daily as needed for muscle spasms. 11/24/20  Yes [provider]  traMADol (ULTRAM) 50 MG tablet Take 50 mg by mouth every 6 (six) hours as needed (for pain).   Yes [provider]  traZODone (DESYREL) 50 MG tablet Take 50 mg by mouth at bedtime.   Yes [provider]  TRULICITY  4.5 MG/0.5ML SOPN Inject 4.5 mg into the skin every Sunday. 10/15/22  Yes [provider]  Vitamin D , Ergocalciferol , (DRISDOL) 1.25 MG (50000 UNIT) CAPS capsule Take 50,000 Units by mouth every Sunday. 07/20/21  Yes [provider]  HYDROcodone -acetaminophen  (NORCO/VICODIN) 5-325 MG tablet Take 1-2 tablets by mouth every 6 (six) hours as needed for moderate pain or severe pain. Patient not taking: Reported on 10/26/2023 11/12/18   Melody Spurling., MD  urea  (CARMOL) 40 % CREA Apply 1 application topically 2 (two) times daily. Patient not taking: Reported on 10/26/2023 06/26/20   Floyce Hutching, DPM    Physical Exam: Vitals:   10/26/23 1300 10/26/23 1315 10/26/23 1459 10/26/23 1634  BP:   (!) 153/94 127/85  Pulse: 92 89 95 95  Resp:   20 16  Temp:   98.4 F (36.9 C) 98.6 F (37 C)  TempSrc:   Oral   SpO2: 95% 96% 100% 97%  Weight:      Height:       Physical Exam Constitutional:      Appearance: He is normal weight.  HENT:     Head: Normocephalic.     Mouth/Throat:     Mouth: Mucous membranes are moist.     Pharynx: Oropharynx is clear.  Eyes:     Conjunctiva/sclera: Conjunctivae  normal.     Pupils: Pupils are equal, round, and reactive to light.  Cardiovascular:     Rate and Rhythm: Normal rate and regular rhythm.     Pulses: Normal pulses.     Heart sounds: Normal heart sounds.  Pulmonary:     Effort: Pulmonary effort is normal.     Breath sounds: Normal breath sounds.  Abdominal:     General: Abdomen is flat. Bowel sounds are normal.  Musculoskeletal:        General: Normal range of motion.     Cervical back: Normal range of motion.  Skin:    General: Skin is warm.     Capillary Refill: Capillary refill takes less than 2 seconds.     Coloration: Skin is not jaundiced.  Neurological:     General: No focal deficit present.     Mental Status: He is alert and oriented to person, place, and time.  Psychiatric:        Mood and Affect: Mood normal.        Labs on Admission: I have personally reviewed the patients's labs and imaging studies.  Assessment/Plan Principal Problem:   Urinary tract infection   # Weakness most likely secondary to urinary tract infection -Patient found to have urinalysis concerning for infection - Acute on chronic weakness in legs.  Patient has pre-existing deficits on left side  Plan: Continue ceftriaxone  Status post IV fluids  # history of gout-continue colchicine , allopurinol   # GERD-continue Protonix   # Hyperlipidemia-continue Zocor   # Insomnia-continue trazodone  # Urinary retention-continue to toviaz   Admission status: Observation Med-Surg  Certification: The appropriate patient status for this patient is OBSERVATION. Observation status is judged to be reasonable and necessary in order to provide the required intensity of service to ensure the patient's safety. The patient's presenting symptoms, physical exam findings, and initial radiographic and laboratory data in the context of their medical condition is felt to place them at decreased risk for further clinical deterioration. Furthermore, it is anticipated  that the patient will be medically stable for discharge from the hospital within 2 midnights of admission.     Myrl Askew MD [facility] If 7PM-7AM, please contact night-coverage [email or web address]  10/26/2023, 5:50 PM

## 2023-10-26 NOTE — Plan of Care (Signed)

## 2023-10-26 NOTE — ED Triage Notes (Signed)
 Pt states that he "went down" in the kitchen today out of nowhere. Pt has left sided deficits from his stroke years ago. Pt was on the floor for hours waiting for his aid to come in today. Pt denies LOC or hitting his head. Pt states he just lost function of his legs and wants to know if this is going to be an ongoing issue.

## 2023-10-26 NOTE — ED Provider Notes (Signed)
 [place] EMERGENCY DEPARTMENT AT [facility] Provider Note   CSN: [id number] Arrival date & time: 10/26/23  1032     History  Chief Complaint  Patient presents with   Extremity Weakness   Fall    [name] is a 63 y.o. male.  63 y.o male with a PMH of COPD, GERD presents to the ED via EMS status post fall.  Patient reports he was ambulating to the kitchen earlier this morning when suddenly he felt that both of his legs went weak.  He reports he laid on the ground from 3 AM until 8 AM when he finally had the strength to pull himself back up.  He does have underlying history of a stroke with left hemiparesis, he reports he feels that his right leg does have good strength however he felt both of them give out.  Patient also endorses feeling more short of breath over the last couple of days, especially on his left side, especially with any deep inspiration.  Also endorses a dry cough.  No fever, no chest pain, no abdominal pain, no other complaints.   The history is provided by the patient.  Extremity Weakness This is a new problem. Pertinent negatives include no chest pain, no abdominal pain, no headaches and no shortness of breath. Nothing relieves the symptoms. He has tried nothing for the symptoms.  Fall Pertinent negatives include no chest pain, no abdominal pain, no headaches and no shortness of breath.       Home Medications Prior to Admission medications   Medication Sig Start Date End Date Taking? Authorizing Provider  albuterol  (PROVENTIL  HFA;VENTOLIN  HFA) 108 (90 BASE) MCG/ACT inhaler Inhale 1-2 puffs into the lungs every 6 (six) hours as needed for wheezing or shortness of breath. Reported on 07/31/2015    [provider]  allopurinol  (ZYLOPRIM ) 300 MG tablet Take 300 mg by mouth every morning.     [provider]  aspirin EC 81 MG tablet Take 81 mg by mouth every morning.     [provider]  baclofen  (LIORESAL ) 10 MG tablet  Take 10 mg by mouth 3 (three) times daily.     [provider]  baclofen  (LIORESAL ) 20 MG tablet Take 20 mg by mouth 3 (three) times daily. 06/06/20   [provider]  budesonide-formoterol (SYMBICORT) 160-4.5 MCG/ACT inhaler Inhale 2 puffs into the lungs 2 (two) times daily.    [provider]  cetirizine  (ZYRTEC ) 10 MG tablet Take 10 mg by mouth every morning. Reported on 07/31/2015    [provider]  chlorhexidine  (PERIDEX) 0.12 % solution RINSE WITH FOR 30 SECONDS AND SPIT USE TWICE DAILY IN THE MORNING AND AFTERNOON AFTER BRUSHING. DO NOT SWALLOW 11/16/18   [provider]  clotrimazole-betamethasone (LOTRISONE) cream Apply 1 application topically 2 (two) times daily. 05/19/20   [provider]  colchicine  0.6 MG tablet Take 0.6 mg by mouth daily as needed. 01/07/22   [provider]  diclofenac Sodium (VOLTAREN) 1 % GEL SMARTSIG:4 Gram(s) Topical 4 Times Daily PRN 08/28/20   [provider]  fluticasone  (FLONASE ) 50 MCG/ACT nasal spray Place into both nostrils. 06/06/20   [provider]  furosemide  (LASIX ) 20 MG tablet Take 20 mg by mouth daily at 10 pm. Reported on 07/31/2015    [provider]  HYDROcodone -acetaminophen  (NORCO/VICODIN) 5-325 MG tablet Take 1-2 tablets by mouth every 6 (six) hours as needed for moderate pain or severe pain. 11/12/18  Melody Spurling., MD  hydrOXYzine (ATARAX/VISTARIL) 25 MG tablet Take 25 mg by mouth 3 (three) times daily as needed.    [provider]  losartan (COZAAR) 25 MG tablet Take 25 mg by mouth daily. 07/17/22   [provider]  Multiple Vitamin (MULTIVITAMIN WITH MINERALS) TABS tablet Take 1 tablet by mouth every morning.    [provider]  nitrofurantoin, macrocrystal-monohydrate, (MACROBID) 100 MG capsule Take 100 mg by mouth 2 (two) times daily. 04/01/22   [provider]  pantoprazole  (PROTONIX ) 40 MG tablet Take 40 mg  by mouth daily.    [provider]  potassium citrate (UROCIT-K) 10 MEQ (1080 MG) SR tablet Take 10 mEq by mouth 2 (two) times daily at 10 AM and 5 PM. Reported on 07/31/2015    [provider]  predniSONE (DELTASONE) 20 MG tablet Take by mouth. 06/06/22   [provider]  simvastatin  (ZOCOR ) 20 MG tablet Take 20 mg by mouth at bedtime.    [provider]  solifenacin (VESICARE) 10 MG tablet Take 10 mg by mouth daily. 01/10/22   [provider]  tamsulosin (FLOMAX) 0.4 MG CAPS capsule Take 0.4 mg by mouth daily. 06/06/20   [provider]  tiZANidine (ZANAFLEX) 4 MG tablet Take 4 mg by mouth 2 (two) times daily as needed. 11/24/20   [provider]  traMADol (ULTRAM) 50 MG tablet Take 50 mg by mouth every 6 (six) hours as needed.    [provider]  traZODone (DESYREL) 50 MG tablet Take 50 mg by mouth at bedtime.    [provider]  TRULICITY  4.5 MG/0.5ML SOPN SMARTSIG:4.5 Milligram(s) SUB-Q Once a Week 10/15/22   [provider]  urea  (CARMOL) 40 % CREA Apply 1 application topically 2 (two) times daily. 06/26/20   McDonald, Adam R, DPM  Vitamin D , Ergocalciferol , (DRISDOL) 1.25 MG (50000 UNIT) CAPS capsule Take 50,000 Units by mouth once a week. 07/20/21   [provider]      Allergies    Patient has no known allergies.    Review of Systems   Review of Systems  Constitutional:  Negative for chills and fever.  HENT:  Negative for sore throat.   Respiratory:  Negative for shortness of breath.   Cardiovascular:  Negative for chest pain.  Gastrointestinal:  Negative for abdominal pain.  Genitourinary:  Negative for flank pain.  Musculoskeletal:  Positive for extremity weakness. Negative for back pain.  Neurological:  Positive for weakness. Negative for light-headedness and headaches.  All other systems reviewed and are negative.   Physical Exam Updated Vital Signs BP (!) 153/94 (BP Location:  Right Arm)   Pulse 95   Temp 98.4 F (36.9 C) (Oral)   Resp 20   Ht 5\' 5"  (1.651 m)   Wt 77.1 kg   SpO2 100%   BMI 28.29 kg/m  Physical Exam Vitals and nursing note reviewed.  Constitutional:      Appearance: Normal appearance.  HENT:     Head: Normocephalic and atraumatic.     Comments: No obvious signs of trauma, no hematoma or bruising noted.    Mouth/Throat:     Mouth: Mucous membranes are dry.  Cardiovascular:     Rate and Rhythm: Normal rate.  Pulmonary:     Effort: Pulmonary effort is normal.     Breath sounds: No wheezing.  Abdominal:     General: Abdomen is flat.     Palpations: Abdomen is soft.  Tenderness: There is no abdominal tenderness.  Musculoskeletal:     Cervical back: Normal range of motion and neck supple.     Comments: No pain with palpation of his back. Decreased strength to the left with leg extension and flexion from his prior left hemiparesis status post stroke. Right leg 5/5 with flexion and extension.    Skin:    General: Skin is warm and dry.  Neurological:     Mental Status: He is alert and oriented to person, place, and time.     ED Results / Procedures / Treatments   Labs (all labs ordered are listed, but only abnormal results are displayed) Labs Reviewed  CBC WITH DIFFERENTIAL/PLATELET - Abnormal; Notable for the following components:      Result Value   Hemoglobin 17.9 (*)    HCT 54.9 (*)    RDW 16.2 (*)    Platelets 148 (*)    All other components within normal limits  URINALYSIS, ROUTINE W REFLEX MICROSCOPIC - Abnormal; Notable for the following components:   Color, Urine AMBER (*)    APPearance CLOUDY (*)    Protein, ur 30 (*)    Nitrite POSITIVE (*)    Leukocytes,Ua TRACE (*)    Bacteria, UA MANY (*)    All other components within normal limits  COMPREHENSIVE METABOLIC PANEL WITH GFR - Abnormal; Notable for the following components:   Calcium 8.6 (*)    Total Protein 6.2 (*)    Albumin 3.4 (*)    Total Bilirubin  1.4 (*)    All other components within normal limits  URINE CULTURE  CK    EKG None  Radiology DG Chest 1 View Result Date: 10/26/2023 CLINICAL DATA:  SOB EXAM: PORTABLE CHEST 1 VIEW COMPARISON:  07/06/2019. FINDINGS: Cardiac silhouette appears prominent. No pneumonia or pulmonary edema. No pneumothorax or pleural effusion. Aorta is calcified. Convex right midthoracic scoliosis noted. IMPRESSION: Prominent cardiac silhouette.  Lungs are clear Electronically Signed   By: Sydell Eva M.D.   On: 10/26/2023 14:28   CT HEAD WO CONTRAST ( ) Result Date: 10/26/2023 CLINICAL DATA:  Unwitnessed fall today. EXAM: CT HEAD WITHOUT CONTRAST TECHNIQUE: Contiguous axial images were obtained from the base of the skull through the vertex without intravenous contrast. RADIATION DOSE REDUCTION: This exam was performed according to the departmental dose-optimization program which includes automated exposure control, adjustment of the mA and/or kV according to patient size and/or use of iterative reconstruction technique. COMPARISON:  CT head without contrast 08/11/2013 FINDINGS: Brain: Remote infarcts of the right superior temporal gyrus, insular cortex and right lentiform nucleus is stable. Remote lacunar infarcts are present within the thalami bilaterally. A remote lacunar infarct is present the posterior left lentiform nucleus. Periventricular white matter hypoattenuation is mildly advanced for age. No acute infarct, hemorrhage, or mass lesion is present. Deep brain nuclei are otherwise within normal limits. The ventricles are of normal size. No significant extraaxial fluid collection is present. Vascular: Atherosclerotic calcifications are present within the cavernous internal carotid artery and at the dural margin of the left vertebral artery. No hyperdense vessel is present. Skull: Calvarium is intact. No focal lytic or blastic lesions are present. No significant extracranial soft tissue lesion is present.  Sinuses/Orbits: Left lens is chronically displaced. Globes and orbits are otherwise within normal limits. Mild mucosal thickening is present within the anterior ethmoid air cells and lateral right frontal sinus. The paranasal sinuses and mastoid air cells are otherwise clear. IMPRESSION: 1. No acute intracranial abnormality or  significant interval change. 2. Remote infarcts of the right superior temporal gyrus, insular cortex, and right lentiform nucleus. 3. Remote lacunar infarcts of the thalami bilaterally and posterior left lentiform nucleus. 4. Periventricular white matter hypoattenuation is mildly advanced for age. This likely reflects the sequela of chronic microvascular ischemia. Electronically Signed   By: Audree Leas M.D.   On: 10/26/2023 12:09   DG Pelvis 1-2 Views Result Date: 10/26/2023 CLINICAL DATA:  Fall today. EXAM: PELVIS - 1 VIEW COMPARISON:  None Available. FINDINGS: There is no evidence of pelvic fracture or diastasis. No pelvic bone lesions are seen. IMPRESSION: Negative one view pelvis. Electronically Signed   By: Audree Leas M.D.   On: 10/26/2023 12:02   DG Lumbar Spine Complete Result Date: 10/26/2023 CLINICAL DATA:  Weakness.  Fall today. EXAM: LUMBAR SPINE - COMPLETE 4 VIEW COMPARISON:  CT of the abdomen pelvis 10/15/2012. FINDINGS: Mild levoconvex curvature is present in the lumbar spine. Vertebral body heights and AP alignment are normal. Asymmetric left-sided facet degenerative changes are present at L5-S1. Atherosclerotic changes are present in the abdominal aorta without definite aneurysm. Seeds are noted within the prostate gland. IMPRESSION: 1. No acute abnormality. 2. Asymmetric left-sided facet degenerative changes at L5-S1. 3. Aortic atherosclerosis. Electronically Signed   By: Audree Leas M.D.   On: 10/26/2023 12:02    Procedures Procedures    Medications Ordered in ED Medications  cefTRIAXone  (ROCEPHIN ) 2 g in sodium chloride  0.9 % 100  mL IVPB (2 g Intravenous New Bag/Given 10/26/23 1532)    ED Course/ Medical Decision Making/ A&P Clinical Course as of 10/26/23 1534  Sun Oct 26, 2023  1310 Nitrite(!): POSITIVE [JS]  1310 Bacteria, UA(!): MANY [JS]  1310 WBC, UA: 11-20 [JS]    Clinical Course User Index [JS] Arvetta Araque, PA-C                                 Medical Decision Making Amount and/or Complexity of Data Reviewed Labs: ordered. Decision-making details documented in ED Course. Radiology: ordered.   This patient presents to the ED for concern of leg weakness, this involves a number of treatment options, and is a complaint that carries with it a high risk of complications and morbidity.  The differential diagnosis includes recurrent CVA, infection versus electrolyte derangement.    Co morbidities: Discussed in HPI   Brief History:  See HPI.   EMR reviewed including pt PMHx, past surgical history and past visits to ER.   See HPI for more details   Lab Tests:  I ordered and independently interpreted labs.  The pertinent results include:    CBC with no leukocytosis, hemoglobin is within normal limits.  CMP with no electrolyte derangement, creatinine levels unremarkable.  LFTs are within normal limits.  No anion gap.  UA with some positive nitrites, trace leukocytes, many bacteria 11-20 white blood cell count.  Imaging Studies:  CT Head showed: IMPRESSION:  1. No acute intracranial abnormality or significant interval change.  2. Remote infarcts of the right superior temporal gyrus, insular  cortex, and right lentiform nucleus.  3. Remote lacunar infarcts of the thalami bilaterally and posterior  left lentiform nucleus.  4. Periventricular white matter hypoattenuation is mildly advanced  for age. This likely reflects the sequela of chronic microvascular  ischemia.   Medicines ordered:  I ordered medication including rocephin   for symptomatic treatment Reevaluation of the patient after  these medicines  showed that the patient stayed the same I have reviewed the patients home medicines and have made adjustments as needed  Reevaluation:  After the interventions noted above I re-evaluated patient and found that they have :stayed the same  Social Determinants of Health:  The patient's social determinants of health were a factor in the care of this patient  Problem List / ED Course:  Patient presents to the ED with a chief complaint of leg weakness, reports he was ambulating to the kitchen when suddenly he felt both of his legs were weak and caused him to fall.  He did not hit his head, there was no loss of consciousness.  He is not on any blood thinners.  Has a prior history of a CVA with left hemiparesis, on exam he does not appear weaker on the right, has good strength to right upper and lower extremities.  Left leg is at his baseline per patient.  No pain with palpation of his lumbar spine.  Plain films were obtained which were negative for any abnormality of his lumbar spine and pelvis. Division of his blood work reveals CBC that is that any acute findings, no leukocytosis.  CMP with no electrolyte derangement, creatinine was unremarkable.  LFTs are within normal limits.  UA is remarkable for some nitrites, trace of leukocytes, many bacteria and 11-20 white blood cell count, this was also sent for culture.  He did receive 2 g of Rocephin  while in the emergency department. Due to patient's underlying medical history, he currently lives alone, did have a fall and laid there for 8 hours, I did try to obtain a CK, however specimen hemolyzed and this is currently recollecting. CT of his head does show remote infarcts, however nothing acute at this time.  I do feel that patient is bed manage inpatient with IV antibiotics to treat his urinary tract infection.  Patient is agreeable obtained and treatment, will call hospitalist for admission. Spoke to hospitalist who agrees with plan and  management.   Dispostion:  After consideration of the diagnostic results and the patients response to treatment, I feel that the patent would benefit from admission for further UTI management.    Portions of this note were generated with Scientist, clinical (histocompatibility and immunogenetics). Dictation errors may occur despite best attempts at proofreading.   Final Clinical Impression(s) / ED Diagnoses Final diagnoses:  Weakness of both lower extremities  Acute cystitis without hematuria    Rx / DC Orders ED Discharge Orders     None         Luellen Sages, PA-C 10/26/23 1534    Almond Army, MD 10/27/23 2225

## 2023-10-27 DIAGNOSIS — K219 Gastro-esophageal reflux disease without esophagitis: Secondary | ICD-10-CM | POA: Diagnosis present

## 2023-10-27 DIAGNOSIS — E876 Hypokalemia: Secondary | ICD-10-CM | POA: Diagnosis present

## 2023-10-27 DIAGNOSIS — Z79899 Other long term (current) drug therapy: Secondary | ICD-10-CM | POA: Diagnosis not present

## 2023-10-27 DIAGNOSIS — Z7985 Long-term (current) use of injectable non-insulin antidiabetic drugs: Secondary | ICD-10-CM | POA: Diagnosis not present

## 2023-10-27 DIAGNOSIS — Y92000 Kitchen of unspecified non-institutional (private) residence as  the place of occurrence of the external cause: Secondary | ICD-10-CM | POA: Diagnosis not present

## 2023-10-27 DIAGNOSIS — W19XXXA Unspecified fall, initial encounter: Secondary | ICD-10-CM | POA: Diagnosis present

## 2023-10-27 DIAGNOSIS — R5381 Other malaise: Secondary | ICD-10-CM | POA: Diagnosis present

## 2023-10-27 DIAGNOSIS — E785 Hyperlipidemia, unspecified: Secondary | ICD-10-CM | POA: Diagnosis present

## 2023-10-27 DIAGNOSIS — M109 Gout, unspecified: Secondary | ICD-10-CM | POA: Diagnosis present

## 2023-10-27 DIAGNOSIS — Z7951 Long term (current) use of inhaled steroids: Secondary | ICD-10-CM | POA: Diagnosis not present

## 2023-10-27 DIAGNOSIS — N390 Urinary tract infection, site not specified: Secondary | ICD-10-CM | POA: Diagnosis present

## 2023-10-27 DIAGNOSIS — I69354 Hemiplegia and hemiparesis following cerebral infarction affecting left non-dominant side: Secondary | ICD-10-CM | POA: Diagnosis not present

## 2023-10-27 DIAGNOSIS — N3000 Acute cystitis without hematuria: Secondary | ICD-10-CM | POA: Diagnosis present

## 2023-10-27 DIAGNOSIS — Z7982 Long term (current) use of aspirin: Secondary | ICD-10-CM | POA: Diagnosis not present

## 2023-10-27 DIAGNOSIS — Z87442 Personal history of urinary calculi: Secondary | ICD-10-CM | POA: Diagnosis not present

## 2023-10-27 DIAGNOSIS — G4700 Insomnia, unspecified: Secondary | ICD-10-CM | POA: Diagnosis present

## 2023-10-27 DIAGNOSIS — R29898 Other symptoms and signs involving the musculoskeletal system: Secondary | ICD-10-CM

## 2023-10-27 DIAGNOSIS — Z87891 Personal history of nicotine dependence: Secondary | ICD-10-CM | POA: Diagnosis not present

## 2023-10-27 DIAGNOSIS — I10 Essential (primary) hypertension: Secondary | ICD-10-CM | POA: Diagnosis present

## 2023-10-27 DIAGNOSIS — Z808 Family history of malignant neoplasm of other organs or systems: Secondary | ICD-10-CM | POA: Diagnosis not present

## 2023-10-27 DIAGNOSIS — Z8546 Personal history of malignant neoplasm of prostate: Secondary | ICD-10-CM | POA: Diagnosis not present

## 2023-10-27 DIAGNOSIS — B9620 Unspecified Escherichia coli [E. coli] as the cause of diseases classified elsewhere: Secondary | ICD-10-CM | POA: Diagnosis present

## 2023-10-27 DIAGNOSIS — Z5329 Procedure and treatment not carried out because of patient's decision for other reasons: Secondary | ICD-10-CM | POA: Diagnosis present

## 2023-10-27 DIAGNOSIS — R531 Weakness: Secondary | ICD-10-CM | POA: Diagnosis present

## 2023-10-27 DIAGNOSIS — J449 Chronic obstructive pulmonary disease, unspecified: Secondary | ICD-10-CM | POA: Diagnosis present

## 2023-10-27 DIAGNOSIS — H5462 Unqualified visual loss, left eye, normal vision right eye: Secondary | ICD-10-CM | POA: Diagnosis present

## 2023-10-27 LAB — COMPREHENSIVE METABOLIC PANEL WITH GFR
ALT: 20 U/L (ref 0–44)
AST: 17 U/L (ref 15–41)
Albumin: 3 g/dL — ABNORMAL LOW (ref 3.5–5.0)
Alkaline Phosphatase: 53 U/L (ref 38–126)
Anion gap: 11 (ref 5–15)
BUN: 9 mg/dL (ref 8–23)
CO2: 23 mmol/L (ref 22–32)
Calcium: 8.4 mg/dL — ABNORMAL LOW (ref 8.9–10.3)
Chloride: 104 mmol/L (ref 98–111)
Creatinine, Ser: 0.97 mg/dL (ref 0.61–1.24)
GFR, Estimated: >60 mL/min (ref >60)
Glucose, Bld: 88 mg/dL (ref 70–99)
Potassium: 3.4 mmol/L — ABNORMAL LOW (ref 3.5–5.1)
Sodium: 138 mmol/L (ref 135–145)
Total Bilirubin: 1.2 mg/dL (ref 0.0–1.2)
Total Protein: 6 g/dL — ABNORMAL LOW (ref 6.5–8.1)

## 2023-10-27 LAB — CBC
HCT: 50.6 % (ref 39.0–52.0)
Hemoglobin: 16.6 g/dL (ref 13.0–17.0)
MCH: 32.5 pg (ref 26.0–34.0)
MCHC: 32.8 g/dL (ref 30.0–36.0)
MCV: 99.2 fL (ref 80.0–100.0)
Platelets: 146 10*3/uL — ABNORMAL LOW (ref 150–400)
RBC: 5.1 MIL/uL (ref 4.22–5.81)
RDW: 16.2 % — ABNORMAL HIGH (ref 11.5–15.5)
WBC: 7.1 10*3/uL (ref 4.0–10.5)
nRBC: 0 % (ref 0.0–0.2)

## 2023-10-27 MED ORDER — POTASSIUM CHLORIDE CRYS ER 20 MEQ PO TBCR
40.0000 meq | EXTENDED_RELEASE_TABLET | Freq: Two times a day (BID) | ORAL | Status: AC
  Administered 2023-10-27 (×2): 40 meq via ORAL
  Filled 2023-10-27 (×2): qty 2

## 2023-10-27 NOTE — Plan of Care (Signed)

## 2023-10-27 NOTE — Plan of Care (Signed)

## 2023-10-27 NOTE — Progress Notes (Signed)
 [facility] PROGRESS NOTE    Progress Note  [name]  ZHY:[id number] DOB: 03-03-1961 DOA: 10/26/2023 PCP: Charle Congo, MD     Brief Narrative:   [name] is an 63 y.o. 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    Assessment/Plan:   Generalized weakness possibly due to Urinary tract infection Started on IV fluids and empiric antibiotics. UA was concerning for infectious etiology urine cultures have been sent. PT OT has been consulted.  History of gout: Continue colchicine  allopurinol .  Hypokalemia: Replete orally check in the morning.  GERD: Continue Protonix .  History of CVA/hyperlipidemia: Continue Zocor .  Urinary retention: Continue Toviaz.   DVT prophylaxis: lovenox Family Communication:none Status is: Observation The patient remains OBS appropriate and will d/c before 2 midnights.    Code Status:     Code Status Orders  (From admission, onward)           Start     Ordered   10/26/23 1750  Full code  Continuous       Question:  By:  Answer:  Consent: discussion documented in EHR   10/26/23 1750           Code Status History     This patient has a current code status but no historical code status.      Advance Directive Documentation    Flowsheet Row Most Recent Value  Type of Advance Directive Healthcare Power of Attorney  Pre-existing out of facility DNR order (yellow form or pink MOST form) --  "MOST" Form in Place? --         IV Access:   Peripheral IV   Procedures and diagnostic studies:   DG Chest 1 View Result Date: 10/26/2023 CLINICAL DATA:  SOB EXAM: PORTABLE CHEST 1 VIEW COMPARISON:  07/06/2019. FINDINGS: Cardiac silhouette appears prominent. No pneumonia or pulmonary edema. No pneumothorax or pleural effusion. Aorta is calcified. Convex right  midthoracic scoliosis noted. IMPRESSION: Prominent cardiac silhouette.  Lungs are clear Electronically Signed   By: Donald Eva M.D.   On: 10/26/2023 14:28   CT HEAD WO CONTRAST ( ) Result Date: 10/26/2023 CLINICAL DATA:  Unwitnessed fall today. EXAM: CT HEAD WITHOUT CONTRAST TECHNIQUE: Contiguous axial images were obtained from the base of the skull through the vertex without intravenous contrast. RADIATION DOSE REDUCTION: This exam was performed according to the departmental dose-optimization program which includes automated exposure control, adjustment of the mA and/or kV according to patient size and/or use of iterative reconstruction technique. COMPARISON:  CT head without contrast 08/11/2013 FINDINGS: Brain: Remote infarcts of the right superior temporal gyrus, insular cortex and right lentiform nucleus is stable. Remote lacunar infarcts are present within the thalami bilaterally. A remote lacunar infarct is present the posterior left lentiform nucleus. Periventricular white matter hypoattenuation is mildly advanced for age. No acute infarct, hemorrhage, or mass lesion is present. Deep brain nuclei are otherwise within normal limits. The ventricles are of normal size. No significant extraaxial fluid collection is present. Vascular: Atherosclerotic calcifications are present within the cavernous internal carotid artery and at the dural margin of the left vertebral artery. No hyperdense vessel is present. Skull: Calvarium is intact. No focal lytic or blastic lesions are present. No significant extracranial soft tissue lesion is present. Sinuses/Orbits: Left lens is chronically displaced. Globes and orbits are otherwise within normal limits.  Mild mucosal thickening is present within the anterior ethmoid air cells and lateral right frontal sinus. The paranasal sinuses and mastoid air cells are otherwise clear. IMPRESSION: 1. No acute intracranial abnormality or significant interval change. 2. Remote  infarcts of the right superior temporal gyrus, insular cortex, and right lentiform nucleus. 3. Remote lacunar infarcts of the thalami bilaterally and posterior left lentiform nucleus. 4. Periventricular white matter hypoattenuation is mildly advanced for age. This likely reflects the sequela of chronic microvascular ischemia. Electronically Signed   By: Donald Leas M.D.   On: 10/26/2023 12:09   DG Pelvis 1-2 Views Result Date: 10/26/2023 CLINICAL DATA:  Fall today. EXAM: PELVIS - 1 VIEW COMPARISON:  None Available. FINDINGS: There is no evidence of pelvic fracture or diastasis. No pelvic bone lesions are seen. IMPRESSION: Negative one view pelvis. Electronically Signed   By: Donald Leas M.D.   On: 10/26/2023 12:02   DG Lumbar Spine Complete Result Date: 10/26/2023 CLINICAL DATA:  Weakness.  Fall today. EXAM: LUMBAR SPINE - COMPLETE 4 VIEW COMPARISON:  CT of the abdomen pelvis 10/15/2012. FINDINGS: Mild levoconvex curvature is present in the lumbar spine. Vertebral body heights and AP alignment are normal. Asymmetric left-sided facet degenerative changes are present at L5-S1. Atherosclerotic changes are present in the abdominal aorta without definite aneurysm. Seeds are noted within the prostate gland. IMPRESSION: 1. No acute abnormality. 2. Asymmetric left-sided facet degenerative changes at L5-S1. 3. Aortic atherosclerosis. Electronically Signed   By: Donald Leas M.D.   On: 10/26/2023 12:02     Medical Consultants:   None.   Subjective:    [name] no complaints feels better.  Objective:    Vitals:   10/26/23 1634 10/26/23 1936 10/27/23 0032 10/27/23 0404  BP: 127/85 119/76 111/79 131/76  Pulse: 95 97 96 95  Resp: 16 20 18 18   Temp: 98.6 F (37 C) 98 F (36.7 C) 97.9 F (36.6 C) 97.7 F (36.5 C)  TempSrc:  Oral Axillary   SpO2: 97% 95% 97% 96%  Weight:      Height:       SpO2: 96 %   Intake/Output Summary (Last 24 hours) at 10/27/2023  0948 Last data filed at 10/27/2023 0500 Gross per 24 hour  Intake 100 ml  Output 500 ml  Net -400 ml   Filed Weights   10/26/23 1038  Weight: 77.1 kg    Exam: General exam: In no acute distress. Respiratory system: Good air movement and clear to auscultation. Cardiovascular system: S1 & S2 heard, RRR. No JVD. Gastrointestinal system: Abdomen is nondistended, soft and nontender.  Extremities: No pedal edema. Skin: No rashes, lesions or ulcers Psychiatry: Judgement and insight appear normal. Mood & affect appropriate.    Data Reviewed:    Labs: Basic Metabolic Panel: Recent Labs  Lab 10/26/23 1425 10/27/23 0350  NA 139 138  K 3.6 3.4*  CL 102 104  CO2 30 23  GLUCOSE 85 88  BUN 8 9  CREATININE 0.97 0.97  CALCIUM 8.6* 8.4*   GFR Estimated Creatinine Clearance: 73.7 mL/min (by C-G formula based on SCr of 0.97 mg/dL). Liver Function Tests: Recent Labs  Lab 10/26/23 1425 10/27/23 0350  AST 22 17  ALT 20 20  ALKPHOS 57 53  BILITOT 1.4* 1.2  PROT 6.2* 6.0*  ALBUMIN 3.4* 3.0*   No results for input(s): "LIPASE", "AMYLASE" in the last 168 hours. No results for input(s): "AMMONIA" in the last 168 hours. Coagulation profile No results for input(s): "  INR", "PROTIME" in the last 168 hours. COVID-19 Labs  No results for input(s): "DDIMER", "FERRITIN", "LDH", "CRP" in the last 72 hours.  Lab Results  Component Value Date   SARSCOV2NAA NEGATIVE 11/11/2018    CBC: Recent Labs  Lab 10/26/23 1224 10/27/23 0350  WBC 8.1 7.1  NEUTROABS 5.8  --   HGB 17.9* 16.6  HCT 54.9* 50.6  MCV 99.6 99.2  PLT 148* 146*   Cardiac Enzymes: Recent Labs  Lab 10/26/23 1517  CKTOTAL 220   BNP (last 3 results) No results for input(s): "PROBNP" in the last 8760 hours. CBG: No results for input(s): "GLUCAP" in the last 168 hours. D-Dimer: No results for input(s): "DDIMER" in the last 72 hours. Hgb A1c: No results for input(s): "HGBA1C" in the last 72 hours. Lipid  Profile: No results for input(s): "CHOL", "HDL", "LDLCALC", "TRIG", "CHOLHDL", "LDLDIRECT" in the last 72 hours. Thyroid function studies: No results for input(s): "TSH", "T4TOTAL", "T3FREE", "THYROIDAB" in the last 72 hours.  Invalid input(s): "FREET3" Anemia work up: No results for input(s): "VITAMINB12", "FOLATE", "FERRITIN", "TIBC", "IRON", "RETICCTPCT" in the last 72 hours. Sepsis Labs: Recent Labs  Lab 10/26/23 1224 10/27/23 0350  WBC 8.1 7.1   Microbiology No results found for this or any previous visit (from the past 240 hours).   Medications:    allopurinol   300 mg Oral q morning   baclofen   10 mg Oral TID   colchicine   0.6 mg Oral Daily   enoxaparin (LOVENOX) injection  40 mg Subcutaneous Q24H   fesoterodine  4 mg Oral QHS   losartan  25 mg Oral Daily   pantoprazole   40 mg Oral BID   simvastatin   20 mg Oral QHS   traZODone  50 mg Oral QHS   Continuous Infusions:  cefTRIAXone  (ROCEPHIN )  IV        LOS: 0 days   Macdonald Savoy  [facility]  10/27/2023, 9:48 AM

## 2023-10-27 NOTE — TOC Initial Note (Signed)
 Transition of Care [facility]) - Initial/Assessment Note    Patient Details  Name: [name] MRN: [id number] Date of Birth: 08-20-1960  Transition of [facility]) CM/SW Contact:    Kathryn Parish, RN Phone Number: 10/27/2023, 4:18 PM  Clinical Narrative:                 Pt from home Lives in a house alone with a PCA 7 days a week; DME ([facility], [place], cane) PCP and insurance verified. NO oxygen or SDOH. Pt states has resources to transport home at discharge. TOC follow for progression to discharge.  Expected Discharge Plan: Home/Self Care Barriers to Discharge: Continued Medical Work up   Patient Goals and CMS Choice Patient states their goals for this hospitalization and ongoing recovery are:: Return home   Choice offered to / list presented to : NA      Expected Discharge Plan and Services In-house Referral: NA Discharge Planning Services: CM Consult Post Acute Care Choice: NA Living arrangements for the past 2 months: Single Family Home                 DME Arranged: N/A DME Agency: NA       HH Arranged: NA HH Agency: NA        Prior Living Arrangements/Services Living arrangements for the past 2 months: Single Family Home Lives with:: Self Patient language and need for interpreter reviewed:: Yes Do you feel safe going back to the place where you live?: Yes      Need for Family Participation in Patient Care: Yes (Comment) Care giver support system in place?: Yes (comment) Current home services: Homehealth aide, DME Criminal Activity/Legal Involvement Pertinent to Current Situation/Hospitalization: No - Comment as needed  Activities of Daily Living   ADL Screening (condition at time of admission) Independently performs ADLs?: Yes (appropriate for developmental age) Is the patient deaf or have difficulty hearing?: No Does the patient have difficulty seeing, even when wearing glasses/contacts?: Yes Does the patient have difficulty concentrating, remembering, or making  decisions?: No  Permission Sought/Granted Permission sought to share information with : Case Manager Permission granted to share information with : Yes, Verbal Permission Granted  Share Information with NAME: Bjorn Bullocks     Permission granted to share info w Relationship: niece  Permission granted to share info w Contact Information: [phone]  Emotional Assessment Appearance:: Appears older than stated age Attitude/Demeanor/Rapport: Engaged Affect (typically observed): Appropriate, Accepting Orientation: : Oriented to Self, Oriented to Place, Oriented to  Time, Oriented to Situation Alcohol / Substance Use: Not Applicable Psych Involvement: No (comment)  Admission diagnosis:  Urinary tract infection [N39.0] Acute cystitis without hematuria [N30.00] Weakness of both lower extremities [R29.898] UTI (urinary tract infection) [N39.0] Patient Active Problem List   Diagnosis Date Noted   UTI (urinary tract infection) 10/27/2023   Urinary tract infection 10/26/2023   Post-nasal drainage 11/28/2022   Spastic hemiplegia (HCC) 10/16/2021   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   Spastic hemiparesis of left nondominant side due to acute cerebral infarction (HCC) 04/03/2021   Malignant neoplasm of prostate (HCC) 08/19/2018   PCP:  Charle Congo, MD Pharmacy:   [id number] - [place],  - [address] [place] [address] Phone: [phone] Fax: [phone]     Social Drivers of Health (SDOH) Social History: SDOH Screenings   Food Insecurity: No Food Insecurity (10/26/2023)  Housing: Low Risk  (10/26/2023)  Transportation Needs: No Transportation Needs (10/26/2023)  Utilities: Not At  Risk (10/26/2023)  Depression (PHQ2-9): Low Risk  (07/03/2023)  Social Connections: Moderately Isolated (10/26/2023)  Tobacco Use: High Risk (10/26/2023)   SDOH Interventions:     Readmission Risk Interventions    10/27/2023    4:18 PM  Readmission Risk  Prevention Plan  Post Dischage Appt Complete  Medication Screening Complete  Transportation Screening Complete

## 2023-10-28 ENCOUNTER — Encounter: Admitting: Physical Medicine & Rehabilitation

## 2023-10-28 DIAGNOSIS — R29898 Other symptoms and signs involving the musculoskeletal system: Secondary | ICD-10-CM | POA: Diagnosis not present

## 2023-10-28 DIAGNOSIS — N3000 Acute cystitis without hematuria: Secondary | ICD-10-CM | POA: Diagnosis not present

## 2023-10-28 LAB — BASIC METABOLIC PANEL WITH GFR
Anion gap: 7 (ref 5–15)
BUN: 11 mg/dL (ref 8–23)
CO2: 25 mmol/L (ref 22–32)
Calcium: 8.8 mg/dL — ABNORMAL LOW (ref 8.9–10.3)
Chloride: 106 mmol/L (ref 98–111)
Creatinine, Ser: 0.88 mg/dL (ref 0.61–1.24)
GFR, Estimated: >60 mL/min (ref >60)
Glucose, Bld: 87 mg/dL (ref 70–99)
Potassium: 4 mmol/L (ref 3.5–5.1)
Sodium: 138 mmol/L (ref 135–145)

## 2023-10-28 NOTE — Evaluation (Signed)
 Occupational Therapy Evaluation Patient Details Name: [name] MRN: [id number] DOB: 08-17-1960 Today's Date: 10/28/2023   History of Present Illness   [name] is a 63 yr old male admitted 10/26/23 with past medical history significant for COPD, CVA with left-sided weakness, GERD who started feeling weak on the day of admission he fell in the kitchen and was unable to get up.     Clinical Impressions The pt appears to be at his baseline level of functioning for self-care, as he performed all assessed tasks with modified independence or better, including sit to stand, lower body dressing/doffing and donning socks in sitting, ambulating in his room with a cane, and a toilet transfer at bathroom level. He is not presenting with acute functional deficits that would warrant the need for further OT services. OT will sign off and recommend he return home at discharge.      If plan is discharge home, recommend the following:   Assistance with cooking/housework     Functional Status Assessment   Patient has not had a recent decline in their functional status     Equipment Recommendations   None recommended by OT     Recommendations for Other Services         Precautions/Restrictions   Restrictions Weight Bearing Restrictions Per Provider Order: No Other Position/Activity Restrictions: L sided weakness from a CVA in 2006     Mobility Bed Mobility      General bed mobility comments: pt was received seated in the bedside chair    Transfers Overall transfer level: Modified independent Equipment used: Straight cane Transfers: Sit to/from Stand           Balance     Sitting balance-Leahy Scale: Good       Standing balance-Leahy Scale: Good         ADL either performed or assessed with clinical judgement   ADL Overall ADL's : Modified independent;At baseline               Vision   Additional Comments: 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 Required AAROM to PROM LUE Coordination: decreased fine motor;decreased gross motor   Lower Extremity Assessment LLE Deficits / Details:  (weakness from a prior CVA in 2006)       Communication Communication Communication: No apparent difficulties   Cognition Arousal: Alert Behavior During Therapy: WFL for tasks assessed/performed        OT - Cognition Comments: oriented x4        Following commands: Intact                  Home Living Family/patient expects to be discharged to:: Private residence Living Arrangements: Alone Available Help at Discharge: Personal care attendant Type of Home: House Home Access: Ramped entrance     Home Layout: One level     Bathroom Shower/Tub: Chief Strategy Officer: Handicapped height     Home Equipment: Cane - single point;Shower seat;Toilet riser;Electric scooter          Prior Functioning/Environment Prior Level of Function : Needs assist  Mobility Comments: He used a cane for ambulation. ADLs Comments: He has a in-home personal care attendant 7 days per week for 2-3 hours a day who performed the cleaning and provided min assist for bathing. The pt drives.    OT Problem List: Decreased range of motion;Decreased strength;Decreased coordination;Impaired tone;Impaired UE functional use   OT Treatment/Interventions:   N/A     OT Goals(Current goals can be found in the care plan section)   Acute Rehab OT Goals Patient Stated Goal: to return home soon OT Goal Formulation: All assessment and education complete, DC  therapy   OT Frequency:   N/A       AM-PAC OT "6 Clicks" Daily Activity     Outcome Measure Help from another person eating meals?: None Help from another person taking care of personal grooming?: None Help from another person toileting, which includes using toliet, bedpan, or urinal?: None Help from another person bathing (including washing, rinsing, drying)?: None Help from another person to put on and taking off regular upper body clothing?: None Help from another person to put on and taking off regular lower body clothing?: None 6 Click Score: 24   End of Session Equipment Utilized During Treatment: Other (comment) (cane) Nurse Communication: Mobility status  Activity Tolerance: Patient tolerated treatment well Patient left: in chair;with call bell/phone within reach;with chair alarm set  OT Visit Diagnosis: Muscle weakness (generalized) (M62.81);History of falling (Z91.81)                Time: 1610-9604 OT Time Calculation (min): 19 min Charges:  OT General Charges $OT Visit: 1 Visit OT Evaluation $OT Eval Low Complexity: 1 Low    Querida Beretta L Jovanny Stephanie, OTR/L 10/28/2023, 5:14 PM

## 2023-10-28 NOTE — Progress Notes (Signed)
 [facility] PROGRESS NOTE    Progress Note  [name]  DVV:[id number] DOB: 1960/08/24 DOA: 10/26/2023 PCP: Charle Congo, MD     Brief Narrative:   [name] is an 63 y.o. 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  Assessment/Plan:   Generalized weakness due to Urinary tract infection Continue IV Rocephin . Urine culture grew more than 100,000 colonies of E. coli.  Sensitivity are pending. PT OT eval is pending. Anticipate will need skilled nursing facility placement.  History of gout: Continue colchicine  allopurinol .  Hypokalemia: Replete orally check in the morning.  GERD: Continue Protonix .  History of CVA/hyperlipidemia: Continue Zocor .  Urinary retention: Continue Toviaz.   DVT prophylaxis: lovenox Family Communication:none Status is: Observation The patient remains OBS appropriate and will d/c before 2 midnights.    Code Status:     Code Status Orders  (From admission, onward)           Start     Ordered   10/26/23 1750  Full code  Continuous       Question:  By:  Answer:  Consent: discussion documented in EHR   10/26/23 1750           Code Status History     This patient has a current code status but no historical code status.      Advance Directive Documentation    Flowsheet Row Most Recent Value  Type of Advance Directive Healthcare Power of Attorney  Pre-existing out of facility DNR order (yellow form or pink MOST form) --  "MOST" Form in Place? --         IV Access:   Peripheral IV   Procedures and diagnostic studies:   DG Chest 1 View Result Date: 10/26/2023 CLINICAL DATA:  SOB EXAM: PORTABLE CHEST 1 VIEW COMPARISON:  07/06/2019. FINDINGS: Cardiac silhouette appears prominent. No pneumonia or pulmonary edema. No pneumothorax or pleural effusion. Aorta  is calcified. Convex right midthoracic scoliosis noted. IMPRESSION: Prominent cardiac silhouette.  Lungs are clear Electronically Signed   By: Sydell Eva M.D.   On: 10/26/2023 14:28   CT HEAD WO CONTRAST ( ) Result Date: 10/26/2023 CLINICAL DATA:  Unwitnessed fall today. EXAM: CT HEAD WITHOUT CONTRAST TECHNIQUE: Contiguous axial images were obtained from the base of the skull through the vertex without intravenous contrast. RADIATION DOSE REDUCTION: This exam was performed according to the departmental dose-optimization program which includes automated exposure control, adjustment of the mA and/or kV according to patient size and/or use of iterative reconstruction technique. COMPARISON:  CT head without contrast 08/11/2013 FINDINGS: Brain: Remote infarcts of the right superior temporal gyrus, insular cortex and right lentiform nucleus is stable. Remote lacunar infarcts are present within the thalami bilaterally. A remote lacunar infarct is present the posterior left lentiform nucleus. Periventricular white matter hypoattenuation is mildly advanced for age. No acute infarct, hemorrhage, or mass lesion is present. Deep brain nuclei are otherwise within normal limits. The ventricles are of normal size. No significant extraaxial fluid collection is present. Vascular: Atherosclerotic calcifications are present within the cavernous internal carotid artery and at the dural margin of the left vertebral artery. No hyperdense vessel is present. Skull: Calvarium is intact. No focal lytic or blastic lesions are present. No significant extracranial soft tissue lesion is present. Sinuses/Orbits: Left lens is chronically displaced. Globes and orbits are otherwise  within normal limits. Mild mucosal thickening is present within the anterior ethmoid air cells and lateral right frontal sinus. The paranasal sinuses and mastoid air cells are otherwise clear. IMPRESSION: 1. No acute intracranial abnormality or significant  interval change. 2. Remote infarcts of the right superior temporal gyrus, insular cortex, and right lentiform nucleus. 3. Remote lacunar infarcts of the thalami bilaterally and posterior left lentiform nucleus. 4. Periventricular white matter hypoattenuation is mildly advanced for age. This likely reflects the sequela of chronic microvascular ischemia. Electronically Signed   By: Audree Leas M.D.   On: 10/26/2023 12:09   DG Pelvis 1-2 Views Result Date: 10/26/2023 CLINICAL DATA:  Fall today. EXAM: PELVIS - 1 VIEW COMPARISON:  None Available. FINDINGS: There is no evidence of pelvic fracture or diastasis. No pelvic bone lesions are seen. IMPRESSION: Negative one view pelvis. Electronically Signed   By: Audree Leas M.D.   On: 10/26/2023 12:02   DG Lumbar Spine Complete Result Date: 10/26/2023 CLINICAL DATA:  Weakness.  Fall today. EXAM: LUMBAR SPINE - COMPLETE 4 VIEW COMPARISON:  CT of the abdomen pelvis 10/15/2012. FINDINGS: Mild levoconvex curvature is present in the lumbar spine. Vertebral body heights and AP alignment are normal. Asymmetric left-sided facet degenerative changes are present at L5-S1. Atherosclerotic changes are present in the abdominal aorta without definite aneurysm. Seeds are noted within the prostate gland. IMPRESSION: 1. No acute abnormality. 2. Asymmetric left-sided facet degenerative changes at L5-S1. 3. Aortic atherosclerosis. Electronically Signed   By: Audree Leas M.D.   On: 10/26/2023 12:02     Medical Consultants:   None.   Subjective:    [name] ready to get out of here  Objective:    Vitals:   10/27/23 0404 10/27/23 1232 10/27/23 2036 10/28/23 0537  BP: 131/76 119/76 125/83   Pulse: 95 95 84 99  Resp: 18 18 18 16   Temp: 97.7 F (36.5 C) 97.8 F (36.6 C) 97.8 F (36.6 C) 98.5 F (36.9 C)  TempSrc:  Oral Oral Oral  SpO2: 96% 98% 98% 99%  Weight:      Height:       SpO2: 99 %   Intake/Output Summary (Last 24  hours) at 10/28/2023 0854 Last data filed at 10/28/2023 0537 Gross per 24 hour  Intake 822.78 ml  Output 1550 ml  Net -727.22 ml   Filed Weights   10/26/23 1038  Weight: 77.1 kg    Exam: General exam: In no acute distress. Respiratory system: Good air movement and clear to auscultation. Cardiovascular system: S1 & S2 heard, RRR. No JVD. Gastrointestinal system: Abdomen is nondistended, soft and nontender.  Extremities: No pedal edema. Skin: No rashes, lesions or ulcers Psychiatry: Judgement and insight appear normal. Mood & affect appropriate.  Data Reviewed:    Labs: Basic Metabolic Panel: Recent Labs  Lab 10/26/23 1425 10/27/23 0350 10/28/23 0359  NA 139 138 138  K 3.6 3.4* 4.0  CL 102 104 106  CO2 30 23 25   GLUCOSE 85 88 87  BUN 8 9 11   CREATININE 0.97 0.97 0.88  CALCIUM 8.6* 8.4* 8.8*   GFR Estimated Creatinine Clearance: 81.2 mL/min (by C-G formula based on SCr of 0.88 mg/dL). Liver Function Tests: Recent Labs  Lab 10/26/23 1425 10/27/23 0350  AST 22 17  ALT 20 20  ALKPHOS 57 53  BILITOT 1.4* 1.2  PROT 6.2* 6.0*  ALBUMIN 3.4* 3.0*   No results for input(s): "LIPASE", "AMYLASE" in the last 168 hours. No results for  input(s): "AMMONIA" in the last 168 hours. Coagulation profile No results for input(s): "INR", "PROTIME" in the last 168 hours. COVID-19 Labs  No results for input(s): "DDIMER", "FERRITIN", "LDH", "CRP" in the last 72 hours.  Lab Results  Component Value Date   SARSCOV2NAA NEGATIVE 11/11/2018    CBC: Recent Labs  Lab 10/26/23 1224 10/27/23 0350  WBC 8.1 7.1  NEUTROABS 5.8  --   HGB 17.9* 16.6  HCT 54.9* 50.6  MCV 99.6 99.2  PLT 148* 146*   Cardiac Enzymes: Recent Labs  Lab 10/26/23 1517  CKTOTAL 220   BNP (last 3 results) No results for input(s): "PROBNP" in the last 8760 hours. CBG: No results for input(s): "GLUCAP" in the last 168 hours. D-Dimer: No results for input(s): "DDIMER" in the last 72 hours. Hgb  A1c: No results for input(s): "HGBA1C" in the last 72 hours. Lipid Profile: No results for input(s): "CHOL", "HDL", "LDLCALC", "TRIG", "CHOLHDL", "LDLDIRECT" in the last 72 hours. Thyroid function studies: No results for input(s): "TSH", "T4TOTAL", "T3FREE", "THYROIDAB" in the last 72 hours.  Invalid input(s): "FREET3" Anemia work up: No results for input(s): "VITAMINB12", "FOLATE", "FERRITIN", "TIBC", "IRON", "RETICCTPCT" in the last 72 hours. Sepsis Labs: Recent Labs  Lab 10/26/23 1224 10/27/23 0350  WBC 8.1 7.1   Microbiology Recent Results (from the past 240 hours)  Urine Culture     Status: Abnormal (Preliminary result)   Collection Time: 10/26/23 12:35 PM   Specimen: Urine, Clean Catch  Result Value Ref Range Status   Specimen Description   Final    URINE, CLEAN CATCH Performed at [facility], [address] [place], [place] [address]    Special Requests   Final    NONE Performed at [facility], [address], [place], [place] [address]    Culture (A)  Final    >=100,000 COLONIES/mL ESCHERICHIA COLI CULTURE REINCUBATED FOR BETTER GROWTH Performed at [facility], [address] [place], [place] [address]    Report Status PENDING  Incomplete     Medications:    allopurinol   300 mg Oral q morning   baclofen   10 mg Oral TID   colchicine   0.6 mg Oral Daily   enoxaparin (LOVENOX) injection  40 mg Subcutaneous Q24H   fesoterodine  4 mg Oral QHS   losartan  25 mg Oral Daily   pantoprazole   40 mg Oral BID   simvastatin   20 mg Oral QHS   traZODone  50 mg Oral QHS   Continuous Infusions:  cefTRIAXone  (ROCEPHIN )  IV 2 g (10/27/23 1542)      LOS: 1 day   [name]  [facility]  10/28/2023, 8:54 AM

## 2023-10-28 NOTE — Evaluation (Signed)
 Physical Therapy Evaluation Patient Details Name: [name] MRN: [id number] DOB: 05/30/1961 Today's Date: 10/28/2023  History of Present Illness  [name] is a 63 y.o. 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  Clinical Impression  Pt admitted with above diagnosis. Pt in bed, agreeable to session. States that he has PCA 3-4 hours per day, 7 days per week for assistance with bathing. He had prior CVA in 2006 with residual deficits. Mod I for bed mobility and STS, CGA-sup A for mobility away from EOB. Pt has ramped entrance. Able to amb x200 ft with SPC at CGA fading to sup A. Pt currently with functional limitations due to the deficits listed below (see PT Problem List). Pt will benefit from acute skilled PT to increase their independence and safety with mobility to allow discharge.           If plan is discharge home, recommend the following: A little help with bathing/dressing/bathroom;Assist for transportation;Help with stairs or ramp for entrance   Can travel by private vehicle        Equipment Recommendations None recommended by PT  Recommendations for Other Services       Functional Status Assessment Patient has had a recent decline in their functional status and demonstrates the ability to make significant improvements in function in a reasonable and predictable amount of time.     Precautions / Restrictions Precautions Precautions: Fall Recall of Precautions/Restrictions: Intact Restrictions Weight Bearing Restrictions Per Provider Order: No      Mobility  Bed Mobility Overal bed mobility: Modified Independent                  Transfers Overall transfer level: Modified independent Equipment used: Straight cane               General transfer comment: STS from EOB mod I with SPC  and inc time    Ambulation/Gait Ambulation/Gait assistance: Supervision Gait Distance (Feet): 200 Feet Assistive device: Straight cane Gait Pattern/deviations: Step-to pattern, Decreased weight shift to left, Wide base of support Gait velocity: dec     General Gait Details: Pt uses step to pattern with SPC in RUE, good form and appropriate technique, no LOB, good directional changes  Stairs            Wheelchair Mobility     Tilt Bed    Modified Rankin (Stroke Patients Only)       Balance Overall balance assessment: Needs assistance Sitting-balance support: No upper extremity supported, Feet supported Sitting balance-Leahy Scale: Normal     Standing balance support: No upper extremity supported, During functional activity Standing balance-Leahy Scale: Good                               Pertinent Vitals/Pain Pain Assessment Pain Assessment: No/denies pain    Home Living Family/patient expects to be discharged to:: Private residence Living Arrangements: Alone Available Help at Discharge: Personal care attendant (3-4 hours per day to help with bathing) Type of Home: House Home Access: Ramped entrance       Home Layout: One level Home Equipment: Cane - single point;Grab bars - toilet;Grab bars - tub/shower;Toilet riser;Wheelchair - manual      Prior Function Prior Level of Function : Needs assist  Physical Assist : ADLs (physical)   ADLs (physical): Bathing         Extremity/Trunk Assessment        Lower Extremity Assessment Lower Extremity Assessment: LLE deficits/detail LLE Deficits / Details: PMH CVA with L sided weakness    Cervical / Trunk Assessment Cervical / Trunk Assessment: Normal  Communication   Communication Communication: No apparent difficulties    Cognition Arousal: Alert Behavior During Therapy: WFL for tasks assessed/performed   PT - Cognitive impairments: No apparent impairments                          Following commands: Intact       Cueing Cueing Techniques: Verbal cues     General Comments General comments (skin integrity, edema, etc.): residula deficits from CVA in 2006 on L hemi    Exercises     Assessment/Plan    PT Assessment Patient needs continued PT services  PT Problem List Decreased mobility;Decreased activity tolerance;Decreased balance       PT Treatment Interventions DME instruction;Functional mobility training;Balance training;Patient/family education;Gait training;Therapeutic activities;Therapeutic exercise    PT Goals (Current goals can be found in the Care Plan section)  Acute Rehab PT Goals Patient Stated Goal: return home to his dog PT Goal Formulation: With patient Time For Goal Achievement: 11/11/23 Potential to Achieve Goals: Good    Frequency Min 2X/week     Co-evaluation               AM-PAC PT "6 Clicks" Mobility  Outcome Measure Help needed turning from your back to your side while in a flat bed without using bedrails?: None Help needed moving from lying on your back to sitting on the side of a flat bed without using bedrails?: None Help needed moving to and from a bed to a chair (including a wheelchair)?: A Little Help needed standing up from a chair using your arms (e.g., wheelchair or bedside chair)?: None Help needed to walk in hospital room?: A Little Help needed climbing 3-5 steps with a railing? : A Lot 6 Click Score: 20    End of Session Equipment Utilized During Treatment: Gait belt Activity Tolerance: Patient tolerated treatment well Patient left: in chair;with call bell/phone within reach;with chair alarm set Nurse Communication: Mobility status PT Visit Diagnosis: History of falling (Z91.81);Other abnormalities of gait and mobility (R26.89)    Time: 0272-5366 PT Time Calculation (min) (ACUTE ONLY): 24 min   Charges:   PT Evaluation $PT Eval Low Complexity: 1 Low PT Treatments $Gait Training:  8-22 mins PT General Charges $$ ACUTE PT VISIT: 1 Visit         Darien Eden, PT Acute Rehabilitation Services Office: [phone] 10/28/2023   [name] 10/28/2023, 9:54 AM

## 2023-10-29 LAB — URINE CULTURE: Culture: >=100000 — AB

## 2023-10-29 MED ORDER — CEFADROXIL 500 MG PO CAPS: 1000.0000 mg | ORAL_CAPSULE | Freq: Two times a day (BID) | ORAL | 0 refills | Status: AC

## 2023-10-29 NOTE — TOC Progression Note (Addendum)
 Transition of Care [facility]) - Progression Note    Patient Details  Name: [name] MRN: [id number] Date of Birth: 05/24/1961  [facility] of [facility]) CM/SW Contact  Kathryn Parish, RN Phone Number: 10/29/2023, 9:03 AM  Clinical Narrative:    NCM secure chat with Dr.Ortiz and Karlene Overcast (PT) to request order for outpatient rehab. No HH PT agency will accept pt's payor source. NCM discussed outpatient rehab with pt and is agreeable.   Expected Discharge Plan: Home/Self Care Barriers to Discharge: Continued Medical Work up  Expected Discharge Plan and Services In-house Referral: NA Discharge Planning Services: CM Consult Post Acute Care Choice: NA Living arrangements for the past 2 months: Single Family Home                 DME Arranged: N/A DME Agency: NA       HH Arranged: NA HH Agency: NA         Social Determinants of Health (SDOH) Interventions SDOH Screenings   Food Insecurity: No Food Insecurity (10/26/2023)  Housing: Low Risk  (10/26/2023)  Transportation Needs: No Transportation Needs (10/26/2023)  Utilities: Not At Risk (10/26/2023)  Depression (PHQ2-9): Low Risk  (07/03/2023)  Social Connections: Moderately Isolated (10/26/2023)  Tobacco Use: High Risk (10/26/2023)    Readmission Risk Interventions    10/27/2023    4:18 PM  Readmission Risk Prevention Plan  Post Dischage Appt Complete  Medication Screening Complete  Transportation Screening Complete

## 2023-10-29 NOTE — Progress Notes (Signed)
 Date: 10/29/2023 Patient: [name] Admitted: 10/26/2023 10:35 AM Attending Provider: Audria Leather, MD  [name]  has made the decision to leave  against the advice of Audria Leather, MD.  He has been informed and understands the inherent risks, including death.  He has decided to accept the responsibility for this decision. [name] and all necessary parties have been advised that he may return for further evaluation or treatment. His condition at time of discharge was Stable.  [name] had current vital signs as follows:  Blood pressure 134/84, pulse 89, temperature 98.2 F (36.8 C), resp. rate 18, height 5\' 5"  (1.651 m), weight 77.1 kg, SpO2 90%.   [name] has signed the Leaving Against Medical Advice form prior to leaving the department.   10/29/2023

## 2023-10-29 NOTE — Discharge Summary (Signed)
 [facility] Discharge Summary   Patient: [name] ZOX:[id number]   PCP: Charle Congo, MD DOB: 12-29-1960   Date of admission: 10/26/2023   Date of discharge: 10/29/2023    Discharge Disposition: Patient signed out AMA despite being explained the risks of doing so including worsening medical condition as well as death.   Discharge Diagnoses:  E. coli UTI Generalized weakness History of gout Hypokalemia GERD History of unspecified CVA Hyperlipidemia Urinary retention  Discharge Condition: Guarded   Hospital Course:  63 year old male with history of COPD, unspecified CVA with left-sided weakness, GERD presented with worsening weakness.  He was found to have E. coli UTI and had been on IV Rocephin .  Sensitivities were pending.  Patient decided not to wait for the final sensitivities and left AGAINST MEDICAL ADVICE.  After patient left, E. coli sensitivities from urine cultures are available.  I have sent cefadroxil 1 g twice daily prescription for 4 more days to his pharmacy.  patient left AMA   The results of significant diagnostics from this hospitalization (including imaging, microbiology, ancillary and laboratory) are listed below for reference.    Significant Diagnostic Studies: DG Chest 1 View Result Date: 10/26/2023 CLINICAL DATA:  SOB EXAM: PORTABLE CHEST 1 VIEW COMPARISON:  07/06/2019. FINDINGS: Cardiac silhouette appears prominent. No pneumonia or pulmonary edema. No pneumothorax or pleural effusion. Aorta is calcified. Convex right midthoracic scoliosis noted. IMPRESSION: Prominent cardiac silhouette.  Lungs are clear Electronically Signed   By: Sydell Eva M.D.   On: 10/26/2023 14:28   CT HEAD WO CONTRAST ( ) Result Date: 10/26/2023 CLINICAL DATA:  Unwitnessed fall today. EXAM: CT HEAD WITHOUT CONTRAST TECHNIQUE: Contiguous axial images were obtained from the base of the skull through the vertex without intravenous contrast. RADIATION DOSE REDUCTION:  This exam was performed according to the departmental dose-optimization program which includes automated exposure control, adjustment of the mA and/or kV according to patient size and/or use of iterative reconstruction technique. COMPARISON:  CT head without contrast 08/11/2013 FINDINGS: Brain: Remote infarcts of the right superior temporal gyrus, insular cortex and right lentiform nucleus is stable. Remote lacunar infarcts are present within the thalami bilaterally. A remote lacunar infarct is present the posterior left lentiform nucleus. Periventricular white matter hypoattenuation is mildly advanced for age. No acute infarct, hemorrhage, or mass lesion is present. Deep brain nuclei are otherwise within normal limits. The ventricles are of normal size. No significant extraaxial fluid collection is present. Vascular: Atherosclerotic calcifications are present within the cavernous internal carotid artery and at the dural margin of the left vertebral artery. No hyperdense vessel is present. Skull: Calvarium is intact. No focal lytic or blastic lesions are present. No significant extracranial soft tissue lesion is present. Sinuses/Orbits: Left lens is chronically displaced. Globes and orbits are otherwise within normal limits. Mild mucosal thickening is present within the anterior ethmoid air cells and lateral right frontal sinus. The paranasal sinuses and mastoid air cells are otherwise clear. IMPRESSION: 1. No acute intracranial abnormality or significant interval change. 2. Remote infarcts of the right superior temporal gyrus, insular cortex, and right lentiform nucleus. 3. Remote lacunar infarcts of the thalami bilaterally and posterior left lentiform nucleus. 4. Periventricular white matter hypoattenuation is mildly advanced for age. This likely reflects the sequela of chronic microvascular ischemia. Electronically Signed   By: Audree Leas M.D.   On: 10/26/2023 12:09   DG Pelvis 1-2 Views Result Date:  10/26/2023 CLINICAL DATA:  Fall today. EXAM: PELVIS - 1 VIEW COMPARISON:  None Available.  FINDINGS: There is no evidence of pelvic fracture or diastasis. No pelvic bone lesions are seen. IMPRESSION: Negative one view pelvis. Electronically Signed   By: Audree Leas M.D.   On: 10/26/2023 12:02   DG Lumbar Spine Complete Result Date: 10/26/2023 CLINICAL DATA:  Weakness.  Fall today. EXAM: LUMBAR SPINE - COMPLETE 4 VIEW COMPARISON:  CT of the abdomen pelvis 10/15/2012. FINDINGS: Mild levoconvex curvature is present in the lumbar spine. Vertebral body heights and AP alignment are normal. Asymmetric left-sided facet degenerative changes are present at L5-S1. Atherosclerotic changes are present in the abdominal aorta without definite aneurysm. Seeds are noted within the prostate gland. IMPRESSION: 1. No acute abnormality. 2. Asymmetric left-sided facet degenerative changes at L5-S1. 3. Aortic atherosclerosis. Electronically Signed   By: Audree Leas M.D.   On: 10/26/2023 12:02    Microbiology: Recent Results (from the past 240 hours)  Urine Culture     Status: Abnormal   Collection Time: 10/26/23 12:35 PM   Specimen: Urine, Clean Catch  Result Value Ref Range Status   Specimen Description   Final    URINE, CLEAN CATCH Performed at [facility], [address] [place], [place] [address]    Special Requests   Final    NONE Performed at [facility], [address], [place], [place] [address]    Culture >=100,000 COLONIES/mL ESCHERICHIA COLI (A)  Final   Report Status 10/29/2023 FINAL  Final   Organism ID, Bacteria ESCHERICHIA COLI (A)  Final      Susceptibility   Escherichia coli - MIC*    AMPICILLIN >=32 RESISTANT Resistant     CEFAZOLIN  >=64 RESISTANT Resistant     CEFEPIME <=0.12 SENSITIVE Sensitive     CEFTRIAXONE  1 SENSITIVE Sensitive     CIPROFLOXACIN  <=0.25 SENSITIVE Sensitive     GENTAMICIN <=1 SENSITIVE Sensitive     IMIPENEM  <=0.25 SENSITIVE Sensitive     NITROFURANTOIN 64 INTERMEDIATE Intermediate     TRIMETH/SULFA <=20 SENSITIVE Sensitive     AMPICILLIN/SULBACTAM >=32 RESISTANT Resistant     PIP/TAZO 64 INTERMEDIATE Intermediate ug/mL    * >=100,000 COLONIES/mL ESCHERICHIA COLI     Labs: CBC: Recent Labs  Lab 10/26/23 1224 10/27/23 0350  WBC 8.1 7.1  NEUTROABS 5.8  --   HGB 17.9* 16.6  HCT 54.9* 50.6  MCV 99.6 99.2  PLT 148* 146*   Basic Metabolic Panel: Recent Labs  Lab 10/26/23 1425 10/27/23 0350 10/28/23 0359  NA 139 138 138  K 3.6 3.4* 4.0  CL 102 104 106  CO2 30 23 25   GLUCOSE 85 88 87  BUN 8 9 11   CREATININE 0.97 0.97 0.88  CALCIUM 8.6* 8.4* 8.8*   Liver Function Tests: Recent Labs  Lab 10/26/23 1425 10/27/23 0350  AST 22 17  ALT 20 20  ALKPHOS 57 53  BILITOT 1.4* 1.2  PROT 6.2* 6.0*  ALBUMIN 3.4* 3.0*   No results for input(s): "LIPASE", "AMYLASE" in the last 168 hours. No results for input(s): "AMMONIA" in the last 168 hours. Cardiac Enzymes: Recent Labs  Lab 10/26/23 1517  CKTOTAL 220   BNP (last 3 results) No results for input(s): "BNP" in the last 8760 hours. CBG: No results for input(s): "GLUCAP" in the last 168 hours.   Signed:  Audria Leather  [facility] 10/29/2023, 11:33 AM

## 2023-10-29 NOTE — Plan of Care (Signed)

## 2023-10-29 NOTE — TOC Progression Note (Signed)
 Transition of Care [facility]) - Progression Note    Patient Details  Name: [name] MRN: [id number] Date of Birth: Apr 04, 1961  [facility] of [facility]) CM/SW Contact  Kathryn Parish, RN Phone Number: 10/29/2023, 08:50 AM  Clinical Narrative:    NCM secure chat with  Audria Leather, MD requesting an order for outpatient rehab.   Expected Discharge Plan: Home/Self Care Barriers to Discharge: Continued Medical Work up  Expected Discharge Plan and Services In-house Referral: NA Discharge Planning Services: CM Consult Post Acute Care Choice: NA Living arrangements for the past 2 months: Single Family Home                 DME Arranged: N/A DME Agency: NA       HH Arranged: NA HH Agency: NA         Social Determinants of Health (SDOH) Interventions SDOH Screenings   Food Insecurity: No Food Insecurity (10/26/2023)  Housing: Low Risk  (10/26/2023)  Transportation Needs: No Transportation Needs (10/26/2023)  Utilities: Not At Risk (10/26/2023)  Depression (PHQ2-9): Low Risk  (07/03/2023)  Social Connections: Moderately Isolated (10/26/2023)  Tobacco Use: High Risk (10/26/2023)    Readmission Risk Interventions    10/27/2023    4:18 PM  Readmission Risk Prevention Plan  Post Dischage Appt Complete  Medication Screening Complete  Transportation Screening Complete

## 2023-11-04 ENCOUNTER — Encounter: Attending: Physical Medicine & Rehabilitation | Admitting: Physical Medicine & Rehabilitation

## 2023-11-04 DIAGNOSIS — G8114 Spastic hemiplegia affecting left nondominant side: Secondary | ICD-10-CM | POA: Insufficient documentation

## 2023-11-04 DIAGNOSIS — I639 Cerebral infarction, unspecified: Secondary | ICD-10-CM | POA: Insufficient documentation

## 2023-12-10 ENCOUNTER — Ambulatory Visit: Payer: Medicaid Other | Admitting: Podiatry

## 2023-12-10 ENCOUNTER — Encounter: Payer: Self-pay | Admitting: Podiatry

## 2023-12-10 DIAGNOSIS — M2141 Flat foot [pes planus] (acquired), right foot: Secondary | ICD-10-CM | POA: Diagnosis not present

## 2023-12-10 DIAGNOSIS — E119 Type 2 diabetes mellitus without complications: Secondary | ICD-10-CM | POA: Diagnosis not present

## 2023-12-10 DIAGNOSIS — B351 Tinea unguium: Secondary | ICD-10-CM

## 2023-12-10 DIAGNOSIS — L84 Corns and callosities: Secondary | ICD-10-CM

## 2023-12-10 DIAGNOSIS — M79674 Pain in right toe(s): Secondary | ICD-10-CM | POA: Diagnosis not present

## 2023-12-10 DIAGNOSIS — E1151 Type 2 diabetes mellitus with diabetic peripheral angiopathy without gangrene: Secondary | ICD-10-CM

## 2023-12-10 DIAGNOSIS — M2142 Flat foot [pes planus] (acquired), left foot: Secondary | ICD-10-CM | POA: Diagnosis not present

## 2023-12-10 DIAGNOSIS — M79675 Pain in left toe(s): Secondary | ICD-10-CM

## 2023-12-10 DIAGNOSIS — Q828 Other specified congenital malformations of skin: Secondary | ICD-10-CM

## 2023-12-10 NOTE — Progress Notes (Signed)
 Subjective:  Patient ID: [name], male    DOB: 1961/04/29,  MRN: [id number]  MUSCAB BRENNEMAN presents to clinic today for for annual diabetic foot examination, at risk foot care. Pt has h/o NIDDM with PAD, and callus(es) b/l feet, porokeratotic lesion(s) right lower extremity, and painful mycotic nails. Painful toenails interfere with ambulation. Aggravating factors include wearing enclosed shoe gear. Pain is relieved with periodic professional debridement. Painful callus(es) and porokeratotic lesion(s) are aggravated when weightbearing with and without shoegear. Pain is relieved with periodic professional debridement.  Chief Complaint  Patient presents with   RFC    Rm15 RFC/diabetic/last A1c Unknown/last Blood sugar unknown/last pcp visit 1 month ago   New problem(s): None.   PCP is Charle Congo, MD.  No Known Allergies  Review of Systems: Negative except as noted in the HPI.  Objective: No changes noted in today's physical examination. There were no vitals filed for this visit. [name] is a pleasant 63 y.o. male in NAD. AAO x 3.  Vascular Examination: Capillary refill time immediate b/l. Vascular status intact b/l with palpable pedal pulses. Pedal hair absent b/l. No pain with calf compression b/l. Skin temperature gradient WNL b/l. No cyanosis or clubbing b/l. No ischemia or gangrene noted b/l. Trace edema noted BLE.  Neurological Examination: Sensation grossly intact b/l with 10 gram monofilament.  Dermatological Examination: Pedal skin with normal turgor, texture and tone b/l.  No open wounds. No interdigital macerations.   Toenails 2-5 b/l thick, discolored, elongated with subungual debris and pain on dorsal palpation.   Hyperkeratotic lesion(s) medial IPJ L hallux, medial IPJ R hallux, submet head 5 b/l. No erythema, no edema, no drainage, no fluctuance.  Porokeratotic lesion(s) sub 5th met base right foot. No erythema, no edema, no drainage, no  fluctuance.  Musculoskeletal Examination: Normal muscle strength 5/5 to all lower extremity muscle groups bilaterally. No pain, crepitus or joint limitation noted with ROM b/l LE. Pes planus deformity noted bilateral LE. Patient ambulates with cane assistance.  Radiographs: None  Assessment/Plan: 1. Pain due to onychomycosis of toenails of both feet   2. Callus   3. Porokeratosis   4. Pes planus of both feet   5. Type II diabetes mellitus with peripheral circulatory disorder (HCC)   6. Encounter for diabetic foot exam [facility])    -Patient was evaluated today. All questions/concerns addressed on today's visit. -Diabetic foot examination performed today. -Continue diabetic foot care principles: inspect feet daily, monitor glucose as recommended by PCP and/or Endocrinologist, and follow prescribed diet per PCP, Endocrinologist and/or dietician. -Patient to continue soft, supportive shoe gear daily. -Mycotic toenails 3-5 left foot and 3-5 right foot were debrided in length and girth with sterile nail nippers and dremel without iatrogenic bleeding. -Callus(es) medial IPJ of left great toe, medial IPJ of right great toe, submet head 5 left foot, and submet head 5 right foot pared utilizing sharp debridement with sterile blade without complication or incident. Total number debrided =4. -Porokeratotic lesion(s) sub 5th met base right foot pared and enucleated with sterile currette without incident. Total number of lesions debrided=1. -Patient/POA to call should there be question/concern in the interim.   Return in about 9 weeks (around 02/11/2024).  [name], DPM      [facility] LOCATION: [address]  [place], [place] [address]                   Office [phone]   [facility] LOCATION: [address] [place], [place] [address] Office [phone]

## 2023-12-30 ENCOUNTER — Encounter: Attending: Physical Medicine & Rehabilitation | Admitting: Physical Medicine & Rehabilitation

## 2023-12-30 ENCOUNTER — Encounter: Payer: Self-pay | Admitting: Physical Medicine & Rehabilitation

## 2023-12-30 VITALS — BP 120/81 | HR 99 | Temp 98.3°F | Ht 65.0 in | Wt 174.0 lb

## 2023-12-30 DIAGNOSIS — G8114 Spastic hemiplegia affecting left nondominant side: Secondary | ICD-10-CM | POA: Diagnosis present

## 2023-12-30 DIAGNOSIS — M47817 Spondylosis without myelopathy or radiculopathy, lumbosacral region: Secondary | ICD-10-CM | POA: Diagnosis present

## 2023-12-30 MED ORDER — ONABOTULINUMTOXINA 100 UNITS IJ SOLR
400.0000 [IU] | Freq: Once | INTRAMUSCULAR | Status: AC
  Administered 2023-12-30: 400 [IU] via INTRAMUSCULAR

## 2023-12-30 MED ORDER — SODIUM CHLORIDE (PF) 0.9 % IJ SOLN
8.0000 mL | Freq: Once | INTRAMUSCULAR | Status: AC
  Administered 2023-12-30: 8 mL

## 2023-12-30 NOTE — Progress Notes (Signed)
 Botox  Injection for spasticity using needle EMG guidance  Dilution: 50 Units/ml Indication: Severe spasticity which interferes with ADL,mobility and/or  hygiene and is unresponsive to medication management and other conservative care Informed consent was obtained after describing risks and benefits of the procedure with the patient. This includes bleeding, bruising, infection, excessive weakness, or medication side effects. A REMS form is on file and signed. Needle: 27g 1 needle electrode Number of units per muscle LUE  Biceps 50 medial x2(short head), long head x 1  Brachialis 50 FCR 50 Lumbricals 75 FDP 75 FDS 75 PT 25 All injections were done after obtaining appropriate EMG activity and after negative drawback for blood. The patient tolerated the procedure well. Post procedure instructions were given. A followup appointment was made.

## 2023-12-30 NOTE — Patient Instructions (Signed)

## 2024-01-06 ENCOUNTER — Encounter: Payer: Self-pay | Admitting: Physical Medicine & Rehabilitation

## 2024-01-06 ENCOUNTER — Encounter: Admitting: Physical Medicine & Rehabilitation

## 2024-01-06 VITALS — BP 121/81 | HR 94 | Ht 65.0 in | Wt 176.2 lb

## 2024-01-06 DIAGNOSIS — M47817 Spondylosis without myelopathy or radiculopathy, lumbosacral region: Secondary | ICD-10-CM | POA: Insufficient documentation

## 2024-01-06 DIAGNOSIS — G8114 Spastic hemiplegia affecting left nondominant side: Secondary | ICD-10-CM | POA: Diagnosis not present

## 2024-01-06 NOTE — Progress Notes (Signed)
 Subjective:    Patient ID: [name], male    DOB: 06/21/1961, 63 y.o.   MRN: [id number]  HPI Patient returns today for 2 reasons 1 is to follow-up on the botulinum toxin injection performed 1 week ago.  He has some bruising over the left bicep area but no other postinjection complications.  We discussed that some adjustments were made compared to prior injections and the pectoralis was not injected.  The bicep dose was reduced and lumbricals were injected with 75 units. Other reason for visit is low back pain that has been increasing over the last couple months.  He did have a fall on 10/26/2023 associated with ED visit for UTI.  He did go AMA.  Lumbar x-rays showed left L5-S1 facet degenerative changes.  Pelvic films showed no significant OA and no fractures. 12/30/2023 Biceps 50 medial x2(short head), long head x 1  Brachialis 50 FCR 50 Lumbricals 75 FDP 75 FDS 75 PT 25  09/30/2023 LUE Pecs 25U x 2 clavicular head  Biceps 75 medial x2(short head), long head x 1  Brachialis 50 FCR 50  FDP 75 FDS 75 PT 25 Pain Inventory Average Pain 0 Pain Right Now 0 My pain is na  In the last 24 hours, has pain interfered with the following? General activity 0 Relation with others 0 Enjoyment of life 0 What TIME of day is your pain at its worst? na Sleep (in general) Good  Pain is worse with: na Pain improves with: na Relief from Meds: na  Family History  Problem Relation Age of Onset   Brain cancer Maternal Grandfather    Social History   Socioeconomic History   Marital status: Single    Spouse name: Not on file   Number of children: 0   Years of education: Not on file   Highest education level: Not on file  Occupational History    Comment: disability  Tobacco Use   Smoking status: Every Day    Current packs/day: 0.50    Average packs/day: 0.5 packs/day for 15.0 years (7.5 ttl pk-yrs)    Types: Cigarettes   Smokeless tobacco: Never  Vaping Use   Vaping status: Never  Used  Substance and Sexual Activity   Alcohol use: No   Drug use: No   Sexual activity: Not on file  Other Topics Concern   Not on file  Social History Narrative   Not on file   Social Drivers of Health   Financial Resource Strain: Not on file  Food Insecurity: No Food Insecurity (10/26/2023)   Hunger Vital Sign    Worried About Running Out of Food in the Last Year: Never true    Ran Out of Food in the Last Year: Never true  Transportation Needs: No Transportation Needs (10/26/2023)   PRAPARE - Administrator, Civil Service (Medical): No    Lack of Transportation (Non-Medical): No  Physical Activity: Not on file  Stress: Not on file  Social Connections: Moderately Isolated (10/26/2023)   Social Connection and Isolation Panel    Frequency of Communication with Friends and Family: More than three times a week    Frequency of Social Gatherings with Friends and Family: More than three times a week    Attends Religious Services: More than 4 times per year    Active Member of [employer] or Organizations: No    Attends Banker Meetings: Never    Marital Status: Never married   Past Surgical History:  Procedure Laterality Date   COLONOSCOPY WITH PROPOFOL  N/A 09/27/2015   Procedure: COLONOSCOPY WITH PROPOFOL ;  Surgeon: Elsie Cree, MD;  Location: [facility];  Service: Endoscopy;  Laterality: N/A;   CYSTOSCOPY N/A 10/05/2012   Procedure: CYSTOSCOPY, retrograde and right stent placement;  Surgeon: Ricardo Likens, MD;  Location: [facility];  Service: Urology;  Laterality: N/A;   CYSTOSCOPY WITH RETROGRADE PYELOGRAM, URETEROSCOPY AND STENT PLACEMENT Right 04/13/2014   Procedure: CYSTOSCOPY WITH RETROGRADE PYELOGRAM, URETEROSCOPY AND STENT PLACEMENT, LASER ABLATION OF SCAR TISSUE;  Surgeon: Ricardo Likens, MD;  Location: [facility];  Service: Urology;  Laterality: Right;   EYE SURGERY Left age 89   HOLMIUM LASER APPLICATION Right 04/13/2014   Procedure: HOLMIUM LASER APPLICATION;   Surgeon: Ricardo Likens, MD;  Location: [facility];  Service: Urology;  Laterality: Right;   NEPHROLITHOTOMY Right 10/05/2012   Procedure: NEPHROLITHOTOMY PERCUTANEOUS;  Surgeon: Ricardo Likens, MD;  Location: [facility];  Service: Urology;  Laterality: Right;  WITH CYSTO AND SURGEON ACCESS    NEPHROLITHOTOMY Right 10/07/2012   Procedure: NEPHROLITHOTOMY PERCUTANEOUS SECOND LOOK. Stent exchange,Ureteroscopy ;  Surgeon: Ricardo Likens, MD;  Location: [facility];  Service: Urology;  Laterality: Right;   RADIOACTIVE SEED IMPLANT N/A 11/12/2018   Procedure: RADIOACTIVE SEED IMPLANT/BRACHYTHERAPY IMPLANT;  Surgeon: Likens Ricardo, MD;  Location: [facility];  Service: Urology;  Laterality: N/A;  90 MINS   SPACE OAR INSTILLATION N/A 11/12/2018   Procedure: SPACE OAR INSTILLATION;  Surgeon: Likens Ricardo, MD;  [facility]: [facility];  Service: Urology;  Laterality: N/A;   TOENAIL EXCISION     Past Surgical History:  Procedure Laterality Date   COLONOSCOPY WITH PROPOFOL  N/A 09/27/2015   Procedure: COLONOSCOPY WITH PROPOFOL ;  Surgeon: Elsie Cree, MD;  [facility]: [facility];  Service: Endoscopy;  Laterality: N/A;   CYSTOSCOPY N/A 10/05/2012   Procedure: CYSTOSCOPY, retrograde and right stent placement;  Surgeon: Ricardo Likens, MD;  Location: [facility];  Service: Urology;  Laterality: N/A;   CYSTOSCOPY WITH RETROGRADE PYELOGRAM, URETEROSCOPY AND STENT PLACEMENT Right 04/13/2014   Procedure: CYSTOSCOPY WITH RETROGRADE PYELOGRAM, URETEROSCOPY AND STENT PLACEMENT, LASER ABLATION OF SCAR TISSUE;  Surgeon: Ricardo Likens, MD;  [facility];  Service: Urology;  Laterality: Right;   EYE SURGERY Left age 89   HOLMIUM LASER APPLICATION Right 04/13/2014   Procedure: HOLMIUM LASER APPLICATION;  Surgeon: Ricardo Likens, MD;  [facility];  Service: Urology;  Laterality: Right;   NEPHROLITHOTOMY Right 10/05/2012   Procedure: NEPHROLITHOTOMY PERCUTANEOUS;  Surgeon: Ricardo Likens, MD;  Location: [facility];  Service: Urology;  Laterality:  Right;  WITH CYSTO AND SURGEON ACCESS    NEPHROLITHOTOMY Right 10/07/2012   Procedure: NEPHROLITHOTOMY PERCUTANEOUS SECOND LOOK. 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 alvaro;  oncologist-  dr patrcia   dx 07-20-2017   Renal calculi    Stroke [facility]) sept 26,  20006    hx mini strokes in past, 1 large stroke   Weakness of left side of body 03/2005   cva residual   Weakness of one side of body    L sided weakness after stroke   BP 121/81   Pulse 94   Ht 5' 5 (1.651 m)   Wt 176 lb 3.2 oz (79.9 kg)   SpO2 93%   BMI 29.32 kg/m   Opioid Risk Score:   Fall Risk Score:  `1  Depression screen [facility] 2/9     01/06/2024    9:43 AM 07/03/2023    9:20 AM 11/12/2022    9:21 AM 08/02/2022   10:48 AM 01/29/2022    9:51 AM 11/27/2021   12:02 PM 10/16/2021   11:25 AM  Depression screen PHQ 2/9  Decreased Interest 0 0 0 0 0 0 0  Down, Depressed, Hopeless 0 0 0 0 0 0 0  PHQ - 2 Score 0 0 0 0 0 0 0     Review of Systems  Musculoskeletal:  Positive for gait problem.   Hematological:  Bruises/bleeds easily.       Bruise on left bicep from botox  injection  All other systems reviewed and are negative.      Objective:   Physical Exam  General No acute distress Mood and affect appropriate Extremities without edema except for the dorsum of the left hand which is chronic Tone MAS is 0 at the elbow flexors and wrist flexors the patient does have contracture at the PIPs and lumbricals of the left hand. Left upper extremity strength 3 - at the deltoid trace bicep tricep Left lower limb 3/5 at the hip flexor knee extensor 3 - ankle dorsiflexor Mild tenderness to palpation of the right greater trochanter as well as right gluteal region. Mild tenderness over the lumbar paraspinal. No evidence of spinal deformity He does have good hip internal and external rotation Negative distraction test Negative thigh thrust Negative FABERs Ambulates with a cane short step length favors right lower extremity.      Assessment & Plan:  1.  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  Lumbar facet degeneration no evidence of radiculopathy.  I do think he has a myofascial component to his pain.  His sacroiliac tests were negative.  Will make referral to physical therapy he does have an asymmetric gait pattern which may be causing some of his pain which is related to his chronic left hemiplegia

## 2024-01-06 NOTE — Patient Instructions (Signed)
 Will order PT for low back  Can reduce botox  from 400U ot 300U next time

## 2024-01-20 ENCOUNTER — Encounter: Admitting: Physical Medicine & Rehabilitation

## 2024-02-02 ENCOUNTER — Other Ambulatory Visit: Payer: Self-pay

## 2024-02-02 ENCOUNTER — Ambulatory Visit: Attending: Physical Medicine & Rehabilitation

## 2024-02-02 DIAGNOSIS — I69354 Hemiplegia and hemiparesis following cerebral infarction affecting left non-dominant side: Secondary | ICD-10-CM | POA: Diagnosis present

## 2024-02-02 DIAGNOSIS — G8929 Other chronic pain: Secondary | ICD-10-CM | POA: Insufficient documentation

## 2024-02-02 DIAGNOSIS — M6281 Muscle weakness (generalized): Secondary | ICD-10-CM | POA: Insufficient documentation

## 2024-02-02 DIAGNOSIS — M5442 Lumbago with sciatica, left side: Secondary | ICD-10-CM | POA: Diagnosis present

## 2024-02-02 DIAGNOSIS — M47817 Spondylosis without myelopathy or radiculopathy, lumbosacral region: Secondary | ICD-10-CM | POA: Insufficient documentation

## 2024-02-02 NOTE — Therapy (Signed)
 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   History of gout    History of kidney stones    Hyperlipidemia    Hypertension    Prostate cancer [facility]) urologist-- dr ivar likens;  oncologist-  dr patrcia   dx 07-20-2017   Renal calculi    Stroke (HCC) sept 26,  20006    hx mini strokes in past, 1 large stroke   Weakness of left side of body 03/2005   cva residual   Weakness of one side of body    L sided weakness after stroke   Past Surgical History:  Procedure Laterality Date   COLONOSCOPY WITH PROPOFOL  N/A 09/27/2015   Procedure: COLONOSCOPY WITH PROPOFOL ;  Surgeon: Elsie Cree, MD;  Location: [facility];  Service: Endoscopy;  Laterality: N/A;   CYSTOSCOPY N/A 10/05/2012   Procedure: CYSTOSCOPY, retrograde and right stent placement;  Surgeon: Ricardo likens, MD;  Location: [facility];  Service: Urology;  Laterality: N/A;   CYSTOSCOPY WITH RETROGRADE PYELOGRAM, URETEROSCOPY AND STENT PLACEMENT Right 04/13/2014   Procedure: CYSTOSCOPY WITH RETROGRADE PYELOGRAM, URETEROSCOPY AND STENT PLACEMENT, LASER ABLATION OF SCAR TISSUE;  Surgeon: Ricardo likens, MD;  Location: [facility];   Service: Urology;  Laterality: Right;   EYE SURGERY Left age 1   HOLMIUM LASER APPLICATION Right 04/13/2014   Procedure: HOLMIUM LASER APPLICATION;  Surgeon: Ricardo likens, MD;  Location: [facility];  Service: Urology;  Laterality: Right;   NEPHROLITHOTOMY Right 10/05/2012   Procedure: NEPHROLITHOTOMY PERCUTANEOUS;  Surgeon: Ricardo likens, MD;  Location: [facility];  Service: Urology;  Laterality: Right;  WITH CYSTO AND SURGEON ACCESS    NEPHROLITHOTOMY Right 10/07/2012   Procedure: NEPHROLITHOTOMY PERCUTANEOUS SECOND LOOK. Stent exchange,Ureteroscopy ;  Surgeon: Ricardo likens, MD;  Location: [facility];  Service: Urology;  Laterality: Right;   RADIOACTIVE SEED IMPLANT N/A 11/12/2018   Procedure: RADIOACTIVE SEED IMPLANT/BRACHYTHERAPY IMPLANT;  Surgeon: likens Ricardo, MD;  Location: [facility];  Service: Urology;  Laterality: N/A;  90 MINS   SPACE OAR INSTILLATION N/A 11/12/2018   Procedure: SPACE OAR INSTILLATION;  Surgeon: likens Ricardo, MD;  Location: [facility];  Service: Urology;  Laterality: N/A;   TOENAIL EXCISION     Patient Active Problem List   Diagnosis Date Noted   Lumbosacral spondylosis without myelopathy 01/06/2024   UTI (urinary tract infection) 10/27/2023   Urinary tract infection 10/26/2023   Post-nasal drainage 11/28/2022   Spastic hemiplegia (HCC) 10/16/2021   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   Spastic hemiparesis of left nondominant side due to acute cerebral infarction (HCC)  04/03/2021   Malignant neoplasm of prostate (HCC) 08/19/2018    PCP: Shelda Atlas, MD  REFERRING PROVIDER: Carilyn Barter MD  REFERRING DIAG:Lumbosacral spondylosis without myelopathy : Referral is for low back and buttocks pain , not for hemiplegia   Rationale for Evaluation and Treatment: Rehabilitation  THERAPY DIAG:  Hemiplegia and hemiparesis following cerebral infarction affecting left non-dominant side (HCC) - Plan: PT plan of care cert/re-cert  Muscle weakness (generalized) - Plan: PT plan of care  cert/re-cert  Chronic bilateral low back pain with left-sided sciatica - Plan: PT plan of care cert/re-cert  ONSET DATE: 3- 4 months, April 2025  SUBJECTIVE:                                                                                                                                                                                           SUBJECTIVE STATEMENT: If I sit in my lift chair then I start getting aching in the back of my thighs, and radiates up into my buttocks. I stand up to relieve the pain. It also can happen in the bed.   PERTINENT HISTORY:  H/o of CVA with L hemiparesis, and altered gait.  Referred to PT by physical medicine and rehab MD due to pain B post hips and lower back  PAIN:  Are you having pain? Yes: NPRS scale: 0 to 6 Pain location: L greater than R post hips, radiating up to lumbar region. Pain description: deep aching, goes from base of gluteals up to L side of sacrum and into lower back Aggravating factors: sitting in lift chair, tends to slide down in chair sometimes wakes him at night Relieving factors: standing, walking some, moving around  PRECAUTIONS: Other: blind L eye, low vision R eye  RED FLAGS: None   WEIGHT BEARING RESTRICTIONS: No  FALLS:  Has patient fallen in last 6 months? Yes. Number of falls 1  LIVING ENVIRONMENT: Lives with: lives alone, has home health aide 7 days a week for bathing assistance, meals, house cleaning Lives in: House/apartment Stairs: No Has following equipment at home: Single point cane, uses wheelchair to go to mailbox  OCCUPATION: retired  PLOF: Independent  PATIENT GOALS: ge keep pain  NEXT MD VISIT: 04/01/24  OBJECTIVE:  Note: Objective measures were completed at Evaluation unless otherwise noted.  DIAGNOSTIC FINDINGS:  L sided degenerative changes L 5 S1 from x rays in April 2025  PATIENT SURVEYS:  Modified Oswestry Low Back Pain Disability Questionnaire: 12 / 50 = 24.0 %  COGNITION: Overall  cognitive status: Within functional limits for tasks assessed     SENSATION: WFL  MUSCLE LENGTH: Hamstrings: Right -30  deg; Left -40 deg Thomas test: Right na deg; Left na deg  POSTURE: in standing rounded through thoracic region, R sided lean, L LE weakness and circumducts L LE to clear  PALPATION: Tender along lateral border lower lumbar spine, paraspinals   LUMBAR ROM:   AROM eval  Flexion assessed in supine with B knee to chest  Pain end range in supine, no pain with seated lumbar flexion  Extension na  Right lateral flexion   Left lateral flexion   Right rotation   Left rotation Pain in supine   (Blank rows = not tested)  LOWER EXTREMITY ROM:   all wfl with PROM except:  Passive  Right eval Left eval  Hip flexion    Hip extension    Hip abduction    Hip adduction    Hip internal rotation 15 15  Hip external rotation    Knee flexion    Knee extension    Ankle dorsiflexion    Ankle plantarflexion    Ankle inversion    Ankle eversion     (Blank rows = not tested)  LOWER EXTREMITY MMT:  L LE ankle , knee 3-/5 , hip flexion 3-/5, bridge 3-/5 B   LUMBAR SPECIAL TESTS:  Straight leg raise test: Negative and Slump test: Negative  FUNCTIONAL TESTS:  5 times sit to stand: TBD  GAIT: Distance walked: in clinic 35' at a time  Assistive device utilized: Single point cane Level of assistance: Modified independence Comments: hemiparetic gait, leans to R, circumducts L LE to clear   TREATMENT DATE: 02/02/24:evaluation,                                                                                                                            Instructed 2 ex to incorporate for home , to reduce stiffness L hamstrings and LS region.   PATIENT EDUCATION:  Education details: POC, goals Person educated: Patient Education method: Explanation, Demonstration, Tactile cues, Verbal cues, and Handouts Education comprehension: verbalized understanding and returned  demonstration  HOME EXERCISE PROGRAM: Access Code: [email or web address] URL: [email or web address] Date: 02/02/2024 Prepared by: Horst Ostermiller  Exercises - Supine Lower Trunk Rotation  - 1 x daily - 7 x weekly - 1 sets - 10 reps - Seated Hamstring Stretch with Strap  - 1 x daily - 7 x weekly - 1 sets - 10 reps  ASSESSMENT:  CLINICAL IMPRESSION: Patient is a 63 y.o. male who was evaluated today by physical therapy due to myofascial pain in his lower back and post hips. He does have manu postural changes and asymmetrical movement patterns due to long standing deficits from CVA with L hemiparesis. His pain occurs with prolonged sitting and with lying down and is relieved by movement, activity.  He should be able to improve his pain with the physical therapy guidance on the right therex, manual techniques, and maybe some adaptations to his sitting and sleeping positions.    OBJECTIVE IMPAIRMENTS: Abnormal gait, decreased  activity tolerance, decreased balance, decreased coordination, decreased endurance, decreased knowledge of condition, decreased mobility, decreased ROM, decreased strength, hypomobility, increased fascial restrictions, impaired perceived functional ability, impaired flexibility, impaired tone, improper body mechanics, postural dysfunction, and pain.   ACTIVITY LIMITATIONS: lifting, bending, sitting, sleeping, bathing, dressing, and caring for others  PARTICIPATION LIMITATIONS: meal prep, cleaning, laundry, shopping, and community activity  PERSONAL FACTORS: Age, Fitness, Past/current experiences, Time since onset of injury/illness/exacerbation, and 1-2 comorbidities: h/o CVA with L hemi, hospitalization April due to UTI, COPD are also affecting patient's functional outcome.   REHAB POTENTIAL: Fair due to comorbidities  CLINICAL DECISION MAKING: Evolving/moderate complexity  EVALUATION COMPLEXITY: Moderate   GOALS: Goals reviewed with patient? Yes  SHORT TERM GOALS:  Target date: 2 weeks, 02/16/24  I HEP Baseline: Goal status: INITIAL   LONG TERM GOALS: Target date: 12 weeks, 04/26/24:  Modified Oswestry improve from 12 / 50 = 24.0 % to 4/50 or less Baseline:  Goal status: INITIAL  2.  Improve lumbar flexion and rotation in supine to pain free and wnl Baseline: painful, replicates current symptoms Goal status: INITIAL  3.  Reduce frequency of painful episodes from several times a week to once a week or less  Baseline:  Goal status: INITIAL  4.  Patient knowledgeable in adaptations to his chair and sleeping positions to reduce the pain in his lumbar region Baseline:  Goal status: INITIAL  PLAN:  PT FREQUENCY: 1x/week  PT DURATION: 12 weeks  PLANNED INTERVENTIONS: 97110-Therapeutic exercises, 97530- Therapeutic activity, [id number]- Neuromuscular re-education, 97535- Self Care, [id number]- Manual therapy, [id number]- Gait training, [id number]- Ultrasound, Taping, Joint mobilization, and Moist heat.  PLAN FOR NEXT SESSION: manual stretching hamstrings B, lumbar region, strengthening B gluteals all planes, manual techniques B prox hamstrings and lumbar paraspinals, gluteals   Obryan Radu L Vyolet Sakuma, PT, DPT, OCS 02/02/2024, 12:46 PM

## 2024-02-10 ENCOUNTER — Ambulatory Visit (INDEPENDENT_AMBULATORY_CARE_PROVIDER_SITE_OTHER): Payer: Medicaid Other | Admitting: Podiatry

## 2024-02-10 ENCOUNTER — Encounter: Payer: Self-pay | Admitting: Podiatry

## 2024-02-10 DIAGNOSIS — Q828 Other specified congenital malformations of skin: Secondary | ICD-10-CM | POA: Diagnosis not present

## 2024-02-10 DIAGNOSIS — L84 Corns and callosities: Secondary | ICD-10-CM

## 2024-02-10 DIAGNOSIS — M79674 Pain in right toe(s): Secondary | ICD-10-CM | POA: Diagnosis not present

## 2024-02-10 DIAGNOSIS — M79675 Pain in left toe(s): Secondary | ICD-10-CM | POA: Diagnosis not present

## 2024-02-10 DIAGNOSIS — E1151 Type 2 diabetes mellitus with diabetic peripheral angiopathy without gangrene: Secondary | ICD-10-CM

## 2024-02-10 DIAGNOSIS — B351 Tinea unguium: Secondary | ICD-10-CM

## 2024-02-15 ENCOUNTER — Encounter: Payer: Self-pay | Admitting: Podiatry

## 2024-02-15 NOTE — Progress Notes (Signed)
  Subjective:  Patient ID: [name], male    DOB: 11-28-1960,  MRN: [id number]  [name] presents to clinic today for at risk foot care. Pt has h/o NIDDM with PAD and painful porokeratotic lesion(s) right foot and painful mycotic toenails that limit ambulation. Painful toenails interfere with ambulation. Aggravating factors include wearing enclosed shoe gear. Pain is relieved with periodic professional debridement. Painful porokeratotic lesions are aggravated when weightbearing with and without shoegear. Pain is relieved with periodic professional debridement.  Chief Complaint  Patient presents with   [name]    [name] Diabetic foot care / [name]   New problem(s): None.   PCP is Donald Atlas, MD in the past two months.  No Known Allergies  Review of Systems: Negative except as noted in the HPI.  Objective: No changes noted in today's physical examination. There were no vitals filed for this [name]. [name] is a pleasant 63 y.o. male in NAD. AAO x 3.  Vascular Examination: Capillary refill time immediate b/l. Vascular status intact b/l with palpable pedal pulses. Pedal hair absent b/l. No pain with calf compression b/l. Skin temperature gradient WNL b/l. No cyanosis or clubbing b/l. No ischemia or gangrene noted b/l. Trace edema noted BLE.  Neurological Examination: Sensation grossly intact b/l with 10 gram monofilament.  Dermatological Examination: Pedal skin with normal turgor, texture and tone b/l.  No open wounds. No interdigital macerations.   Toenails 2-5 b/l thick, discolored, elongated with subungual debris and pain on dorsal palpation.   Hyperkeratotic lesion(s) medial IPJ L hallux, medial IPJ R hallux, submet head 5 b/l. No erythema, no edema, no drainage, no fluctuance.  Porokeratotic lesion(s) sub 5th met base right foot. No erythema, no edema, no drainage, no fluctuance.  Musculoskeletal Examination: Normal muscle strength 5/5 to all  lower extremity RLE. Hemiplegia LLE.[name] No pain, crepitus or joint limitation noted with ROM b/l LE. Pes planus deformity noted bilateral LE. Patient ambulates with cane assistance.  Radiographs: None  Assessment/Plan: 1. Pain due to onychomycosis of toenails of both feet   2. Porokeratosis   3. Callus   4. Type II diabetes mellitus with peripheral circulatory disorder (HCC)   -Consent given for treatment as described below: -Continue foot and shoe inspections daily. Monitor blood glucose per PCP/Endocrinologist's recommendations. -Patient to continue soft, supportive shoe gear daily. -Toenails 1-5 b/l were debrided in length and girth with sterile nail nippers and dremel without iatrogenic bleeding.  -Callus(es) medial IPJ of bilateral great toes and submet head 5 b/l pared utilizing rotary bur without complication or incident. Total number pared =4. -Porokeratotic lesion(s) sub 5th met base right foot pared and enucleated with sterile currette without incident. Total number of lesions debrided=1. -Patient/POA to call should there be question/concern in the interim.   Return in about 9 weeks (around 04/13/2024).  [name], DPM      [name] LOCATION: [address] [place] [address]                   Office [phone]   [facility] LOCATION: [address] [place], [place] [address] Office [phone]

## 2024-02-23 ENCOUNTER — Ambulatory Visit: Admitting: Physical Therapy

## 2024-02-23 ENCOUNTER — Ambulatory Visit (INDEPENDENT_AMBULATORY_CARE_PROVIDER_SITE_OTHER)

## 2024-02-23 ENCOUNTER — Ambulatory Visit (INDEPENDENT_AMBULATORY_CARE_PROVIDER_SITE_OTHER): Admitting: Podiatry

## 2024-02-23 DIAGNOSIS — M7752 Other enthesopathy of left foot: Secondary | ICD-10-CM | POA: Diagnosis not present

## 2024-02-23 DIAGNOSIS — M19072 Primary osteoarthritis, left ankle and foot: Secondary | ICD-10-CM | POA: Diagnosis not present

## 2024-02-23 DIAGNOSIS — M7751 Other enthesopathy of right foot: Secondary | ICD-10-CM

## 2024-02-23 NOTE — Patient Instructions (Signed)
 Look for Voltaren gel at the pharmacy over the counter or online (also known as diclofenac 1% gel). Apply to the painful areas 3-4x daily with the supplied dosing card. Allow to dry for 10 minutes before going into socks/shoes                            Contains text generated by [employer].                                 Contains text generated by [employer]bridge.

## 2024-02-23 NOTE — Progress Notes (Signed)
  Subjective:  Patient ID: [name], male    DOB: 09/15/1960,  MRN: [id number]  Chief Complaint  Patient presents with   Foot Pain    RM Patient is here for left foot pain. Patient states know on the lateral side of the left foot. Knot has been present for the last 3 months.     Discussed the use of AI scribe software for clinical note transcription with the patient, who gave verbal consent to proceed.  History of Present Illness [name] is a 63 year old male who presents with left foot pain.  He experiences pain on the lateral side of his left foot, particularly when stepping on hard surfaces such as metal thresholds or the air duct in his hallway. The pain is described as an excruciating pain' that requires him to 'snap it up' or stop moving to alleviate the discomfort. The pain is less severe when walking around in the office or when not stepping on hard surfaces, and it typically occurs when wearing socks rather than shoes.  The pain does not extend underneath the foot and is primarily located on the side. He notes some tension in the area, but it is not as severe as when he puts weight on it. He has been trying to avoid stepping on hard surfaces to prevent the pain from worsening.  No significant pain is reported while walking around the office today.      Objective:    Physical Exam VASCULAR: DP and PT pulse palpable. Foot is warm and well-perfused. Capillary fill time is brisk. DERMATOLOGIC: Normal skin turgor, texture, and temperature. No open lesions, rashes, or ulcerations. NEUROLOGIC: Normal sensation to light touch and pressure. No paresthesias. ORTHOPEDIC: Smooth pain-free range of motion of all examined joints. No ecchymosis or bruising. No gross deformity. Tenderness on the side of the left foot fifth metatarsal base.  He has 3 out of 5 strength in the left lower extremity   No images are attached to the encounter.    Results Radiographs taken today of  the left foot show no fracture there are mild arthritic changes to the fifth metatarsal cuboid joint    Assessment:   1. Arthritis of left foot      Plan:  Patient was evaluated and treated and all questions answered.  Assessment and Plan Assessment & Plan Left foot arthritis Arthritis in the left foot, specifically in the fifth metatarsal cuboid joint , causing pain when stepping on hard surfaces, particularly with socks on. Visible narrowing of the joint space indicates arthritis. No fractures or broken bones are present. The condition is not severe enough to require a walking boot or injections at this time as it only affects him he says when he stepped on a hard surface at a metal threshold barefoot in his socks. - Recommend Tylenol  for pain management as needed. - Advise using Voltaren gel, an over-the-counter arthritis gel, three to four times a day. - Recommend wearing supportive house shoes instead of walking in socks to alleviate pressure on the foot. - Instruct to return for evaluation if pain worsens or persists despite wearing supportive shoes, at which point a cortisone shot may be considered.      Return if symptoms worsen or fail to improve.

## 2024-03-03 ENCOUNTER — Ambulatory Visit: Attending: Physical Medicine & Rehabilitation

## 2024-03-03 ENCOUNTER — Other Ambulatory Visit: Payer: Self-pay

## 2024-03-03 DIAGNOSIS — M6281 Muscle weakness (generalized): Secondary | ICD-10-CM | POA: Insufficient documentation

## 2024-03-03 DIAGNOSIS — I69354 Hemiplegia and hemiparesis following cerebral infarction affecting left non-dominant side: Secondary | ICD-10-CM | POA: Insufficient documentation

## 2024-03-03 DIAGNOSIS — M5442 Lumbago with sciatica, left side: Secondary | ICD-10-CM | POA: Insufficient documentation

## 2024-03-03 DIAGNOSIS — G8929 Other chronic pain: Secondary | ICD-10-CM | POA: Diagnosis present

## 2024-03-03 NOTE — Therapy (Signed)
 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   History of gout    History of kidney stones    Hyperlipidemia    Hypertension    Prostate cancer [facility]) urologist-- dr ivar likens;  oncologist-  dr patrcia   dx 07-20-2017   Renal calculi    Stroke (HCC) sept 26,  20006    hx mini strokes in past, 1 large stroke   Weakness of left side of body 03/2005   cva residual   Weakness of one side of body    L sided weakness after stroke   Past Surgical History:  Procedure Laterality Date   COLONOSCOPY WITH PROPOFOL  N/A 09/27/2015   Procedure: COLONOSCOPY WITH PROPOFOL ;  Surgeon: Elsie Cree, MD;  Location: [facility];  Service: Endoscopy;  Laterality: N/A;   CYSTOSCOPY N/A 10/05/2012   Procedure: CYSTOSCOPY, retrograde and right stent placement;  Surgeon: Ricardo likens, MD;  Location: [facility];  Service: Urology;  Laterality: N/A;   CYSTOSCOPY WITH RETROGRADE PYELOGRAM, URETEROSCOPY AND STENT PLACEMENT Right 04/13/2014   Procedure: CYSTOSCOPY WITH RETROGRADE PYELOGRAM, URETEROSCOPY AND STENT PLACEMENT, LASER ABLATION OF SCAR TISSUE;  Surgeon: Ricardo likens, MD;  Location: [facility];   Service: Urology;  Laterality: Right;   EYE SURGERY Left age 1   HOLMIUM LASER APPLICATION Right 04/13/2014   Procedure: HOLMIUM LASER APPLICATION;  Surgeon: Ricardo likens, MD;  Location: [facility];  Service: Urology;  Laterality: Right;   NEPHROLITHOTOMY Right 10/05/2012   Procedure: NEPHROLITHOTOMY PERCUTANEOUS;  Surgeon: Ricardo likens, MD;  Location: [facility];  Service: Urology;  Laterality: Right;  WITH CYSTO AND SURGEON ACCESS    NEPHROLITHOTOMY Right 10/07/2012   Procedure: NEPHROLITHOTOMY PERCUTANEOUS SECOND LOOK. Stent exchange,Ureteroscopy ;  Surgeon: Ricardo likens, MD;  Location: [facility];  Service: Urology;  Laterality: Right;   RADIOACTIVE SEED IMPLANT N/A 11/12/2018   Procedure: RADIOACTIVE SEED IMPLANT/BRACHYTHERAPY IMPLANT;  Surgeon: likens Ricardo, MD;  Location: [facility];  Service: Urology;  Laterality: N/A;  90 MINS   SPACE OAR INSTILLATION N/A 11/12/2018   Procedure: SPACE OAR INSTILLATION;  Surgeon: likens Ricardo, MD;  Location: [facility];  Service: Urology;  Laterality: N/A;   TOENAIL EXCISION     Patient Active Problem List   Diagnosis Date Noted   Lumbosacral spondylosis without myelopathy 01/06/2024   UTI (urinary tract infection) 10/27/2023   Urinary tract infection 10/26/2023   Post-nasal drainage 11/28/2022   Spastic hemiplegia (HCC) 10/16/2021   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   Spastic hemiparesis of left nondominant side due to acute cerebral  infarction [facility]) 04/03/2021   Malignant neoplasm of prostate (HCC) 08/19/2018    PCP: Shelda Atlas, MD  REFERRING PROVIDER: Carilyn Barter MD  REFERRING DIAG:Lumbosacral spondylosis without myelopathy : Referral is for low back and buttocks pain , not for hemiplegia   Rationale for Evaluation and Treatment: Rehabilitation  THERAPY DIAG:  Hemiplegia and hemiparesis following cerebral infarction affecting left non-dominant side (HCC)  Muscle weakness (generalized)  Chronic bilateral low back pain with left-sided  sciatica  ONSET DATE: 3- 4 months, April 2025  SUBJECTIVE:                                                                                                                                                                                           SUBJECTIVE STATEMENT: Patient indicates he lost his home health aide that he had 7 days a week when he turned 63.  He is frustrated today because he was diagnosed with arthritis L foot, and he doesn't think any exercise is going to help his with his pain at home.  Wants to stop PT after today's visit PERTINENT HISTORY:  H/o of CVA with L hemiparesis, and altered gait.  Referred to PT by physical medicine and rehab MD due to pain B post hips and lower back  PAIN:  Are you having pain? Yes: NPRS scale: 0 to 6 Pain location: L greater than R post hips, radiating up to lumbar region. Pain description: deep aching, goes from base of gluteals up to L side of sacrum and into lower back Aggravating factors: sitting in lift chair, tends to slide down in chair sometimes wakes him at night Relieving factors: standing, walking some, moving around  PRECAUTIONS: Other: blind L eye, low vision R eye  RED FLAGS: None   WEIGHT BEARING RESTRICTIONS: No  FALLS:  Has patient fallen in last 6 months? Yes. Number of falls 1  LIVING ENVIRONMENT: Lives with: lives alone, has home health aide 7 days a week for bathing assistance, meals, house cleaning Lives in: House/apartment Stairs: No Has following equipment at home: Single point cane, uses wheelchair to go to mailbox  OCCUPATION: retired  PLOF: Independent  PATIENT GOALS: get rid of  pain thighs , hips  NEXT MD VISIT: 04/01/24  OBJECTIVE:  Note: Objective measures were completed at Evaluation unless otherwise noted.  DIAGNOSTIC FINDINGS:  L sided degenerative changes L 5 S1 from x rays in April 2025  PATIENT SURVEYS:  Modified Oswestry Low Back Pain Disability Questionnaire: 12 / 50 = 24.0  %  COGNITION: Overall cognitive status: Within functional limits for tasks assessed     SENSATION: WFL  MUSCLE LENGTH: Hamstrings:  Right -30 deg; Left -40 deg Thomas test: Right na deg; Left na deg  POSTURE: in standing rounded through thoracic region, R sided lean, L LE weakness and circumducts L LE to clear  PALPATION: Tender along lateral border lower lumbar spine, paraspinals   LUMBAR ROM:   AROM eval  Flexion assessed in supine with B knee to chest  Pain end range in supine, no pain with seated lumbar flexion  Extension na  Right lateral flexion   Left lateral flexion   Right rotation   Left rotation Pain in supine   (Blank rows = not tested)  LOWER EXTREMITY ROM:   all wfl with PROM except:  Passive  Right eval Left eval  Hip flexion    Hip extension    Hip abduction    Hip adduction    Hip internal rotation 15 15  Hip external rotation    Knee flexion    Knee extension    Ankle dorsiflexion    Ankle plantarflexion    Ankle inversion    Ankle eversion     (Blank rows = not tested)  LOWER EXTREMITY MMT:  L LE ankle , knee 3-/5 , hip flexion 3-/5, bridge 3-/5 B   LUMBAR SPECIAL TESTS:  Straight leg raise test: Negative and Slump test: Negative  FUNCTIONAL TESTS:  5 times sit to stand: TBD  GAIT: Distance walked: in clinic 42' at a time  Assistive device utilized: Single point cane Level of assistance: Modified independence Comments: hemiparetic gait, leans to R, circumducts L LE to clear   TREATMENT DATE:  03/03/24:  Nustep L 4 x 6 min R UE, B LE's Supine for manual stretching each leg, hamstrings, piriformis, manually resisted light hip abd/add, IR/ER Lower trunk rotation , which is part of his home program,  Seated, reviewed and had pt practice hamstring stretch for each leg,  needed cues to hinge through hips and position strap around forefoot. Standing for B calf stretch on wedge, 15 sec holds, 4 x    02/02/24:evaluation,                                                                                                                             Instructed 2 ex to incorporate for home , to reduce stiffness L hamstrings and LS region.   PATIENT EDUCATION:  Education details: POC, goals Person educated: Patient Education method: Explanation, Demonstration, Tactile cues, Verbal cues, and Handouts Education comprehension: verbalized understanding and returned demonstration  HOME EXERCISE PROGRAM: Access Code: [email or web address] URL: [email or web address] Date: 02/02/2024 Prepared by: Javius Sylla  Exercises - Supine Lower Trunk Rotation  - 1 x daily - 7 x weekly - 1 sets - 10 reps - Seated Hamstring Stretch with Strap  - 1 x daily - 7 x weekly - 1 sets - 10 reps  ASSESSMENT:  CLINICAL IMPRESSION: Patient is a 63 y.o. male who attended his second  physical therapy due to myofascial pain in his  lower back and post hips. It has been a month since his initial visit.  He reports performing the initial stretches, without much relief.  He is not interested in pursuing further PT, wants to seek second opinion from another MD.  He also reports losing his home health aide whom was assisting him 7 days a week , due to change in his insurance. Will dc today from skilled PT  OBJECTIVE IMPAIRMENTS: Abnormal gait, decreased activity tolerance, decreased balance, decreased coordination, decreased endurance, decreased knowledge of condition, decreased mobility, decreased ROM, decreased strength, hypomobility, increased fascial restrictions, impaired perceived functional ability, impaired flexibility, impaired tone, improper body mechanics, postural dysfunction, and pain.   ACTIVITY LIMITATIONS: lifting, bending, sitting, sleeping, bathing, dressing, and caring for others  PARTICIPATION LIMITATIONS: meal prep, cleaning, laundry, shopping, and community activity  PERSONAL FACTORS: Age, Fitness, Past/current experiences, Time since onset of  injury/illness/exacerbation, and 1-2 comorbidities: h/o CVA with L hemi, hospitalization April due to UTI, COPD are also affecting patient's functional outcome.   REHAB POTENTIAL: Fair due to comorbidities  CLINICAL DECISION MAKING: Evolving/moderate complexity  EVALUATION COMPLEXITY: Moderate   GOALS: Goals reviewed with patient? Yes  SHORT TERM GOALS: Target date: 2 weeks, 02/16/24  I HEP Baseline: Goal status: INITIAL   LONG TERM GOALS: Target date: 12 weeks, 04/26/24:  Modified Oswestry improve from 12 / 50 = 24.0 % to 4/50 or less Baseline:  Goal status: 03/03/24: declined:  Modified Oswestry Low Back Pain Disability Questionnaire: 36 / 50 = 72.0 %  2.  Improve lumbar flexion and rotation in supine to pain free and wnl Baseline: painful, replicates current symptoms Goal status: 03/03/24:in supine tolerated full range for LTR today    3.  Reduce frequency of painful episodes from several times a week to once a week or less  Baseline:  Goal status:  03/03/24: pain most days  4.  Patient knowledgeable in adaptations to his chair and sleeping positions to reduce the pain in his lumbar region Baseline:  Goal status:  03/03/24: states using extra pillow behind lumbar region in sitting when in recliner PLAN:  PT FREQUENCY: 1x/week  PT DURATION: 12 weeks  PLANNED INTERVENTIONS: 97110-Therapeutic exercises, 97530- Therapeutic activity, 97112- Neuromuscular re-education, 97535- Self Care, 02859- Manual therapy, [id number]- Gait training, [id number]- Ultrasound, Taping, Joint mobilization, and Moist heat.  PLAN FOR NEXT SESSION: Discharge  Aniketh Huberty L Jakeb Lamping, PT, DPT, OCS 03/03/2024, 2:29 PM

## 2024-03-08 ENCOUNTER — Ambulatory Visit: Admitting: Physical Therapy

## 2024-03-15 ENCOUNTER — Ambulatory Visit

## 2024-04-01 ENCOUNTER — Encounter: Admitting: Physical Medicine & Rehabilitation

## 2024-04-13 ENCOUNTER — Ambulatory Visit: Admitting: Podiatry

## 2024-04-13 DIAGNOSIS — L84 Corns and callosities: Secondary | ICD-10-CM

## 2024-04-13 DIAGNOSIS — B351 Tinea unguium: Secondary | ICD-10-CM

## 2024-04-13 DIAGNOSIS — M79675 Pain in left toe(s): Secondary | ICD-10-CM | POA: Diagnosis not present

## 2024-04-13 DIAGNOSIS — E1151 Type 2 diabetes mellitus with diabetic peripheral angiopathy without gangrene: Secondary | ICD-10-CM | POA: Diagnosis not present

## 2024-04-13 DIAGNOSIS — M79674 Pain in right toe(s): Secondary | ICD-10-CM

## 2024-04-13 DIAGNOSIS — Q828 Other specified congenital malformations of skin: Secondary | ICD-10-CM

## 2024-04-13 NOTE — Progress Notes (Signed)
  Subjective:  Patient ID: [name], male    DOB: 09-23-1960,  MRN: [id number]  [name] presents to clinic today for at risk foot care. Pt has h/o NIDDM with PAD and callus(es) of both feet, porokeratotic lesion(s) right foot, and painful mycotic nails. Painful toenails interfere with ambulation. Aggravating factors include wearing enclosed shoe gear. Pain is relieved with periodic professional debridement. Painful callus(es) and porokeratotic lesion(s) are aggravated when weightbearing with and without shoegear. Pain is relieved with periodic professional debridement.   New problem(s): None.   PCP is Shelda Atlas, MD. [name] , 2025.  No Known Allergies  Review of Systems: Negative except as noted in the HPI.  Objective:  There were no vitals filed for this visit. [name] is a pleasant 63 y.o. male in NAD. AAO x 3.  Vascular Examination: Capillary refill time immediate b/l. Vascular status intact b/l with palpable pedal pulses. Pedal hair absent b/l. No pain with calf compression b/l. Skin temperature gradient WNL b/l. No cyanosis or clubbing b/l. No ischemia or gangrene noted b/l. Trace edema noted BLE.  Neurological Examination: Sensation grossly intact b/l with 10 gram monofilament.  Dermatological Examination: Pedal skin with normal turgor, texture and tone b/l.  No open wounds. No interdigital macerations.   Toenails 2-5 b/l thick, discolored, elongated with subungual debris and pain on dorsal palpation.   Hyperkeratotic lesion(s) distal tip left 2nd toe, medial IPJ R hallux, submet head 5 b/l. No erythema, no edema, no drainage, no fluctuance.  Porokeratotic lesion(s) plantar right heelpad, sub 5th met base right foot. No erythema, no edema, no drainage, no fluctuance.  Musculoskeletal Examination: Normal muscle strength 5/5 to all lower extremity RLE. Hemiplegia LLE.[name] No pain, crepitus or joint limitation noted with ROM b/l LE. Pes planus deformity  noted bilateral LE. Patient ambulates with cane assistance.  Radiographs: None  Assessment/Plan: 1. Pain due to onychomycosis of toenails of both feet   2. Porokeratosis   3. Callus   4. Type II diabetes mellitus with peripheral circulatory disorder (HCC)     -Consent given for treatment as described below: -Examined patient. -Patient to continue soft, supportive shoe gear daily. -Toenails were debrided in length and girth 3-5 bilaterally with sterile nail nippers and dremel without iatrogenic bleeding.  -Callus(es) distal tip of left 2nd toe, medial IPJ of right great toe, and submet head 5 b/l pared utilizing sterile scalpel blade without complication or incident. Total number debrided =4. -Porokeratotic lesion(s) plantar heel pad of right foot and sub 5th met base right foot pared and enucleated with sterile currette without incident. Total number of lesions debrided=2. -Patient/POA to call should there be question/concern in the interim.   Return in about 9 weeks (around 06/15/2024).  [name], DPM      [place] LOCATION: [address], [place] [address]                   Office [phone]   [facility] LOCATION: [address] [place], [place] [address] Office [phone]

## 2024-04-15 ENCOUNTER — Encounter: Payer: Self-pay | Admitting: Physical Medicine & Rehabilitation

## 2024-04-15 ENCOUNTER — Encounter: Attending: Physical Medicine & Rehabilitation | Admitting: Physical Medicine & Rehabilitation

## 2024-04-15 VITALS — BP 134/86 | HR 89 | Ht 65.0 in | Wt 170.0 lb

## 2024-04-15 DIAGNOSIS — G8114 Spastic hemiplegia affecting left nondominant side: Secondary | ICD-10-CM | POA: Insufficient documentation

## 2024-04-15 MED ORDER — ONABOTULINUMTOXINA 100 UNITS IJ SOLR
400.0000 [IU] | Freq: Once | INTRAMUSCULAR | Status: AC
  Administered 2024-04-15: 400 [IU] via INTRAMUSCULAR

## 2024-04-15 MED ORDER — SODIUM CHLORIDE (PF) 0.9 % IJ SOLN
8.0000 mL | Freq: Once | INTRAMUSCULAR | Status: AC
  Administered 2024-04-15: 8 mL via INTRAVENOUS

## 2024-04-15 NOTE — Progress Notes (Signed)
 Botox  Injection for spasticity using needle EMG guidance  Dilution: 50 Units/ml Indication: Severe spasticity which interferes with ADL,mobility and/or  hygiene and is unresponsive to medication management and other conservative care Informed consent was obtained after describing risks and benefits of the procedure with the patient. This includes bleeding, bruising, infection, excessive weakness, or medication side effects. A REMS form is on file and signed. Needle: 27g 1 needle electrode Number of units per muscle Biceps 50 medial x2(short head), long head x 1  Brachialis 50=150 FCR 50  FDP 75 FDS 50 PT 50  All injections were done after obtaining appropriate EMG activity and after negative drawback for blood. The patient tolerated the procedure well. Post procedure instructions were given. A followup appointment was made.

## 2024-04-15 NOTE — Patient Instructions (Signed)

## 2024-04-18 ENCOUNTER — Encounter: Payer: Self-pay | Admitting: Podiatry

## 2024-06-22 ENCOUNTER — Encounter: Payer: Self-pay | Admitting: Podiatry

## 2024-06-22 ENCOUNTER — Ambulatory Visit: Admitting: Podiatry

## 2024-06-22 DIAGNOSIS — M79674 Pain in right toe(s): Secondary | ICD-10-CM

## 2024-06-22 DIAGNOSIS — L84 Corns and callosities: Secondary | ICD-10-CM | POA: Diagnosis not present

## 2024-06-22 DIAGNOSIS — M79675 Pain in left toe(s): Secondary | ICD-10-CM

## 2024-06-22 DIAGNOSIS — E1151 Type 2 diabetes mellitus with diabetic peripheral angiopathy without gangrene: Secondary | ICD-10-CM

## 2024-06-22 DIAGNOSIS — B351 Tinea unguium: Secondary | ICD-10-CM

## 2024-06-22 DIAGNOSIS — Q828 Other specified congenital malformations of skin: Secondary | ICD-10-CM | POA: Diagnosis not present

## 2024-06-22 NOTE — Progress Notes (Signed)
 " Subjective:  Patient ID: [name], male    DOB: 04/07/1961,  MRN: [id number]  [name] presents to clinic today for at risk foot care. Pt has h/o NIDDM with PAD and painful porokeratotic lesion(s) right lower extremity and painful mycotic toenails that limit ambulation. Painful toenails interfere with ambulation. Aggravating factors include wearing enclosed shoe gear. Pain is relieved with periodic professional debridement. Painful porokeratotic lesions are aggravated when weightbearing with and without shoegear. Pain is relieved with periodic professional debridement.  Chief Complaint  Patient presents with   RFC     RFC and callus care. LO with PCP 03/2024.   New problem(s): None.   PCP is Shelda Atlas, MD.  Allergies[1]  Review of Systems: Negative except as noted in the HPI.  Objective: No changes noted in today's physical examination. There were no vitals filed for this visit. [name] is a pleasant 63 y.o. male in NAD. AAO x 3.  Vascular Examination: Capillary refill time immediate b/l. Vascular status intact b/l with palpable pedal pulses. Pedal hair absent b/l. No pain with calf compression b/l. Skin temperature gradient WNL b/l. No cyanosis or clubbing b/l. No ischemia or gangrene noted b/l. Trace edema noted BLE.  Neurological Examination: Sensation grossly intact b/l with 10 gram monofilament.  Dermatological Examination: Pedal skin with normal turgor, texture and tone b/l.  No open wounds. No interdigital macerations.   Toenails 2-5 b/l thick, discolored, elongated with subungual debris and pain on dorsal palpation.   Hyperkeratotic lesion(s) distal tip left 2nd toe, medial IPJ R hallux, submet head 5 b/l. No erythema, no edema, no drainage, no fluctuance.  Porokeratotic lesion(s) plantar right heelpad, sub 5th met base right foot. No erythema, no edema, no drainage, no fluctuance.  Musculoskeletal Examination: Normal muscle strength 5/5 to all  lower extremity RLE. Hemiplegia LLE.[name] No pain, crepitus or joint limitation noted with ROM b/l LE. Pes planus deformity noted bilateral LE. Patient ambulates with cane assistance.  Assessment/Plan: 1. Pain due to onychomycosis of toenails of both feet   2. Porokeratosis   3. Corns and callosities   4. Type II diabetes mellitus with peripheral circulatory disorder [facility])     -Patient was evaluated today. All questions/concerns addressed on today's visit. -Examined patient. -Continue foot and shoe inspections daily. Monitor blood glucose per PCP/Endocrinologist's recommendations. -Patient to continue soft, supportive shoe gear daily.  -Treatment was provided by assistant [name] under my supervision.  -Mycotic toenails 2-5 bilaterally were debrided in length and girth with sterile nail nippers and dremel without iatrogenic bleeding. -Corn(s) distal tip of left 2nd toe pared utilizing sterile scalpel blade without complication or incident. Total number debrided=1. -Callus(es) medial IPJ of right great toe and submet head 5 b/l pared utilizing sterile scalpel blade without complication or incident. Total number debrided =3. -Porokeratotic lesion(s) plantar heel pad of right foot and sub 5th met base right foot pared and enucleated with sterile currette without incident. Total number of lesions debrided=2. -Dispensed toe crest for left 2nd toe. Apply every morning. Remove every evening. -Patient/POA to call should there be question/concern in the interim.   Return in about 9 weeks (around 08/24/2024).  [name], DPM      [place] LOCATION: [address]  [place], [place] [address]                   Office [phone]   [name] LOCATION: [address] [place], [place] [address] Office [phone]      [1] No Known Allergies  "

## 2024-07-13 ENCOUNTER — Encounter: Attending: Physical Medicine & Rehabilitation | Admitting: Physical Medicine & Rehabilitation

## 2024-07-13 DIAGNOSIS — G8114 Spastic hemiplegia affecting left nondominant side: Secondary | ICD-10-CM | POA: Insufficient documentation

## 2024-08-24 ENCOUNTER — Ambulatory Visit: Admitting: Podiatry

## 2024-10-19 ENCOUNTER — Encounter: Admitting: Physical Medicine & Rehabilitation

## 2024-11-16 ENCOUNTER — Ambulatory Visit: Admitting: Podiatry

## 2025-01-18 ENCOUNTER — Encounter: Admitting: Physical Medicine & Rehabilitation
